# Patient Record
Sex: Female | Born: 1980
Health system: Southern US, Community
[De-identification: ages and names within clinical notes are randomized; demographics above are authoritative.]

## PROBLEM LIST (undated history)

## (undated) ENCOUNTER — Inpatient Hospital Stay (HOSPITAL_COMMUNITY): Payer: Self-pay

## (undated) DIAGNOSIS — J329 Chronic sinusitis, unspecified: Secondary | ICD-10-CM

## (undated) DIAGNOSIS — F319 Bipolar disorder, unspecified: Secondary | ICD-10-CM

## (undated) DIAGNOSIS — M1990 Unspecified osteoarthritis, unspecified site: Secondary | ICD-10-CM

## (undated) DIAGNOSIS — F419 Anxiety disorder, unspecified: Secondary | ICD-10-CM

## (undated) DIAGNOSIS — E039 Hypothyroidism, unspecified: Secondary | ICD-10-CM

## (undated) DIAGNOSIS — G5600 Carpal tunnel syndrome, unspecified upper limb: Secondary | ICD-10-CM

## (undated) DIAGNOSIS — Z3400 Encounter for supervision of normal first pregnancy, unspecified trimester: Secondary | ICD-10-CM

## (undated) DIAGNOSIS — F329 Major depressive disorder, single episode, unspecified: Secondary | ICD-10-CM

## (undated) DIAGNOSIS — K589 Irritable bowel syndrome without diarrhea: Secondary | ICD-10-CM

## (undated) DIAGNOSIS — K219 Gastro-esophageal reflux disease without esophagitis: Secondary | ICD-10-CM

## (undated) DIAGNOSIS — F32A Depression, unspecified: Secondary | ICD-10-CM

## (undated) HISTORY — PX: OTHER SURGICAL HISTORY: SHX169

---
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
## 2004-11-29 ENCOUNTER — Other Ambulatory Visit: Admission: RE | Admit: 2004-11-29 | Discharge: 2004-11-29 | Payer: Self-pay | Admitting: Internal Medicine

## 2005-11-02 ENCOUNTER — Other Ambulatory Visit: Admission: RE | Admit: 2005-11-02 | Discharge: 2005-11-02 | Payer: Self-pay | Admitting: Internal Medicine

## 2011-01-05 LAB — OB RESULTS CONSOLE ABO/RH

## 2011-01-05 LAB — OB RESULTS CONSOLE ANTIBODY SCREEN: Antibody Screen: NEGATIVE

## 2011-01-05 LAB — OB RESULTS CONSOLE HIV ANTIBODY (ROUTINE TESTING): HIV: NONREACTIVE

## 2011-01-05 LAB — OB RESULTS CONSOLE HEPATITIS B SURFACE ANTIGEN: Hepatitis B Surface Ag: NEGATIVE

## 2011-03-20 NOTE — L&D Delivery Note (Signed)
Delivery Note At 2:00 AM a viable female was delivered via Vaginal, Spontaneous Delivery (Presentation: OA;LOT ).  APGAR: 8, 9; weight 8 lb 3.6 oz (3731 g).   Placenta status: Intact, Spontaneous.  Cord: 3 vessels with the following complications: None.   Anesthesia: Local  Episiotomy: none Lacerations: B labial, 2nd degree perineal, B periurethral Suture Repair: 3.0 vicryl rapide Est. Blood Loss (mL): 500cc  Mom to postpartum.  Baby to nursery-stable.  [name],Stephanie Collazos 07/28/2011, 2:39 AM   A+ Samuella Cota

## 2011-04-06 ENCOUNTER — Ambulatory Visit (INDEPENDENT_AMBULATORY_CARE_PROVIDER_SITE_OTHER): Payer: BC Managed Care – PPO

## 2011-04-06 DIAGNOSIS — J019 Acute sinusitis, unspecified: Secondary | ICD-10-CM

## 2011-04-06 DIAGNOSIS — Z331 Pregnant state, incidental: Secondary | ICD-10-CM

## 2011-04-20 ENCOUNTER — Inpatient Hospital Stay (HOSPITAL_COMMUNITY)
Admission: AD | Admit: 2011-04-20 | Discharge: 2011-04-20 | Disposition: A | Discharge status: Home / Self Care | Payer: BC Managed Care – PPO | Source: Ambulatory Visit | Attending: Obstetrics and Gynecology | Admitting: Obstetrics and Gynecology

## 2011-04-20 ENCOUNTER — Encounter (HOSPITAL_COMMUNITY): Payer: Self-pay | Admitting: *Deleted

## 2011-04-20 DIAGNOSIS — R109 Unspecified abdominal pain: Secondary | ICD-10-CM | POA: Insufficient documentation

## 2011-04-20 DIAGNOSIS — N949 Unspecified condition associated with female genital organs and menstrual cycle: Secondary | ICD-10-CM

## 2011-04-20 DIAGNOSIS — O99891 Other specified diseases and conditions complicating pregnancy: Secondary | ICD-10-CM | POA: Insufficient documentation

## 2011-04-20 HISTORY — DX: Major depressive disorder, single episode, unspecified: F32.9

## 2011-04-20 HISTORY — DX: Gastro-esophageal reflux disease without esophagitis: K21.9

## 2011-04-20 HISTORY — DX: Anxiety disorder, unspecified: F41.9

## 2011-04-20 HISTORY — DX: Depression, unspecified: F32.A

## 2011-04-20 HISTORY — DX: Chronic sinusitis, unspecified: J32.9

## 2011-04-20 HISTORY — DX: Hypothyroidism, unspecified: E03.9

## 2011-04-20 HISTORY — DX: Bipolar disorder, unspecified: F31.9

## 2011-04-20 LAB — WET PREP, GENITAL
Trich, Wet Prep: NONE SEEN
Yeast Wet Prep HPF POC: NONE SEEN

## 2011-04-20 LAB — URINALYSIS, ROUTINE W REFLEX MICROSCOPIC
Bilirubin Urine: NEGATIVE
Glucose, UA: NEGATIVE mg/dL
Hgb urine dipstick: NEGATIVE
Protein, ur: NEGATIVE mg/dL

## 2011-04-20 NOTE — Progress Notes (Signed)
FEELS BABY MOVING 

## 2011-04-20 NOTE — Progress Notes (Signed)
25wks preg.  Past 3 days approx 1/4cup clear watery discharge.  Baby moving this morning, none since.  White mucous like d/c over weekend, clear since.

## 2011-04-20 NOTE — ED Provider Notes (Signed)
History     Chief Complaint  Patient presents with  . Vaginal Discharge   HPI Stephanie [name] is 30 y.o. G1P0 [redacted]w[redacted]d weeks presenting with clear fluid coming out for 3-4 days.  She reported this to her Dula 4 days ago and told it most likely for yeast because it had been white the day before.  Then called her doctor today when there was decreased fetal movement earlier today, sharp fleeting pains between the left mid abdomen and pubic bone. Denies vaginal bleeding.  + fetal movement now.      Past Medical History  Diagnosis Date  . Asthma   . Sinusitis   . Acid reflux   . Anxiety   . Depression   . Bipolar depression   . Hypothyroid     Past Surgical History  Procedure Date  . Extraction of wisdom teeth     Family History  Problem Relation Age of Onset  . Anesthesia problems Neg Hx     History  Substance Use Topics  . Smoking status: Former Smoker    Quit date: 04/19/1996  . Smokeless tobacco: Not on file  . Alcohol Use: No    Allergies:  Allergies  Allergen Reactions  . Codeine Other (See Comments)    Reaction: causes bipolar  . Morphine And Related     Reaction unknown    Prescriptions prior to admission  Medication Sig Dispense Refill  . albuterol (PROVENTIL HFA;VENTOLIN HFA) 108 (90 BASE) MCG/ACT inhaler Inhale 2 puffs into the lungs every 6 (six) hours as needed. Takes for shortness of breath      . cetirizine (ZYRTEC) 10 MG tablet Take 10 mg by mouth daily.      . Fluticasone-Salmeterol (ADVAIR DISKUS) 100-50 MCG/DOSE AEPB Inhale 1 puff into the lungs every 12 (twelve) hours.      Marland Kitchen levothyroxine (SYNTHROID, LEVOTHROID) 25 MCG tablet Take 25 mcg by mouth daily.      Marland Kitchen lurasidone (LATUDA) 80 MG TABS Take 80 mg by mouth at bedtime.      . Prenatal Vit-Fe Fumarate-FA (PRENATAL MULTIVITAMIN) TABS Take 1 tablet by mouth daily.      . Ranitidine HCl (ZANTAC PO) Take 1 tablet by mouth daily.        Review of Systems  Gastrointestinal: Positive for  abdominal pain.  Genitourinary:       Loss of clear fluid   Physical Exam   Blood pressure 106/46, pulse 86, temperature 98.4 F (36.9 C), temperature source Oral, resp. rate 20, height 5\' 3"  (1.6 m), weight 181 lb 3.2 oz (82.192 kg), SpO2 97.00%.  Physical Exam  Constitutional: She appears well-developed and well-nourished. No distress.  HENT:  Head: Normocephalic.  Neck: Normal range of motion.  Respiratory: Effort normal.  GI: Soft. There is no tenderness.  Genitourinary: Uterus is enlarged (gravid). Cervix exhibits no motion tenderness, no discharge and no friability. No bleeding around the vagina. Vaginal discharge (white creamy discharge without odor.  Without pooling of fluid. ) found.  Skin: Skin is warm and dry.  Psychiatric: She has a normal mood and affect. Her speech is normal and behavior is normal.   Results for orders placed during the hospital encounter of 04/20/11 (from the past 24 hour(s))  PREGNANCY, URINE     Status: Abnormal   Collection Time   04/20/11  6:50 PM      Component Value Range   Preg Test, Ur POSITIVE (*) NEGATIVE   URINALYSIS, ROUTINE W REFLEX MICROSCOPIC  Status: Abnormal   Collection Time   04/20/11  6:50 PM      Component Value Range   Color, Urine YELLOW  YELLOW    APPearance CLEAR  CLEAR    Specific Gravity, Urine <1.005 (*) 1.005 - 1.030    pH 6.5  5.0 - 8.0    Glucose, UA NEGATIVE  NEGATIVE (mg/dL)   Hgb urine dipstick NEGATIVE  NEGATIVE    Bilirubin Urine NEGATIVE  NEGATIVE    Ketones, ur 15 (*) NEGATIVE (mg/dL)   Protein, ur NEGATIVE  NEGATIVE (mg/dL)   Urobilinogen, UA 0.2  0.0 - 1.0 (mg/dL)   Nitrite NEGATIVE  NEGATIVE    Leukocytes, UA NEGATIVE  NEGATIVE   WET PREP, GENITAL     Status: Abnormal   Collection Time   04/20/11  7:34 PM      Component Value Range   Yeast Wet Prep HPF POC NONE SEEN  NONE SEEN    Trich, Wet Prep NONE SEEN  NONE SEEN    Clue Cells Wet Prep HPF POC NONE SEEN  NONE SEEN    WBC, Wet Prep HPF POC MANY  (*) NONE SEEN    FERN IS NEGATIVE  MAU Course  Procedures  MDM Consulted with Dr. Jackelyn Knife.  Reported MSE.  Order given to rule out rupture if negative reassure patient.   Assessment and Plan  A:  Round ligament pain      Negative for rupture of membranes  P:  Reassured.      Keep scheduled appointment.  Report worsening sxs to MD.  Matt Holmes 04/20/2011, 7:12 PM   Matt Holmes, NP 04/20/11 2007

## 2011-04-20 NOTE — Progress Notes (Signed)
Pain on left side, comes and goes

## 2011-07-27 ENCOUNTER — Inpatient Hospital Stay (HOSPITAL_COMMUNITY)
Admission: AD | Admit: 2011-07-27 | Discharge: 2011-07-30 | DRG: 373 | Disposition: A | Discharge status: Home / Self Care | Payer: BC Managed Care – PPO | Source: Ambulatory Visit | Attending: Obstetrics and Gynecology | Admitting: Obstetrics and Gynecology

## 2011-07-27 ENCOUNTER — Encounter (HOSPITAL_COMMUNITY): Payer: Self-pay | Admitting: *Deleted

## 2011-07-27 DIAGNOSIS — E039 Hypothyroidism, unspecified: Secondary | ICD-10-CM | POA: Diagnosis present

## 2011-07-27 DIAGNOSIS — O99284 Endocrine, nutritional and metabolic diseases complicating childbirth: Secondary | ICD-10-CM | POA: Diagnosis present

## 2011-07-27 DIAGNOSIS — Z3400 Encounter for supervision of normal first pregnancy, unspecified trimester: Secondary | ICD-10-CM

## 2011-07-27 DIAGNOSIS — E079 Disorder of thyroid, unspecified: Secondary | ICD-10-CM | POA: Diagnosis present

## 2011-07-27 HISTORY — DX: Encounter for supervision of normal first pregnancy, unspecified trimester: Z34.00

## 2011-07-27 HISTORY — DX: Unspecified osteoarthritis, unspecified site: M19.90

## 2011-07-27 HISTORY — DX: Irritable bowel syndrome, unspecified: K58.9

## 2011-07-27 HISTORY — DX: Carpal tunnel syndrome, unspecified upper limb: G56.00

## 2011-07-27 LAB — CBC
HCT: 35.9 % — ABNORMAL LOW (ref 36.0–46.0)
Hemoglobin: 11.9 g/dL — ABNORMAL LOW (ref 12.0–15.0)
MCV: 89.1 fL (ref 78.0–100.0)
Platelets: 185 10*3/uL (ref 150–400)
RBC: 4.03 MIL/uL (ref 3.87–5.11)
WBC: 10.4 10*3/uL (ref 4.0–10.5)

## 2011-07-27 MED ORDER — LACTATED RINGERS IV SOLN: 500.0000 mL | INTRAVENOUS | Status: DC | PRN

## 2011-07-27 MED ORDER — IBUPROFEN 600 MG PO TABS
600.0000 mg | ORAL_TABLET | Freq: Four times a day (QID) | ORAL | Status: DC | PRN
  Administered 2011-07-28: 600 mg via ORAL
  Filled 2011-07-27: qty 1

## 2011-07-27 MED ORDER — CITRIC ACID-SODIUM CITRATE 334-500 MG/5ML PO SOLN: 30.0000 mL | ORAL | Status: DC | PRN

## 2011-07-27 MED ORDER — LACTATED RINGERS IV SOLN
INTRAVENOUS | Status: DC
  Administered 2011-07-27: 21:00:00 via INTRAVENOUS

## 2011-07-27 MED ORDER — ACETAMINOPHEN 325 MG PO TABS: 650.0000 mg | ORAL_TABLET | ORAL | Status: DC | PRN

## 2011-07-27 MED ORDER — OXYTOCIN 20 UNITS IN LACTATED RINGERS INFUSION - SIMPLE
125.0000 mL/h | Freq: Once | INTRAVENOUS | Status: AC
  Administered 2011-07-28: 125 mL/h via INTRAVENOUS

## 2011-07-27 MED ORDER — LIDOCAINE HCL (PF) 1 % IJ SOLN
30.0000 mL | INTRAMUSCULAR | Status: DC | PRN
  Administered 2011-07-28: 30 mL via SUBCUTANEOUS
  Filled 2011-07-27: qty 30

## 2011-07-27 MED ORDER — OXYCODONE-ACETAMINOPHEN 5-325 MG PO TABS: 1.0000 | ORAL_TABLET | ORAL | Status: DC | PRN

## 2011-07-27 MED ORDER — ONDANSETRON HCL 4 MG/2ML IJ SOLN: 4.0000 mg | Freq: Four times a day (QID) | INTRAMUSCULAR | Status: DC | PRN

## 2011-07-27 MED ORDER — OXYTOCIN BOLUS FROM INFUSION
500.0000 mL | Freq: Once | INTRAVENOUS | Status: DC
  Filled 2011-07-27: qty 500
  Filled 2011-07-27: qty 1000

## 2011-07-27 MED ORDER — CITRIC ACID-SODIUM CITRATE 334-500 MG/5ML PO SOLN
ORAL | Status: AC
  Administered 2011-07-27: 30 mL
  Filled 2011-07-27: qty 15

## 2011-07-27 MED ORDER — BUTORPHANOL TARTRATE 2 MG/ML IJ SOLN: 1.0000 mg | Freq: Once | INTRAMUSCULAR | Status: DC

## 2011-07-27 MED ORDER — FLEET ENEMA 7-19 GM/118ML RE ENEM: 1.0000 | ENEMA | RECTAL | Status: DC | PRN

## 2011-07-27 NOTE — Plan of Care (Signed)
Problem: Consults Goal: Birthing Suites Patient Information Press F2 to bring up selections list Outcome: Completed/Met Date Met:  07/27/11  Pt 37-[redacted] weeks EGA  Comments:  39.0 wks

## 2011-07-27 NOTE — H&P (Signed)
Stephanie [name] is a 31 y.o. female G1P0 at 39+ in labor, +FM, no LOF, no VB, occ ctx; uncomplicated prenatal care except maternal hypothyroid and maternal bipolar.  GBBS negative.  Trying to do naturally, has doula and birth plan. Maternal Medical History:  Reason for admission: Reason for admission: contractions.  Contractions: Perceived severity is strong.    Fetal activity: Perceived fetal activity is normal.      OB History    Grav Para Term Preterm Abortions TAB SAB Ect Mult Living   1             G1 present, no abn pap, no STDs Past Medical History  Diagnosis Date  . Asthma   . Sinusitis   . Acid reflux   . Anxiety   . Depression   . Bipolar depression   . Hypothyroid   . IBS (irritable bowel syndrome)   . Arthritis   . Carpal tunnel syndrome   . Normal pregnancy, first 07/27/2011   Past Surgical History  Procedure Date  . Extraction of wisdom teeth   root canal  Family History: family history includes Alcohol abuse in her father, mother, and sister; Arthritis in her father, mother, and sister; Asthma in her father and mother; Cancer in her mother; Depression in her father, mother, and sister; Diabetes in her father and mother; Drug abuse in her father, mother, and sister; Heart disease in her father; Hypertension in her father; Kidney disease in her mother; and Mental illness in her father and mother.  There is no history of Anesthesia problems. Social History:  reports that she quit smoking about 15 years ago. She does not have any smokeless tobacco history on file. She reports that she does not drink alcohol or use illicit drugs. Meds PNV All NKDA  Review of Systems  Constitutional: Negative.   HENT: Negative.   Eyes: Negative.   Respiratory: Negative.   Cardiovascular: Negative.   Gastrointestinal: Negative.   Genitourinary: Negative.   Musculoskeletal: Negative.   Skin: Negative.   Neurological: Negative.   Psychiatric/Behavioral: Negative.      Dilation: 8 Effacement (%): 80 Station: -2 Exam by:: L.Paschal, RN Blood pressure 116/70, pulse 97, temperature 98.2 F (36.8 C), temperature source Oral, resp. rate 16, height 5' 2.75" (1.594 m), weight 93.895 kg (207 lb), SpO2 100.00%. Maternal Exam:  Abdomen: Fundal height is appropriate for gestation.   Fetal presentation: vertex     Physical Exam  Constitutional: She is oriented to person, place, and time. She appears well-developed and well-nourished.  HENT:  Head: Normocephalic and atraumatic.  Neck: Normal range of motion. Neck supple.  Cardiovascular: Normal rate and regular rhythm.   Respiratory: Effort normal and breath sounds normal. No respiratory distress.  GI: Soft. Bowel sounds are normal. There is no tenderness.  Musculoskeletal: Normal range of motion.  Neurological: She is alert and oriented to person, place, and time.  Skin: Skin is warm and dry.  Psychiatric: She has a normal mood and affect. Her behavior is normal.    Prenatal labs: ABO, Rh:  A + Antibody:  neg Rubella:  immune RPR:   nr HBsAg:   neg HIV:   neg GBS:   neg Hgb 12.2/ Pap WNL/ Plt 261K/ GC neg/ Chl neg/ First tri WNL/AFP WNL/ glucola 113/TSH WNL [place] nl anat, post plac, female, [facility] 5/17 TdAP 07/05/11, Flu  Assessment/Plan: 30yo G1P0 at 39+ in active labor, expect SVD Has doula, get IV access. T/c pitocin if no change gbbs  neg; expect SVD   BOVARD,Stephanie [name] 07/27/2011, 8:38 PM

## 2011-07-27 NOTE — MAU Note (Signed)
Pt states contractions started last night about 2000.  In the last hour they have become more frequent being every 2-6 min.  Denies vaginal bleeding or ROM.

## 2011-07-28 ENCOUNTER — Encounter (HOSPITAL_COMMUNITY): Payer: Self-pay | Admitting: Obstetrics and Gynecology

## 2011-07-28 LAB — CBC
HCT: 32.6 % — ABNORMAL LOW (ref 36.0–46.0)
MCH: 29.7 pg (ref 26.0–34.0)
MCHC: 33.1 g/dL (ref 30.0–36.0)
RDW: 14.6 % (ref 11.5–15.5)

## 2011-07-28 MED ORDER — PRENATAL MULTIVITAMIN CH
1.0000 | ORAL_TABLET | Freq: Every day | ORAL | Status: DC
  Administered 2011-07-29: 1 via ORAL
  Filled 2011-07-28: qty 1

## 2011-07-28 MED ORDER — OXYCODONE-ACETAMINOPHEN 5-325 MG PO TABS: 1.0000 | ORAL_TABLET | ORAL | Status: DC | PRN

## 2011-07-28 MED ORDER — IBUPROFEN 600 MG PO TABS
600.0000 mg | ORAL_TABLET | Freq: Four times a day (QID) | ORAL | Status: DC
  Administered 2011-07-28 – 2011-07-30 (×8): 600 mg via ORAL
  Filled 2011-07-28 (×7): qty 1

## 2011-07-28 MED ORDER — LEVOTHYROXINE SODIUM 25 MCG PO TABS
25.0000 ug | ORAL_TABLET | Freq: Every day | ORAL | Status: DC
  Administered 2011-07-28 – 2011-07-30 (×3): 25 ug via ORAL
  Filled 2011-07-28 (×3): qty 1

## 2011-07-28 MED ORDER — PRENATAL MULTIVITAMIN CH: 1.0000 | ORAL_TABLET | Freq: Every day | ORAL | Status: DC

## 2011-07-28 MED ORDER — LORATADINE 10 MG PO TABS
10.0000 mg | ORAL_TABLET | Freq: Every day | ORAL | Status: DC
  Administered 2011-07-28 – 2011-07-29 (×2): 10 mg via ORAL
  Filled 2011-07-28 (×3): qty 1

## 2011-07-28 MED ORDER — FAMOTIDINE 20 MG PO TABS
20.0000 mg | ORAL_TABLET | Freq: Every day | ORAL | Status: DC
  Filled 2011-07-28: qty 1

## 2011-07-28 MED ORDER — BENZOCAINE-MENTHOL 20-0.5 % EX AERO
1.0000 "application " | INHALATION_SPRAY | CUTANEOUS | Status: DC | PRN
  Filled 2011-07-28: qty 56

## 2011-07-28 MED ORDER — DIPHENHYDRAMINE HCL 25 MG PO CAPS: 25.0000 mg | ORAL_CAPSULE | Freq: Four times a day (QID) | ORAL | Status: DC | PRN

## 2011-07-28 MED ORDER — ONDANSETRON HCL 4 MG/2ML IJ SOLN: 4.0000 mg | INTRAMUSCULAR | Status: DC | PRN

## 2011-07-28 MED ORDER — SIMETHICONE 80 MG PO CHEW: 80.0000 mg | CHEWABLE_TABLET | ORAL | Status: DC | PRN

## 2011-07-28 MED ORDER — DIBUCAINE 1 % RE OINT: 1.0000 "application " | TOPICAL_OINTMENT | RECTAL | Status: DC | PRN

## 2011-07-28 MED ORDER — TETANUS-DIPHTH-ACELL PERTUSSIS 5-2.5-18.5 LF-MCG/0.5 IM SUSP: 0.5000 mL | Freq: Once | INTRAMUSCULAR | Status: DC

## 2011-07-28 MED ORDER — ONDANSETRON HCL 4 MG PO TABS: 4.0000 mg | ORAL_TABLET | ORAL | Status: DC | PRN

## 2011-07-28 MED ORDER — SENNOSIDES-DOCUSATE SODIUM 8.6-50 MG PO TABS
2.0000 | ORAL_TABLET | Freq: Every day | ORAL | Status: DC
  Administered 2011-07-29: 2 via ORAL

## 2011-07-28 MED ORDER — LANOLIN HYDROUS EX OINT: TOPICAL_OINTMENT | CUTANEOUS | Status: DC | PRN

## 2011-07-28 MED ORDER — ZOLPIDEM TARTRATE 5 MG PO TABS: 5.0000 mg | ORAL_TABLET | Freq: Every evening | ORAL | Status: DC | PRN

## 2011-07-28 MED ORDER — DOCUSATE SODIUM 100 MG PO CAPS
200.0000 mg | ORAL_CAPSULE | Freq: Every day | ORAL | Status: DC
  Administered 2011-07-28: 200 mg via ORAL
  Filled 2011-07-28 (×2): qty 1

## 2011-07-28 MED ORDER — ALBUTEROL SULFATE HFA 108 (90 BASE) MCG/ACT IN AERS
2.0000 | INHALATION_SPRAY | Freq: Four times a day (QID) | RESPIRATORY_TRACT | Status: DC | PRN
  Filled 2011-07-28: qty 6.7

## 2011-07-28 MED ORDER — LURASIDONE HCL 80 MG PO TABS
80.0000 mg | ORAL_TABLET | Freq: Every day | ORAL | Status: DC
  Administered 2011-07-28 – 2011-07-29 (×2): 80 mg via ORAL
  Filled 2011-07-28: qty 1

## 2011-07-28 MED ORDER — WITCH HAZEL-GLYCERIN EX PADS: 1.0000 "application " | MEDICATED_PAD | CUTANEOUS | Status: DC | PRN

## 2011-07-28 NOTE — Progress Notes (Signed)
Post Partum Day 0 Subjective: no complaints, up ad lib, tolerating PO and nl lochia, pain controlled  Objective: Blood pressure 119/80, pulse 115, temperature 98.7 F (37.1 C), temperature source Oral, resp. rate 20, height 5' 2.75" (1.594 m), weight 93.895 kg (207 lb), SpO2 96.00%, unknown if currently breastfeeding.  Physical Exam:  General: alert and no distress Lochia: appropriate Uterine Fundus: firm   Basename 07/28/11 0530 07/27/11 2035  HGB 10.8* 11.9*  HCT 32.6* 35.9*    Assessment/Plan: Plan for discharge tomorrow, Breastfeeding and Lactation consult   LOS: 1 day   BOVARD,Sherry Blackard 07/28/2011, 12:37 PM

## 2011-07-28 NOTE — Progress Notes (Signed)
Repair in progress

## 2011-07-29 ENCOUNTER — Encounter (HOSPITAL_COMMUNITY): Payer: Self-pay | Admitting: *Deleted

## 2011-07-29 NOTE — Clinical Social Work Maternal (Signed)
Clinical Social Work Department PSYCHOSOCIAL ASSESSMENT - MATERNAL/CHILD 07/29/2011  Patient:  Stephanie [name],Stephanie [name]  Account Number:  [id number]  Admit Date:  07/27/2011  Childs Name:   Stephanie [name]    Clinical Social Worker:  Mariapaula Krist, LCSW   Date/Time:  07/29/2011 03:30 PM  Date Referred:  07/29/2011   Referral source  Central Nursery     Referred reason  Depression/Anxiety   Other referral source:    I:  FAMILY / HOME ENVIRONMENT Child's legal guardian:  PARENT  Guardian - Name Guardian - Age Guardian - Address  Stephanie [name] 30 [address], [place], [place] [address]   Other household support members/support persons Name Relationship DOB  Josh Quiles FATHER    Other support:   Parents and friends.    II  PSYCHOSOCIAL DATA Information Source:  Patient Interview  Financial and Community Resources Employment:   [employer]   Financial resources:  Private Insurance If Medicaid - County:    School / Grade:   Maternity Care Coordinator / Child Services Coordination / Early Interventions:  Cultural issues impacting care:   None per pt.    III  STRENGTHS Strengths  Adequate Resources  Home prepared for Child (including basic supplies)  Supportive family/friends   Strength comment:    IV  RISK FACTORS AND CURRENT PROBLEMS Current Problem:  YES   Risk Factor & Current Problem Patient Issue Family Issue Risk Factor / Current Problem Comment  Mental Illness N N     V  SOCIAL WORK ASSESSMENT SW received referral due to pt history of anxiety, depression and bipolar.  Pt has been on medication since 1998.  She has a psychiatrist and a therapist.  She has a post-partum follow up appointment with Dr. Kaur, her psychiatrist, in two weeks.  Pt disclosed a long history of mental illness in her immediate family.  Pt is very aware of the signs and symptoms of her illness.  Her husband was present and also expressed his deep understanding of mental  illness.  The couple appeared very intuitive and were able to express their thoughts and feelings appropriately.    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  They have supplies and a good supportive network.  No d/c concerns at this time.      VI SOCIAL WORK PLAN Social Work Plan  No Further Intervention Required / No Barriers to Discharge   Type of pt/family education:   If child protective services report - county:   If child protective services report - date:   Information/referral to community resources comment:   Other social work plan:      

## 2011-07-30 MED ORDER — IBUPROFEN 800 MG PO TABS: 800.0000 mg | ORAL_TABLET | Freq: Three times a day (TID) | ORAL | Status: AC | PRN

## 2011-07-30 MED ORDER — PRENATAL MULTIVITAMIN CH: 1.0000 | ORAL_TABLET | Freq: Every day | ORAL | Status: DC

## 2011-07-30 NOTE — Discharge Summary (Signed)
Obstetric Discharge Summary Reason for Admission: onset of labor Prenatal Procedures: none Intrapartum Procedures: spontaneous vaginal delivery Postpartum Procedures: none Complications-Operative and Postpartum: 2nd degree perineal laceration and vaginal and labial laceration Hemoglobin  Date Value Range Status  07/28/2011 10.8* 12.0-15.0 (g/dL) Final     HCT  Date Value Range Status  07/28/2011 32.6* 36.0-46.0 (%) Final    Physical Exam:  General: alert and no distress Lochia: appropriate Uterine Fundus: firm   Discharge Diagnoses: Term Pregnancy-delivered  Discharge Information: Date: 07/30/2011 Activity: pelvic rest Diet: routine Medications: PNV and Ibuprofen Condition: stable Instructions: refer to practice specific booklet Discharge to: home Follow-up Information    Follow up with BOVARD,Raymir Frommelt, MD. Schedule an appointment as soon as possible for a visit in 6 weeks.   Contact information:   [address] [facility] [address] [place] [address] [phone]          Newborn Data: Live born female  Birth Weight: 8 lb 3.6 oz (3731 g) APGAR: 8, 9  Home with mother.  BOVARD,Zaryan Yakubov 07/30/2011, 8:08 AM

## 2011-07-30 NOTE — Progress Notes (Signed)
Post Partum Day 2 Subjective: no complaints, up ad lib, tolerating PO and nl lochia, pain controlled  Objective: Blood pressure 117/79, pulse 81, temperature 97.5 F (36.4 C), temperature source Oral, resp. rate 18, height 5' 2.75" (1.594 m), weight 93.895 kg (207 lb), SpO2 96.00%, unknown if currently breastfeeding.  Physical Exam:  General: alert and no distress Lochia: appropriate Uterine Fundus: firm    Basename 07/28/11 0530 07/27/11 2035  HGB 10.8* 11.9*  HCT 32.6* 35.9*    Assessment/Plan: Discharge home, Breastfeeding and Lactation consult.  D/c with motrin and pnv; f/u 6 weeks   LOS: 3 days   BOVARD,Osha Rane 07/30/2011, 8:04 AM

## 2011-07-31 ENCOUNTER — Encounter (HOSPITAL_COMMUNITY): Payer: Self-pay | Admitting: *Deleted

## 2012-11-04 ENCOUNTER — Ambulatory Visit: Payer: BC Managed Care – PPO | Attending: Family Medicine | Admitting: Physical Therapy

## 2012-11-04 DIAGNOSIS — M629 Disorder of muscle, unspecified: Secondary | ICD-10-CM | POA: Insufficient documentation

## 2012-11-04 DIAGNOSIS — IMO0001 Reserved for inherently not codable concepts without codable children: Secondary | ICD-10-CM | POA: Insufficient documentation

## 2012-11-04 DIAGNOSIS — M2420 Disorder of ligament, unspecified site: Secondary | ICD-10-CM | POA: Insufficient documentation

## 2012-11-18 ENCOUNTER — Ambulatory Visit: Payer: BC Managed Care – PPO | Attending: Family Medicine | Admitting: Physical Therapy

## 2012-11-18 DIAGNOSIS — M629 Disorder of muscle, unspecified: Secondary | ICD-10-CM | POA: Insufficient documentation

## 2012-11-18 DIAGNOSIS — M2420 Disorder of ligament, unspecified site: Secondary | ICD-10-CM | POA: Insufficient documentation

## 2012-11-18 DIAGNOSIS — IMO0001 Reserved for inherently not codable concepts without codable children: Secondary | ICD-10-CM | POA: Insufficient documentation

## 2012-12-02 ENCOUNTER — Ambulatory Visit: Payer: BC Managed Care – PPO | Admitting: Physical Therapy

## 2013-01-01 ENCOUNTER — Ambulatory Visit: Payer: BC Managed Care – PPO | Attending: Family Medicine | Admitting: Physical Therapy

## 2013-01-01 DIAGNOSIS — M2420 Disorder of ligament, unspecified site: Secondary | ICD-10-CM | POA: Insufficient documentation

## 2013-01-01 DIAGNOSIS — M629 Disorder of muscle, unspecified: Secondary | ICD-10-CM | POA: Insufficient documentation

## 2013-01-01 DIAGNOSIS — IMO0001 Reserved for inherently not codable concepts without codable children: Secondary | ICD-10-CM | POA: Insufficient documentation

## 2014-01-18 ENCOUNTER — Encounter (HOSPITAL_COMMUNITY): Payer: Self-pay | Admitting: *Deleted

## 2015-06-28 DIAGNOSIS — F3176 Bipolar disorder, in full remission, most recent episode depressed: Secondary | ICD-10-CM | POA: Diagnosis not present

## 2015-06-28 DIAGNOSIS — K5900 Constipation, unspecified: Secondary | ICD-10-CM | POA: Diagnosis not present

## 2015-06-28 DIAGNOSIS — R197 Diarrhea, unspecified: Secondary | ICD-10-CM | POA: Diagnosis not present

## 2015-06-28 DIAGNOSIS — K589 Irritable bowel syndrome without diarrhea: Secondary | ICD-10-CM | POA: Diagnosis not present

## 2015-07-02 DIAGNOSIS — F3181 Bipolar II disorder: Secondary | ICD-10-CM | POA: Diagnosis not present

## 2015-07-05 DIAGNOSIS — F3174 Bipolar disorder, in full remission, most recent episode manic: Secondary | ICD-10-CM | POA: Diagnosis not present

## 2015-07-05 DIAGNOSIS — F3176 Bipolar disorder, in full remission, most recent episode depressed: Secondary | ICD-10-CM | POA: Diagnosis not present

## 2015-07-19 DIAGNOSIS — H35052 Retinal neovascularization, unspecified, left eye: Secondary | ICD-10-CM | POA: Diagnosis not present

## 2015-07-19 DIAGNOSIS — H32 Chorioretinal disorders in diseases classified elsewhere: Secondary | ICD-10-CM | POA: Diagnosis not present

## 2015-07-19 DIAGNOSIS — H3562 Retinal hemorrhage, left eye: Secondary | ICD-10-CM | POA: Diagnosis not present

## 2015-07-23 DIAGNOSIS — F3181 Bipolar II disorder: Secondary | ICD-10-CM | POA: Diagnosis not present

## 2015-08-01 DIAGNOSIS — H35052 Retinal neovascularization, unspecified, left eye: Secondary | ICD-10-CM | POA: Diagnosis not present

## 2015-08-01 DIAGNOSIS — B399 Histoplasmosis, unspecified: Secondary | ICD-10-CM | POA: Diagnosis not present

## 2015-08-01 DIAGNOSIS — H3562 Retinal hemorrhage, left eye: Secondary | ICD-10-CM | POA: Diagnosis not present

## 2015-08-02 DIAGNOSIS — K219 Gastro-esophageal reflux disease without esophagitis: Secondary | ICD-10-CM | POA: Diagnosis not present

## 2015-08-02 DIAGNOSIS — R194 Change in bowel habit: Secondary | ICD-10-CM | POA: Diagnosis not present

## 2015-08-02 DIAGNOSIS — L409 Psoriasis, unspecified: Secondary | ICD-10-CM | POA: Diagnosis not present

## 2015-08-02 DIAGNOSIS — Z79899 Other long term (current) drug therapy: Secondary | ICD-10-CM | POA: Diagnosis not present

## 2015-08-02 DIAGNOSIS — K580 Irritable bowel syndrome with diarrhea: Secondary | ICD-10-CM | POA: Diagnosis not present

## 2015-08-02 DIAGNOSIS — R152 Fecal urgency: Secondary | ICD-10-CM | POA: Diagnosis not present

## 2015-08-20 DIAGNOSIS — F3181 Bipolar II disorder: Secondary | ICD-10-CM | POA: Diagnosis not present

## 2015-08-29 DIAGNOSIS — H3562 Retinal hemorrhage, left eye: Secondary | ICD-10-CM | POA: Diagnosis not present

## 2015-08-29 DIAGNOSIS — H35052 Retinal neovascularization, unspecified, left eye: Secondary | ICD-10-CM | POA: Diagnosis not present

## 2015-08-29 DIAGNOSIS — B399 Histoplasmosis, unspecified: Secondary | ICD-10-CM | POA: Diagnosis not present

## 2015-08-29 DIAGNOSIS — H32 Chorioretinal disorders in diseases classified elsewhere: Secondary | ICD-10-CM | POA: Diagnosis not present

## 2015-08-30 DIAGNOSIS — E669 Obesity, unspecified: Secondary | ICD-10-CM | POA: Diagnosis not present

## 2015-08-30 DIAGNOSIS — K580 Irritable bowel syndrome with diarrhea: Secondary | ICD-10-CM | POA: Diagnosis not present

## 2015-08-30 DIAGNOSIS — R152 Fecal urgency: Secondary | ICD-10-CM | POA: Diagnosis not present

## 2015-09-16 DIAGNOSIS — Z79899 Other long term (current) drug therapy: Secondary | ICD-10-CM | POA: Diagnosis not present

## 2015-09-16 DIAGNOSIS — F3176 Bipolar disorder, in full remission, most recent episode depressed: Secondary | ICD-10-CM | POA: Diagnosis not present

## 2015-09-16 DIAGNOSIS — L409 Psoriasis, unspecified: Secondary | ICD-10-CM | POA: Diagnosis not present

## 2015-09-24 DIAGNOSIS — F3181 Bipolar II disorder: Secondary | ICD-10-CM | POA: Diagnosis not present

## 2015-10-03 DIAGNOSIS — H35052 Retinal neovascularization, unspecified, left eye: Secondary | ICD-10-CM | POA: Diagnosis not present

## 2015-10-03 DIAGNOSIS — H3562 Retinal hemorrhage, left eye: Secondary | ICD-10-CM | POA: Diagnosis not present

## 2015-10-03 DIAGNOSIS — H32 Chorioretinal disorders in diseases classified elsewhere: Secondary | ICD-10-CM | POA: Diagnosis not present

## 2015-10-29 DIAGNOSIS — F3181 Bipolar II disorder: Secondary | ICD-10-CM | POA: Diagnosis not present

## 2015-10-31 DIAGNOSIS — B359 Dermatophytosis, unspecified: Secondary | ICD-10-CM | POA: Diagnosis not present

## 2015-10-31 DIAGNOSIS — H35052 Retinal neovascularization, unspecified, left eye: Secondary | ICD-10-CM | POA: Diagnosis not present

## 2015-10-31 DIAGNOSIS — H3562 Retinal hemorrhage, left eye: Secondary | ICD-10-CM | POA: Diagnosis not present

## 2015-10-31 DIAGNOSIS — H32 Chorioretinal disorders in diseases classified elsewhere: Secondary | ICD-10-CM | POA: Diagnosis not present

## 2015-11-12 DIAGNOSIS — F3181 Bipolar II disorder: Secondary | ICD-10-CM | POA: Diagnosis not present

## 2015-11-23 DIAGNOSIS — E039 Hypothyroidism, unspecified: Secondary | ICD-10-CM | POA: Diagnosis not present

## 2015-11-23 DIAGNOSIS — L739 Follicular disorder, unspecified: Secondary | ICD-10-CM | POA: Diagnosis not present

## 2015-11-23 DIAGNOSIS — M5442 Lumbago with sciatica, left side: Secondary | ICD-10-CM | POA: Diagnosis not present

## 2015-11-23 DIAGNOSIS — F319 Bipolar disorder, unspecified: Secondary | ICD-10-CM | POA: Diagnosis not present

## 2015-11-23 DIAGNOSIS — Z23 Encounter for immunization: Secondary | ICD-10-CM | POA: Diagnosis not present

## 2015-11-28 DIAGNOSIS — F3176 Bipolar disorder, in full remission, most recent episode depressed: Secondary | ICD-10-CM | POA: Diagnosis not present

## 2015-11-28 DIAGNOSIS — F3174 Bipolar disorder, in full remission, most recent episode manic: Secondary | ICD-10-CM | POA: Diagnosis not present

## 2015-12-12 DIAGNOSIS — B359 Dermatophytosis, unspecified: Secondary | ICD-10-CM | POA: Diagnosis not present

## 2015-12-12 DIAGNOSIS — H3562 Retinal hemorrhage, left eye: Secondary | ICD-10-CM | POA: Diagnosis not present

## 2015-12-12 DIAGNOSIS — H35052 Retinal neovascularization, unspecified, left eye: Secondary | ICD-10-CM | POA: Diagnosis not present

## 2015-12-12 DIAGNOSIS — H32 Chorioretinal disorders in diseases classified elsewhere: Secondary | ICD-10-CM | POA: Diagnosis not present

## 2015-12-24 DIAGNOSIS — F3181 Bipolar II disorder: Secondary | ICD-10-CM | POA: Diagnosis not present

## 2016-01-02 DIAGNOSIS — Z79899 Other long term (current) drug therapy: Secondary | ICD-10-CM | POA: Diagnosis not present

## 2016-01-02 DIAGNOSIS — L409 Psoriasis, unspecified: Secondary | ICD-10-CM | POA: Diagnosis not present

## 2016-01-02 DIAGNOSIS — R634 Abnormal weight loss: Secondary | ICD-10-CM | POA: Diagnosis not present

## 2016-01-20 DIAGNOSIS — Z136 Encounter for screening for cardiovascular disorders: Secondary | ICD-10-CM | POA: Diagnosis not present

## 2016-01-20 DIAGNOSIS — Z0000 Encounter for general adult medical examination without abnormal findings: Secondary | ICD-10-CM | POA: Diagnosis not present

## 2016-01-21 DIAGNOSIS — F3181 Bipolar II disorder: Secondary | ICD-10-CM | POA: Diagnosis not present

## 2016-01-24 DIAGNOSIS — R0981 Nasal congestion: Secondary | ICD-10-CM | POA: Diagnosis not present

## 2016-01-24 DIAGNOSIS — Z0000 Encounter for general adult medical examination without abnormal findings: Secondary | ICD-10-CM | POA: Diagnosis not present

## 2016-01-24 DIAGNOSIS — R6889 Other general symptoms and signs: Secondary | ICD-10-CM | POA: Diagnosis not present

## 2016-01-24 DIAGNOSIS — Z23 Encounter for immunization: Secondary | ICD-10-CM | POA: Diagnosis not present

## 2016-02-04 DIAGNOSIS — F3181 Bipolar II disorder: Secondary | ICD-10-CM | POA: Diagnosis not present

## 2016-02-17 DIAGNOSIS — F3174 Bipolar disorder, in full remission, most recent episode manic: Secondary | ICD-10-CM | POA: Diagnosis not present

## 2016-02-17 DIAGNOSIS — F3176 Bipolar disorder, in full remission, most recent episode depressed: Secondary | ICD-10-CM | POA: Diagnosis not present

## 2016-03-05 DIAGNOSIS — H3562 Retinal hemorrhage, left eye: Secondary | ICD-10-CM | POA: Diagnosis not present

## 2016-03-05 DIAGNOSIS — H31002 Unspecified chorioretinal scars, left eye: Secondary | ICD-10-CM | POA: Diagnosis not present

## 2016-03-05 DIAGNOSIS — H35022 Exudative retinopathy, left eye: Secondary | ICD-10-CM | POA: Diagnosis not present

## 2016-03-24 DIAGNOSIS — F3181 Bipolar II disorder: Secondary | ICD-10-CM | POA: Diagnosis not present

## 2016-03-26 DIAGNOSIS — R252 Cramp and spasm: Secondary | ICD-10-CM | POA: Diagnosis not present

## 2016-03-26 DIAGNOSIS — Z23 Encounter for immunization: Secondary | ICD-10-CM | POA: Diagnosis not present

## 2016-03-26 DIAGNOSIS — F319 Bipolar disorder, unspecified: Secondary | ICD-10-CM | POA: Diagnosis not present

## 2016-03-26 DIAGNOSIS — L739 Follicular disorder, unspecified: Secondary | ICD-10-CM | POA: Diagnosis not present

## 2016-03-26 DIAGNOSIS — J302 Other seasonal allergic rhinitis: Secondary | ICD-10-CM | POA: Diagnosis not present

## 2016-03-27 DIAGNOSIS — F3174 Bipolar disorder, in full remission, most recent episode manic: Secondary | ICD-10-CM | POA: Diagnosis not present

## 2016-03-27 DIAGNOSIS — F3176 Bipolar disorder, in full remission, most recent episode depressed: Secondary | ICD-10-CM | POA: Diagnosis not present

## 2016-03-28 DIAGNOSIS — D225 Melanocytic nevi of trunk: Secondary | ICD-10-CM | POA: Diagnosis not present

## 2016-03-28 DIAGNOSIS — Z86018 Personal history of other benign neoplasm: Secondary | ICD-10-CM | POA: Diagnosis not present

## 2016-03-28 DIAGNOSIS — D1800 Hemangioma unspecified site: Secondary | ICD-10-CM | POA: Diagnosis not present

## 2016-03-28 DIAGNOSIS — L821 Other seborrheic keratosis: Secondary | ICD-10-CM | POA: Diagnosis not present

## 2016-04-11 DIAGNOSIS — H35022 Exudative retinopathy, left eye: Secondary | ICD-10-CM | POA: Diagnosis not present

## 2016-04-11 DIAGNOSIS — H3562 Retinal hemorrhage, left eye: Secondary | ICD-10-CM | POA: Diagnosis not present

## 2016-04-11 DIAGNOSIS — B399 Histoplasmosis, unspecified: Secondary | ICD-10-CM | POA: Diagnosis not present

## 2016-04-11 DIAGNOSIS — H43393 Other vitreous opacities, bilateral: Secondary | ICD-10-CM | POA: Diagnosis not present

## 2016-04-11 DIAGNOSIS — H31002 Unspecified chorioretinal scars, left eye: Secondary | ICD-10-CM | POA: Diagnosis not present

## 2016-04-14 DIAGNOSIS — F3181 Bipolar II disorder: Secondary | ICD-10-CM | POA: Diagnosis not present

## 2016-04-23 DIAGNOSIS — H3562 Retinal hemorrhage, left eye: Secondary | ICD-10-CM | POA: Diagnosis not present

## 2016-04-23 DIAGNOSIS — H35022 Exudative retinopathy, left eye: Secondary | ICD-10-CM | POA: Diagnosis not present

## 2016-04-23 DIAGNOSIS — B399 Histoplasmosis, unspecified: Secondary | ICD-10-CM | POA: Diagnosis not present

## 2016-04-25 DIAGNOSIS — S91001D Unspecified open wound, right ankle, subsequent encounter: Secondary | ICD-10-CM | POA: Diagnosis not present

## 2016-05-04 DIAGNOSIS — F3176 Bipolar disorder, in full remission, most recent episode depressed: Secondary | ICD-10-CM | POA: Diagnosis not present

## 2016-05-04 DIAGNOSIS — F3174 Bipolar disorder, in full remission, most recent episode manic: Secondary | ICD-10-CM | POA: Diagnosis not present

## 2016-05-05 DIAGNOSIS — F3181 Bipolar II disorder: Secondary | ICD-10-CM | POA: Diagnosis not present

## 2016-05-21 DIAGNOSIS — Z79899 Other long term (current) drug therapy: Secondary | ICD-10-CM | POA: Diagnosis not present

## 2016-05-21 DIAGNOSIS — B399 Histoplasmosis, unspecified: Secondary | ICD-10-CM | POA: Diagnosis not present

## 2016-05-21 DIAGNOSIS — L4050 Arthropathic psoriasis, unspecified: Secondary | ICD-10-CM | POA: Diagnosis not present

## 2016-05-21 DIAGNOSIS — R7989 Other specified abnormal findings of blood chemistry: Secondary | ICD-10-CM | POA: Diagnosis not present

## 2016-05-21 DIAGNOSIS — H35022 Exudative retinopathy, left eye: Secondary | ICD-10-CM | POA: Diagnosis not present

## 2016-05-26 DIAGNOSIS — F3181 Bipolar II disorder: Secondary | ICD-10-CM | POA: Diagnosis not present

## 2016-05-31 DIAGNOSIS — R7989 Other specified abnormal findings of blood chemistry: Secondary | ICD-10-CM | POA: Diagnosis not present

## 2016-05-31 DIAGNOSIS — M2550 Pain in unspecified joint: Secondary | ICD-10-CM | POA: Diagnosis not present

## 2016-05-31 DIAGNOSIS — L409 Psoriasis, unspecified: Secondary | ICD-10-CM | POA: Diagnosis not present

## 2016-05-31 DIAGNOSIS — Z79899 Other long term (current) drug therapy: Secondary | ICD-10-CM | POA: Diagnosis not present

## 2016-06-01 DIAGNOSIS — Z6830 Body mass index (BMI) 30.0-30.9, adult: Secondary | ICD-10-CM | POA: Diagnosis not present

## 2016-06-01 DIAGNOSIS — L4050 Arthropathic psoriasis, unspecified: Secondary | ICD-10-CM | POA: Diagnosis not present

## 2016-06-01 DIAGNOSIS — E039 Hypothyroidism, unspecified: Secondary | ICD-10-CM | POA: Diagnosis not present

## 2016-06-01 DIAGNOSIS — J302 Other seasonal allergic rhinitis: Secondary | ICD-10-CM | POA: Diagnosis not present

## 2016-06-14 DIAGNOSIS — F3176 Bipolar disorder, in full remission, most recent episode depressed: Secondary | ICD-10-CM | POA: Diagnosis not present

## 2016-06-14 DIAGNOSIS — F3111 Bipolar disorder, current episode manic without psychotic features, mild: Secondary | ICD-10-CM | POA: Diagnosis not present

## 2016-06-16 DIAGNOSIS — F3181 Bipolar II disorder: Secondary | ICD-10-CM | POA: Diagnosis not present

## 2016-06-19 DIAGNOSIS — F3111 Bipolar disorder, current episode manic without psychotic features, mild: Secondary | ICD-10-CM | POA: Diagnosis not present

## 2016-06-25 DIAGNOSIS — B399 Histoplasmosis, unspecified: Secondary | ICD-10-CM | POA: Diagnosis not present

## 2016-06-25 DIAGNOSIS — H35022 Exudative retinopathy, left eye: Secondary | ICD-10-CM | POA: Diagnosis not present

## 2016-06-28 ENCOUNTER — Other Ambulatory Visit: Payer: Self-pay | Admitting: Family Medicine

## 2016-06-28 DIAGNOSIS — M5416 Radiculopathy, lumbar region: Secondary | ICD-10-CM

## 2016-06-28 DIAGNOSIS — M5442 Lumbago with sciatica, left side: Secondary | ICD-10-CM | POA: Diagnosis not present

## 2016-06-28 DIAGNOSIS — M21372 Foot drop, left foot: Secondary | ICD-10-CM

## 2016-06-28 DIAGNOSIS — L4050 Arthropathic psoriasis, unspecified: Secondary | ICD-10-CM | POA: Diagnosis not present

## 2016-07-03 ENCOUNTER — Other Ambulatory Visit: Payer: Self-pay | Admitting: Family Medicine

## 2016-07-03 ENCOUNTER — Ambulatory Visit
Admission: RE | Admit: 2016-07-03 | Discharge: 2016-07-03 | Disposition: A | Discharge status: Home / Self Care | Payer: BLUE CROSS/BLUE SHIELD | Source: Ambulatory Visit | Attending: Family Medicine | Admitting: Family Medicine

## 2016-07-03 DIAGNOSIS — M5416 Radiculopathy, lumbar region: Secondary | ICD-10-CM

## 2016-07-03 DIAGNOSIS — M21372 Foot drop, left foot: Secondary | ICD-10-CM | POA: Diagnosis not present

## 2016-07-03 DIAGNOSIS — M21379 Foot drop, unspecified foot: Secondary | ICD-10-CM

## 2016-07-07 DIAGNOSIS — F3181 Bipolar II disorder: Secondary | ICD-10-CM | POA: Diagnosis not present

## 2016-07-08 ENCOUNTER — Ambulatory Visit
Admission: RE | Admit: 2016-07-08 | Discharge: 2016-07-08 | Disposition: A | Discharge status: Home / Self Care | Payer: BLUE CROSS/BLUE SHIELD | Source: Ambulatory Visit | Attending: Family Medicine | Admitting: Family Medicine

## 2016-07-08 DIAGNOSIS — M5126 Other intervertebral disc displacement, lumbar region: Secondary | ICD-10-CM | POA: Diagnosis not present

## 2016-07-08 DIAGNOSIS — M21372 Foot drop, left foot: Secondary | ICD-10-CM

## 2016-07-08 DIAGNOSIS — M5416 Radiculopathy, lumbar region: Secondary | ICD-10-CM

## 2016-07-08 MED ORDER — GADOBENATE DIMEGLUMINE 529 MG/ML IV SOLN
15.0000 mL | Freq: Once | INTRAVENOUS | Status: AC | PRN
  Administered 2016-07-08: 15 mL via INTRAVENOUS

## 2016-07-12 DIAGNOSIS — M5442 Lumbago with sciatica, left side: Secondary | ICD-10-CM | POA: Diagnosis not present

## 2016-07-12 DIAGNOSIS — L409 Psoriasis, unspecified: Secondary | ICD-10-CM | POA: Diagnosis not present

## 2016-07-12 DIAGNOSIS — M7989 Other specified soft tissue disorders: Secondary | ICD-10-CM | POA: Diagnosis not present

## 2016-07-12 DIAGNOSIS — M2550 Pain in unspecified joint: Secondary | ICD-10-CM | POA: Diagnosis not present

## 2016-07-17 DIAGNOSIS — L4050 Arthropathic psoriasis, unspecified: Secondary | ICD-10-CM | POA: Diagnosis not present

## 2016-07-17 DIAGNOSIS — Z23 Encounter for immunization: Secondary | ICD-10-CM | POA: Diagnosis not present

## 2016-07-17 DIAGNOSIS — F319 Bipolar disorder, unspecified: Secondary | ICD-10-CM | POA: Diagnosis not present

## 2016-07-17 DIAGNOSIS — M5416 Radiculopathy, lumbar region: Secondary | ICD-10-CM | POA: Diagnosis not present

## 2016-07-24 DIAGNOSIS — H35022 Exudative retinopathy, left eye: Secondary | ICD-10-CM | POA: Diagnosis not present

## 2016-07-24 DIAGNOSIS — B399 Histoplasmosis, unspecified: Secondary | ICD-10-CM | POA: Diagnosis not present

## 2016-07-31 DIAGNOSIS — F3176 Bipolar disorder, in full remission, most recent episode depressed: Secondary | ICD-10-CM | POA: Diagnosis not present

## 2016-07-31 DIAGNOSIS — F3174 Bipolar disorder, in full remission, most recent episode manic: Secondary | ICD-10-CM | POA: Diagnosis not present

## 2016-08-02 DIAGNOSIS — L409 Psoriasis, unspecified: Secondary | ICD-10-CM | POA: Diagnosis not present

## 2016-08-02 DIAGNOSIS — L4050 Arthropathic psoriasis, unspecified: Secondary | ICD-10-CM | POA: Diagnosis not present

## 2016-08-02 DIAGNOSIS — M2550 Pain in unspecified joint: Secondary | ICD-10-CM | POA: Diagnosis not present

## 2016-08-04 DIAGNOSIS — F3181 Bipolar II disorder: Secondary | ICD-10-CM | POA: Diagnosis not present

## 2016-08-23 DIAGNOSIS — B399 Histoplasmosis, unspecified: Secondary | ICD-10-CM | POA: Diagnosis not present

## 2016-08-23 DIAGNOSIS — H35022 Exudative retinopathy, left eye: Secondary | ICD-10-CM | POA: Diagnosis not present

## 2016-09-08 DIAGNOSIS — F3181 Bipolar II disorder: Secondary | ICD-10-CM | POA: Diagnosis not present

## 2016-09-27 DIAGNOSIS — B399 Histoplasmosis, unspecified: Secondary | ICD-10-CM | POA: Diagnosis not present

## 2016-09-27 DIAGNOSIS — H35022 Exudative retinopathy, left eye: Secondary | ICD-10-CM | POA: Diagnosis not present

## 2016-10-01 DIAGNOSIS — E039 Hypothyroidism, unspecified: Secondary | ICD-10-CM | POA: Diagnosis not present

## 2016-10-01 DIAGNOSIS — R945 Abnormal results of liver function studies: Secondary | ICD-10-CM | POA: Diagnosis not present

## 2016-10-03 DIAGNOSIS — M5442 Lumbago with sciatica, left side: Secondary | ICD-10-CM | POA: Diagnosis not present

## 2016-10-03 DIAGNOSIS — M21372 Foot drop, left foot: Secondary | ICD-10-CM | POA: Diagnosis not present

## 2016-10-03 DIAGNOSIS — Z6830 Body mass index (BMI) 30.0-30.9, adult: Secondary | ICD-10-CM | POA: Diagnosis not present

## 2016-10-03 DIAGNOSIS — E039 Hypothyroidism, unspecified: Secondary | ICD-10-CM | POA: Diagnosis not present

## 2016-10-06 DIAGNOSIS — F3181 Bipolar II disorder: Secondary | ICD-10-CM | POA: Diagnosis not present

## 2016-10-23 DIAGNOSIS — Z1211 Encounter for screening for malignant neoplasm of colon: Secondary | ICD-10-CM | POA: Diagnosis not present

## 2016-10-23 DIAGNOSIS — R140 Abdominal distension (gaseous): Secondary | ICD-10-CM | POA: Diagnosis not present

## 2016-10-23 DIAGNOSIS — R194 Change in bowel habit: Secondary | ICD-10-CM | POA: Diagnosis not present

## 2016-10-23 DIAGNOSIS — K625 Hemorrhage of anus and rectum: Secondary | ICD-10-CM | POA: Diagnosis not present

## 2016-10-29 DIAGNOSIS — K529 Noninfective gastroenteritis and colitis, unspecified: Secondary | ICD-10-CM | POA: Diagnosis not present

## 2016-10-29 DIAGNOSIS — R194 Change in bowel habit: Secondary | ICD-10-CM | POA: Diagnosis not present

## 2016-10-29 DIAGNOSIS — K625 Hemorrhage of anus and rectum: Secondary | ICD-10-CM | POA: Diagnosis not present

## 2016-10-29 DIAGNOSIS — Z1211 Encounter for screening for malignant neoplasm of colon: Secondary | ICD-10-CM | POA: Diagnosis not present

## 2016-11-01 DIAGNOSIS — L4050 Arthropathic psoriasis, unspecified: Secondary | ICD-10-CM | POA: Diagnosis not present

## 2016-11-01 DIAGNOSIS — L409 Psoriasis, unspecified: Secondary | ICD-10-CM | POA: Diagnosis not present

## 2016-11-01 DIAGNOSIS — M2550 Pain in unspecified joint: Secondary | ICD-10-CM | POA: Diagnosis not present

## 2016-11-06 ENCOUNTER — Other Ambulatory Visit: Payer: Self-pay | Admitting: Gastroenterology

## 2016-11-06 DIAGNOSIS — R109 Unspecified abdominal pain: Secondary | ICD-10-CM

## 2016-11-06 NOTE — Progress Notes (Signed)
Kevin Space MD 

## 2016-11-10 DIAGNOSIS — F3181 Bipolar II disorder: Secondary | ICD-10-CM | POA: Diagnosis not present

## 2016-11-12 ENCOUNTER — Ambulatory Visit
Admission: RE | Admit: 2016-11-12 | Discharge: 2016-11-12 | Disposition: A | Discharge status: Home / Self Care | Payer: BLUE CROSS/BLUE SHIELD | Source: Ambulatory Visit | Attending: Gastroenterology | Admitting: Gastroenterology

## 2016-11-12 DIAGNOSIS — K6289 Other specified diseases of anus and rectum: Secondary | ICD-10-CM | POA: Diagnosis not present

## 2016-11-12 DIAGNOSIS — R109 Unspecified abdominal pain: Secondary | ICD-10-CM

## 2016-11-12 MED ORDER — IOPAMIDOL (ISOVUE-300) INJECTION 61%
100.0000 mL | Freq: Once | INTRAVENOUS | Status: AC | PRN
  Administered 2016-11-12: 100 mL via INTRAVENOUS

## 2016-11-21 DIAGNOSIS — B399 Histoplasmosis, unspecified: Secondary | ICD-10-CM | POA: Diagnosis not present

## 2016-11-21 DIAGNOSIS — H35022 Exudative retinopathy, left eye: Secondary | ICD-10-CM | POA: Diagnosis not present

## 2016-12-04 DIAGNOSIS — Z23 Encounter for immunization: Secondary | ICD-10-CM | POA: Diagnosis not present

## 2016-12-08 DIAGNOSIS — F603 Borderline personality disorder: Secondary | ICD-10-CM | POA: Diagnosis not present

## 2016-12-17 DIAGNOSIS — J069 Acute upper respiratory infection, unspecified: Secondary | ICD-10-CM | POA: Diagnosis not present

## 2016-12-17 DIAGNOSIS — Z6831 Body mass index (BMI) 31.0-31.9, adult: Secondary | ICD-10-CM | POA: Diagnosis not present

## 2016-12-24 DIAGNOSIS — H35022 Exudative retinopathy, left eye: Secondary | ICD-10-CM | POA: Diagnosis not present

## 2016-12-25 DIAGNOSIS — R05 Cough: Secondary | ICD-10-CM | POA: Diagnosis not present

## 2016-12-25 DIAGNOSIS — K219 Gastro-esophageal reflux disease without esophagitis: Secondary | ICD-10-CM | POA: Diagnosis not present

## 2016-12-25 DIAGNOSIS — J302 Other seasonal allergic rhinitis: Secondary | ICD-10-CM | POA: Diagnosis not present

## 2016-12-25 DIAGNOSIS — L4050 Arthropathic psoriasis, unspecified: Secondary | ICD-10-CM | POA: Diagnosis not present

## 2016-12-31 DIAGNOSIS — F3176 Bipolar disorder, in full remission, most recent episode depressed: Secondary | ICD-10-CM | POA: Diagnosis not present

## 2016-12-31 DIAGNOSIS — F3174 Bipolar disorder, in full remission, most recent episode manic: Secondary | ICD-10-CM | POA: Diagnosis not present

## 2017-01-04 ENCOUNTER — Ambulatory Visit
Admission: RE | Admit: 2017-01-04 | Discharge: 2017-01-04 | Disposition: A | Discharge status: Home / Self Care | Payer: BLUE CROSS/BLUE SHIELD | Source: Ambulatory Visit | Attending: Family Medicine | Admitting: Family Medicine

## 2017-01-04 ENCOUNTER — Other Ambulatory Visit: Payer: Self-pay | Admitting: Family Medicine

## 2017-01-04 DIAGNOSIS — R059 Cough, unspecified: Secondary | ICD-10-CM

## 2017-01-04 DIAGNOSIS — J302 Other seasonal allergic rhinitis: Secondary | ICD-10-CM | POA: Diagnosis not present

## 2017-01-04 DIAGNOSIS — Z6831 Body mass index (BMI) 31.0-31.9, adult: Secondary | ICD-10-CM | POA: Diagnosis not present

## 2017-01-04 DIAGNOSIS — F319 Bipolar disorder, unspecified: Secondary | ICD-10-CM | POA: Diagnosis not present

## 2017-01-04 DIAGNOSIS — R05 Cough: Secondary | ICD-10-CM

## 2017-01-14 DIAGNOSIS — Z6831 Body mass index (BMI) 31.0-31.9, adult: Secondary | ICD-10-CM | POA: Diagnosis not present

## 2017-01-14 DIAGNOSIS — K219 Gastro-esophageal reflux disease without esophagitis: Secondary | ICD-10-CM | POA: Diagnosis not present

## 2017-01-14 DIAGNOSIS — R05 Cough: Secondary | ICD-10-CM | POA: Diagnosis not present

## 2017-01-21 DIAGNOSIS — Z0000 Encounter for general adult medical examination without abnormal findings: Secondary | ICD-10-CM | POA: Diagnosis not present

## 2017-01-21 DIAGNOSIS — Z1329 Encounter for screening for other suspected endocrine disorder: Secondary | ICD-10-CM | POA: Diagnosis not present

## 2017-01-21 DIAGNOSIS — Z13220 Encounter for screening for lipoid disorders: Secondary | ICD-10-CM | POA: Diagnosis not present

## 2017-01-21 DIAGNOSIS — Z114 Encounter for screening for human immunodeficiency virus [HIV]: Secondary | ICD-10-CM | POA: Diagnosis not present

## 2017-01-25 DIAGNOSIS — Z6831 Body mass index (BMI) 31.0-31.9, adult: Secondary | ICD-10-CM | POA: Diagnosis not present

## 2017-01-25 DIAGNOSIS — Z23 Encounter for immunization: Secondary | ICD-10-CM | POA: Diagnosis not present

## 2017-01-25 DIAGNOSIS — Z0000 Encounter for general adult medical examination without abnormal findings: Secondary | ICD-10-CM | POA: Diagnosis not present

## 2017-01-28 DIAGNOSIS — H35022 Exudative retinopathy, left eye: Secondary | ICD-10-CM | POA: Diagnosis not present

## 2017-02-01 DIAGNOSIS — M2550 Pain in unspecified joint: Secondary | ICD-10-CM | POA: Diagnosis not present

## 2017-02-01 DIAGNOSIS — L409 Psoriasis, unspecified: Secondary | ICD-10-CM | POA: Diagnosis not present

## 2017-02-01 DIAGNOSIS — L4050 Arthropathic psoriasis, unspecified: Secondary | ICD-10-CM | POA: Diagnosis not present

## 2017-02-02 DIAGNOSIS — F3181 Bipolar II disorder: Secondary | ICD-10-CM | POA: Diagnosis not present

## 2017-02-28 DIAGNOSIS — H35022 Exudative retinopathy, left eye: Secondary | ICD-10-CM | POA: Diagnosis not present

## 2017-03-16 DIAGNOSIS — F3181 Bipolar II disorder: Secondary | ICD-10-CM | POA: Diagnosis not present

## 2017-03-21 DIAGNOSIS — H35022 Exudative retinopathy, left eye: Secondary | ICD-10-CM | POA: Diagnosis not present

## 2017-04-01 DIAGNOSIS — D225 Melanocytic nevi of trunk: Secondary | ICD-10-CM | POA: Diagnosis not present

## 2017-04-01 DIAGNOSIS — L821 Other seborrheic keratosis: Secondary | ICD-10-CM | POA: Diagnosis not present

## 2017-04-01 DIAGNOSIS — Z86018 Personal history of other benign neoplasm: Secondary | ICD-10-CM | POA: Diagnosis not present

## 2017-04-01 DIAGNOSIS — D1800 Hemangioma unspecified site: Secondary | ICD-10-CM | POA: Diagnosis not present

## 2017-04-08 DIAGNOSIS — H35022 Exudative retinopathy, left eye: Secondary | ICD-10-CM | POA: Diagnosis not present

## 2017-04-10 DIAGNOSIS — Z6831 Body mass index (BMI) 31.0-31.9, adult: Secondary | ICD-10-CM | POA: Diagnosis not present

## 2017-04-10 DIAGNOSIS — J069 Acute upper respiratory infection, unspecified: Secondary | ICD-10-CM | POA: Diagnosis not present

## 2017-04-12 DIAGNOSIS — Z6831 Body mass index (BMI) 31.0-31.9, adult: Secondary | ICD-10-CM | POA: Diagnosis not present

## 2017-04-12 DIAGNOSIS — J018 Other acute sinusitis: Secondary | ICD-10-CM | POA: Diagnosis not present

## 2017-04-12 DIAGNOSIS — H6591 Unspecified nonsuppurative otitis media, right ear: Secondary | ICD-10-CM | POA: Diagnosis not present

## 2017-04-12 DIAGNOSIS — J069 Acute upper respiratory infection, unspecified: Secondary | ICD-10-CM | POA: Diagnosis not present

## 2017-04-12 DIAGNOSIS — H6690 Otitis media, unspecified, unspecified ear: Secondary | ICD-10-CM | POA: Diagnosis not present

## 2017-04-18 DIAGNOSIS — J069 Acute upper respiratory infection, unspecified: Secondary | ICD-10-CM | POA: Diagnosis not present

## 2017-04-18 DIAGNOSIS — J019 Acute sinusitis, unspecified: Secondary | ICD-10-CM | POA: Diagnosis not present

## 2017-04-18 DIAGNOSIS — Z6831 Body mass index (BMI) 31.0-31.9, adult: Secondary | ICD-10-CM | POA: Diagnosis not present

## 2017-04-26 DIAGNOSIS — Z13220 Encounter for screening for lipoid disorders: Secondary | ICD-10-CM | POA: Diagnosis not present

## 2017-04-27 DIAGNOSIS — F3181 Bipolar II disorder: Secondary | ICD-10-CM | POA: Diagnosis not present

## 2017-04-29 DIAGNOSIS — R899 Unspecified abnormal finding in specimens from other organs, systems and tissues: Secondary | ICD-10-CM | POA: Diagnosis not present

## 2017-04-29 DIAGNOSIS — Z13220 Encounter for screening for lipoid disorders: Secondary | ICD-10-CM | POA: Diagnosis not present

## 2017-04-29 DIAGNOSIS — R05 Cough: Secondary | ICD-10-CM | POA: Diagnosis not present

## 2017-04-29 DIAGNOSIS — K219 Gastro-esophageal reflux disease without esophagitis: Secondary | ICD-10-CM | POA: Diagnosis not present

## 2017-04-29 DIAGNOSIS — Z6831 Body mass index (BMI) 31.0-31.9, adult: Secondary | ICD-10-CM | POA: Diagnosis not present

## 2017-05-07 DIAGNOSIS — F3176 Bipolar disorder, in full remission, most recent episode depressed: Secondary | ICD-10-CM | POA: Diagnosis not present

## 2017-05-07 DIAGNOSIS — L409 Psoriasis, unspecified: Secondary | ICD-10-CM | POA: Diagnosis not present

## 2017-05-07 DIAGNOSIS — L4050 Arthropathic psoriasis, unspecified: Secondary | ICD-10-CM | POA: Diagnosis not present

## 2017-05-07 DIAGNOSIS — J069 Acute upper respiratory infection, unspecified: Secondary | ICD-10-CM | POA: Diagnosis not present

## 2017-05-15 DIAGNOSIS — H35022 Exudative retinopathy, left eye: Secondary | ICD-10-CM | POA: Diagnosis not present

## 2017-05-18 DIAGNOSIS — F3181 Bipolar II disorder: Secondary | ICD-10-CM | POA: Diagnosis not present

## 2017-05-27 DIAGNOSIS — Z30432 Encounter for removal of intrauterine contraceptive device: Secondary | ICD-10-CM | POA: Diagnosis not present

## 2017-05-28 ENCOUNTER — Other Ambulatory Visit: Payer: Self-pay | Admitting: Family Medicine

## 2017-05-28 DIAGNOSIS — Z975 Presence of (intrauterine) contraceptive device: Secondary | ICD-10-CM

## 2017-06-05 ENCOUNTER — Ambulatory Visit
Admission: RE | Admit: 2017-06-05 | Discharge: 2017-06-05 | Disposition: A | Discharge status: Home / Self Care | Payer: BLUE CROSS/BLUE SHIELD | Source: Ambulatory Visit | Attending: Family Medicine | Admitting: Family Medicine

## 2017-06-05 DIAGNOSIS — Z975 Presence of (intrauterine) contraceptive device: Secondary | ICD-10-CM

## 2017-06-05 DIAGNOSIS — Z30430 Encounter for insertion of intrauterine contraceptive device: Secondary | ICD-10-CM | POA: Diagnosis not present

## 2017-06-17 DIAGNOSIS — H35022 Exudative retinopathy, left eye: Secondary | ICD-10-CM | POA: Diagnosis not present

## 2017-06-24 DIAGNOSIS — F3174 Bipolar disorder, in full remission, most recent episode manic: Secondary | ICD-10-CM | POA: Diagnosis not present

## 2017-06-24 DIAGNOSIS — F3176 Bipolar disorder, in full remission, most recent episode depressed: Secondary | ICD-10-CM | POA: Diagnosis not present

## 2017-07-06 DIAGNOSIS — F3181 Bipolar II disorder: Secondary | ICD-10-CM | POA: Diagnosis not present

## 2017-07-15 DIAGNOSIS — H35022 Exudative retinopathy, left eye: Secondary | ICD-10-CM | POA: Diagnosis not present

## 2017-07-22 DIAGNOSIS — E039 Hypothyroidism, unspecified: Secondary | ICD-10-CM | POA: Diagnosis not present

## 2017-07-23 DIAGNOSIS — Z30433 Encounter for removal and reinsertion of intrauterine contraceptive device: Secondary | ICD-10-CM | POA: Diagnosis not present

## 2017-07-24 DIAGNOSIS — E039 Hypothyroidism, unspecified: Secondary | ICD-10-CM | POA: Diagnosis not present

## 2017-07-24 DIAGNOSIS — L4050 Arthropathic psoriasis, unspecified: Secondary | ICD-10-CM | POA: Diagnosis not present

## 2017-07-24 DIAGNOSIS — F319 Bipolar disorder, unspecified: Secondary | ICD-10-CM | POA: Diagnosis not present

## 2017-07-24 DIAGNOSIS — M722 Plantar fascial fibromatosis: Secondary | ICD-10-CM | POA: Diagnosis not present

## 2017-08-06 DIAGNOSIS — L4050 Arthropathic psoriasis, unspecified: Secondary | ICD-10-CM | POA: Diagnosis not present

## 2017-08-06 DIAGNOSIS — L409 Psoriasis, unspecified: Secondary | ICD-10-CM | POA: Diagnosis not present

## 2017-08-08 DIAGNOSIS — H35022 Exudative retinopathy, left eye: Secondary | ICD-10-CM | POA: Diagnosis not present

## 2017-08-17 DIAGNOSIS — F3181 Bipolar II disorder: Secondary | ICD-10-CM | POA: Diagnosis not present

## 2017-08-19 DIAGNOSIS — Z30433 Encounter for removal and reinsertion of intrauterine contraceptive device: Secondary | ICD-10-CM | POA: Diagnosis not present

## 2017-08-19 DIAGNOSIS — T8331XD Breakdown (mechanical) of intrauterine contraceptive device, subsequent encounter: Secondary | ICD-10-CM | POA: Diagnosis not present

## 2017-08-30 DIAGNOSIS — F3181 Bipolar II disorder: Secondary | ICD-10-CM | POA: Diagnosis not present

## 2017-09-11 DIAGNOSIS — H35022 Exudative retinopathy, left eye: Secondary | ICD-10-CM | POA: Diagnosis not present

## 2017-09-14 DIAGNOSIS — F3181 Bipolar II disorder: Secondary | ICD-10-CM | POA: Diagnosis not present

## 2017-10-12 DIAGNOSIS — F3181 Bipolar II disorder: Secondary | ICD-10-CM | POA: Diagnosis not present

## 2017-10-14 DIAGNOSIS — H35022 Exudative retinopathy, left eye: Secondary | ICD-10-CM | POA: Diagnosis not present

## 2017-10-21 DIAGNOSIS — Z30431 Encounter for routine checking of intrauterine contraceptive device: Secondary | ICD-10-CM | POA: Diagnosis not present

## 2017-11-06 DIAGNOSIS — L4050 Arthropathic psoriasis, unspecified: Secondary | ICD-10-CM | POA: Diagnosis not present

## 2017-11-06 DIAGNOSIS — L409 Psoriasis, unspecified: Secondary | ICD-10-CM | POA: Diagnosis not present

## 2017-11-06 DIAGNOSIS — R232 Flushing: Secondary | ICD-10-CM | POA: Diagnosis not present

## 2017-11-09 DIAGNOSIS — F3181 Bipolar II disorder: Secondary | ICD-10-CM | POA: Diagnosis not present

## 2017-11-20 DIAGNOSIS — H35022 Exudative retinopathy, left eye: Secondary | ICD-10-CM | POA: Diagnosis not present

## 2017-11-27 DIAGNOSIS — Z23 Encounter for immunization: Secondary | ICD-10-CM | POA: Diagnosis not present

## 2017-12-07 DIAGNOSIS — F3181 Bipolar II disorder: Secondary | ICD-10-CM | POA: Diagnosis not present

## 2017-12-18 DIAGNOSIS — F3176 Bipolar disorder, in full remission, most recent episode depressed: Secondary | ICD-10-CM | POA: Diagnosis not present

## 2017-12-18 DIAGNOSIS — F410 Panic disorder [episodic paroxysmal anxiety] without agoraphobia: Secondary | ICD-10-CM | POA: Diagnosis not present

## 2017-12-18 DIAGNOSIS — F3174 Bipolar disorder, in full remission, most recent episode manic: Secondary | ICD-10-CM | POA: Diagnosis not present

## 2017-12-25 DIAGNOSIS — H35022 Exudative retinopathy, left eye: Secondary | ICD-10-CM | POA: Diagnosis not present

## 2017-12-28 DIAGNOSIS — F3181 Bipolar II disorder: Secondary | ICD-10-CM | POA: Diagnosis not present

## 2018-01-27 DIAGNOSIS — H35022 Exudative retinopathy, left eye: Secondary | ICD-10-CM | POA: Diagnosis not present

## 2018-01-28 DIAGNOSIS — Z1329 Encounter for screening for other suspected endocrine disorder: Secondary | ICD-10-CM | POA: Diagnosis not present

## 2018-01-28 DIAGNOSIS — Z13220 Encounter for screening for lipoid disorders: Secondary | ICD-10-CM | POA: Diagnosis not present

## 2018-01-28 DIAGNOSIS — Z114 Encounter for screening for human immunodeficiency virus [HIV]: Secondary | ICD-10-CM | POA: Diagnosis not present

## 2018-01-28 DIAGNOSIS — Z0000 Encounter for general adult medical examination without abnormal findings: Secondary | ICD-10-CM | POA: Diagnosis not present

## 2018-02-01 DIAGNOSIS — F3181 Bipolar II disorder: Secondary | ICD-10-CM | POA: Diagnosis not present

## 2018-02-03 DIAGNOSIS — Z0000 Encounter for general adult medical examination without abnormal findings: Secondary | ICD-10-CM | POA: Diagnosis not present

## 2018-02-03 DIAGNOSIS — Z6829 Body mass index (BMI) 29.0-29.9, adult: Secondary | ICD-10-CM | POA: Diagnosis not present

## 2018-02-19 DIAGNOSIS — F3176 Bipolar disorder, in full remission, most recent episode depressed: Secondary | ICD-10-CM | POA: Diagnosis not present

## 2018-02-19 DIAGNOSIS — F3174 Bipolar disorder, in full remission, most recent episode manic: Secondary | ICD-10-CM | POA: Diagnosis not present

## 2018-02-19 DIAGNOSIS — F411 Generalized anxiety disorder: Secondary | ICD-10-CM | POA: Diagnosis not present

## 2018-03-01 DIAGNOSIS — F3181 Bipolar II disorder: Secondary | ICD-10-CM | POA: Diagnosis not present

## 2018-03-06 DIAGNOSIS — R300 Dysuria: Secondary | ICD-10-CM | POA: Diagnosis not present

## 2018-03-06 DIAGNOSIS — Z6828 Body mass index (BMI) 28.0-28.9, adult: Secondary | ICD-10-CM | POA: Diagnosis not present

## 2018-03-06 DIAGNOSIS — R350 Frequency of micturition: Secondary | ICD-10-CM | POA: Diagnosis not present

## 2018-03-10 DIAGNOSIS — H35022 Exudative retinopathy, left eye: Secondary | ICD-10-CM | POA: Diagnosis not present

## 2018-03-29 DIAGNOSIS — F411 Generalized anxiety disorder: Secondary | ICD-10-CM | POA: Diagnosis not present

## 2018-04-09 DIAGNOSIS — F3176 Bipolar disorder, in full remission, most recent episode depressed: Secondary | ICD-10-CM | POA: Diagnosis not present

## 2018-04-09 DIAGNOSIS — F3174 Bipolar disorder, in full remission, most recent episode manic: Secondary | ICD-10-CM | POA: Diagnosis not present

## 2018-04-23 DIAGNOSIS — H35022 Exudative retinopathy, left eye: Secondary | ICD-10-CM | POA: Diagnosis not present

## 2018-05-26 DIAGNOSIS — H109 Unspecified conjunctivitis: Secondary | ICD-10-CM | POA: Diagnosis not present

## 2018-05-26 DIAGNOSIS — J069 Acute upper respiratory infection, unspecified: Secondary | ICD-10-CM | POA: Diagnosis not present

## 2018-05-26 DIAGNOSIS — Z6826 Body mass index (BMI) 26.0-26.9, adult: Secondary | ICD-10-CM | POA: Diagnosis not present

## 2018-05-30 DIAGNOSIS — R197 Diarrhea, unspecified: Secondary | ICD-10-CM | POA: Diagnosis not present

## 2018-05-30 DIAGNOSIS — J069 Acute upper respiratory infection, unspecified: Secondary | ICD-10-CM | POA: Diagnosis not present

## 2018-05-30 DIAGNOSIS — L4050 Arthropathic psoriasis, unspecified: Secondary | ICD-10-CM | POA: Diagnosis not present

## 2018-05-30 DIAGNOSIS — L409 Psoriasis, unspecified: Secondary | ICD-10-CM | POA: Diagnosis not present

## 2018-06-02 DIAGNOSIS — H35022 Exudative retinopathy, left eye: Secondary | ICD-10-CM | POA: Diagnosis not present

## 2018-06-09 DIAGNOSIS — J309 Allergic rhinitis, unspecified: Secondary | ICD-10-CM | POA: Diagnosis not present

## 2018-06-09 DIAGNOSIS — Z719 Counseling, unspecified: Secondary | ICD-10-CM | POA: Diagnosis not present

## 2018-06-09 DIAGNOSIS — R05 Cough: Secondary | ICD-10-CM | POA: Diagnosis not present

## 2018-06-21 DIAGNOSIS — F3181 Bipolar II disorder: Secondary | ICD-10-CM | POA: Diagnosis not present

## 2018-07-10 DIAGNOSIS — H35022 Exudative retinopathy, left eye: Secondary | ICD-10-CM | POA: Diagnosis not present

## 2018-07-23 IMAGING — MR MR LUMBAR SPINE WO/W CM
7 series · 47 of 48 positions shown · IV contrast (multihance)
Comparison: Plain films lumbar spine 07/03/2016.

CLINICAL DATA: Left foot drop and inability to move the toes of the
left foot since March 2016.

EXAM:
MRI LUMBAR SPINE WITHOUT AND WITH CONTRAST
TECHNIQUE: Multiplanar and multiecho pulse sequences of the lumbar spine were
obtained without and with intravenous contrast.
CONTRAST:  15 ml MULTIHANCE GADOBENATE DIMEGLUMINE 529 MG/ML IV SOLN

[Series 3: T1 · sagittal · 4.0mm · 0.94mm/px · 5 of 13 slices shown (1 of 2)]
[im 1/13]
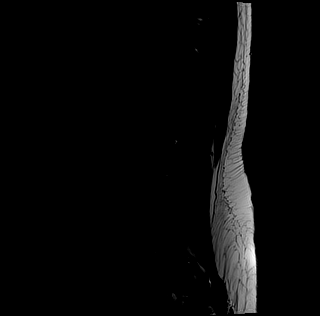
[im 4/13]
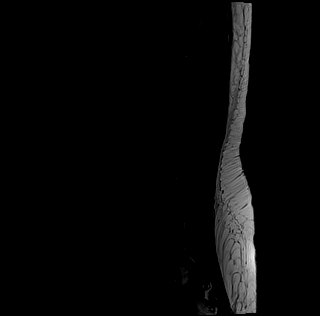
[im 7/13]
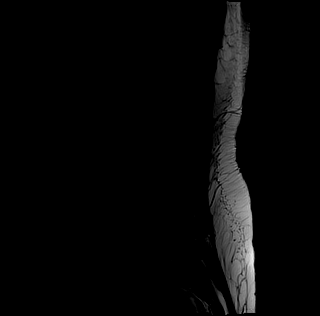
[im 10/13]
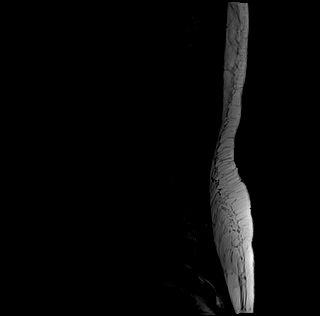
[im 13/13]
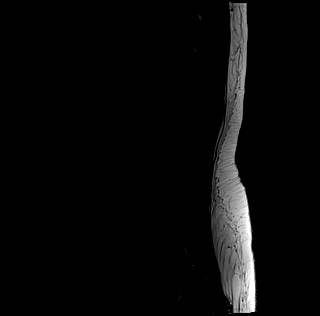

[Series 4: tirm sag · sagittal · 4.0mm · 0.59mm/px · 5 of 13 slices shown]
[im 1/13]
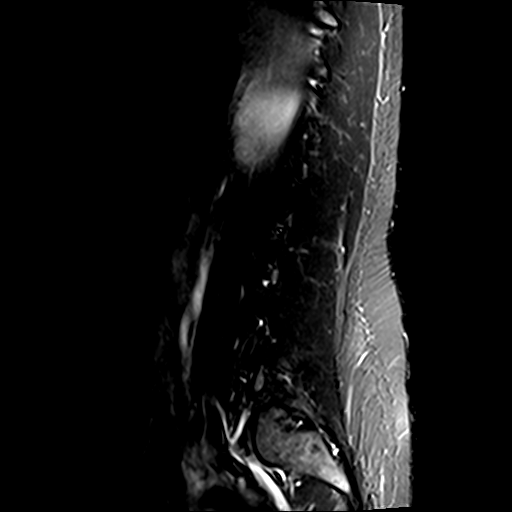
[im 4/13]
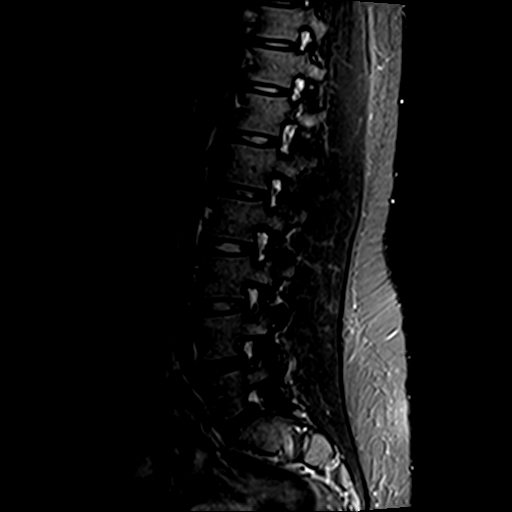
[im 7/13]
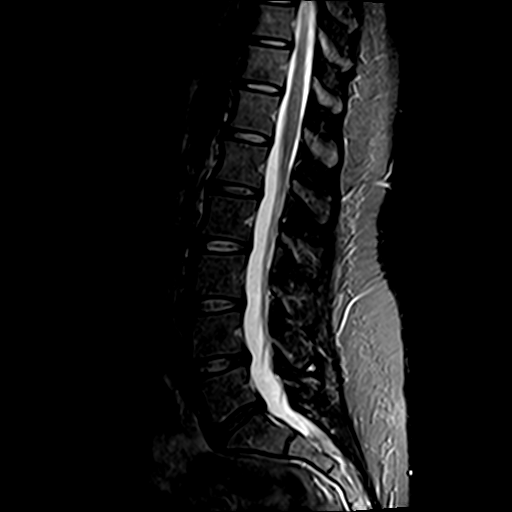
[im 10/13]
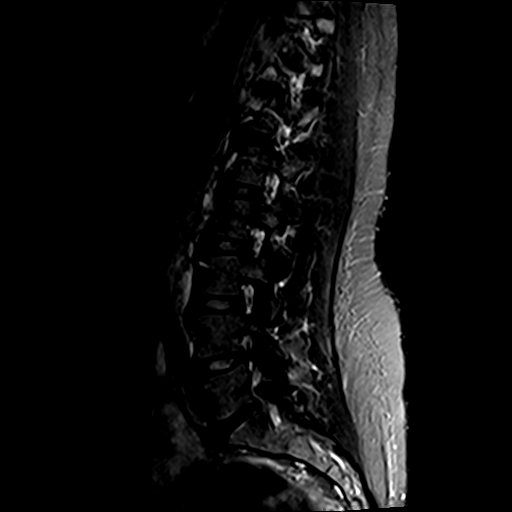
[im 13/13]
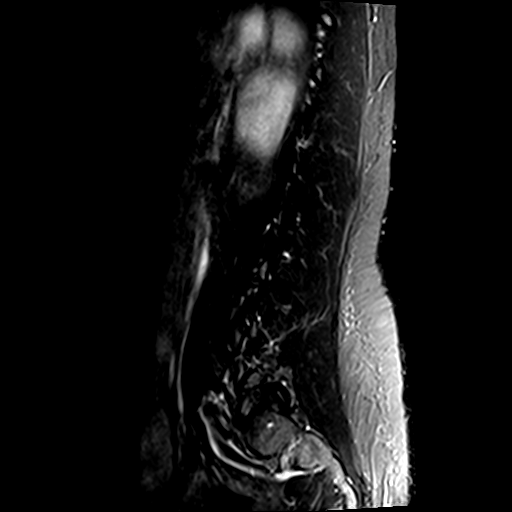

[Series 5: T1 · axial · 4.0mm · 0.78mm/px · z∈[-104,+73]mm · 10 of 33 slices shown (2 of 2)]
[im 1/33]
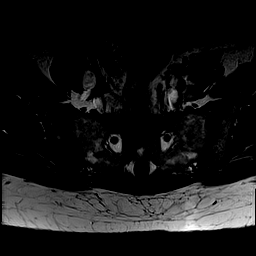
[im 4/33]
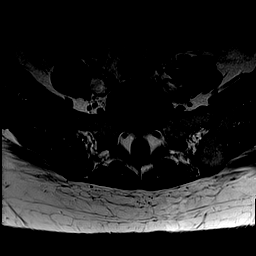
[im 8/33]
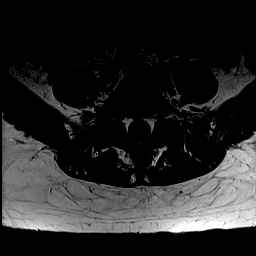
[im 11/33]
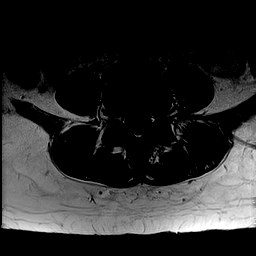
[im 15/33]
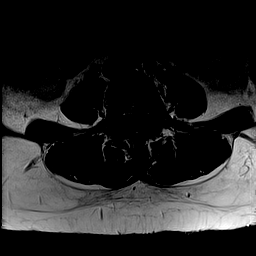
[im 18/33]
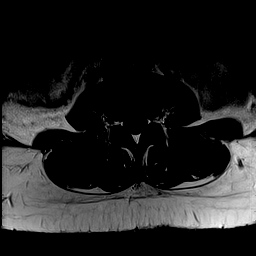
[im 22/33]
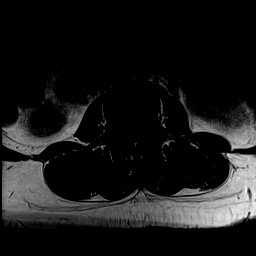
[im 25/33]
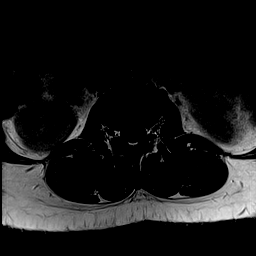
[im 29/33]
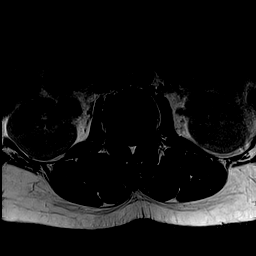
[im 33/33]
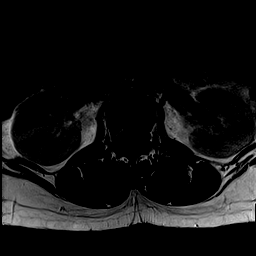

[Series 6: T2 · axial · 4.0mm · 0.78mm/px · z∈[-104,+73]mm · 10 of 33 slices shown (1 of 2)]
[im 1/33]
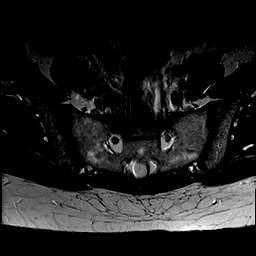
[im 4/33]
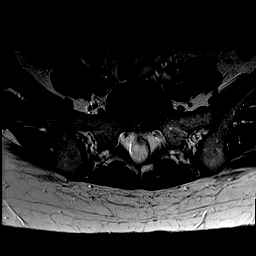
[im 8/33]
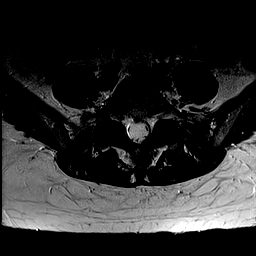
[im 11/33]
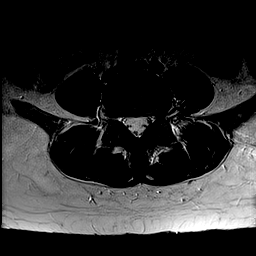
[im 15/33]
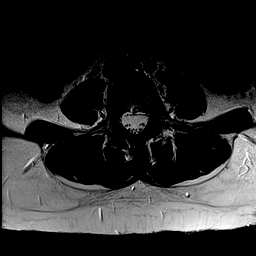
[im 18/33]
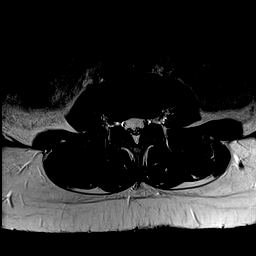
[im 22/33]
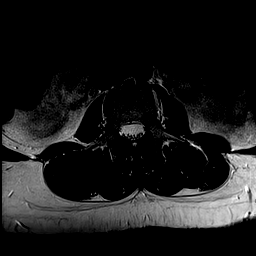
[im 25/33]
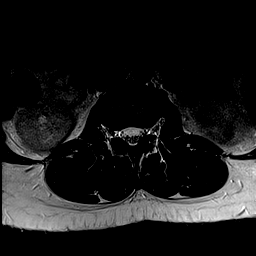
[im 29/33]
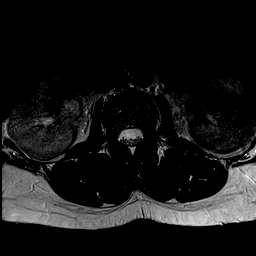
[im 33/33]
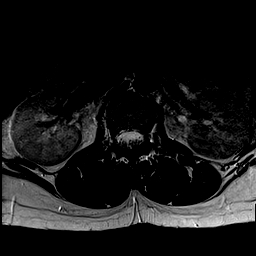

[Series 7: T2 · sagittal · 4.0mm · 0.94mm/px · 4 of 13 slices shown (2 of 2)]
[im 1/13]
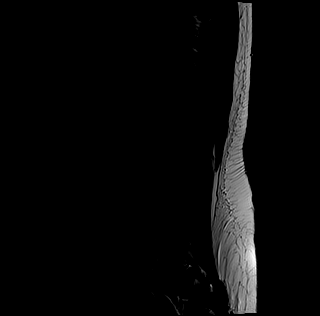
[im 5/13]
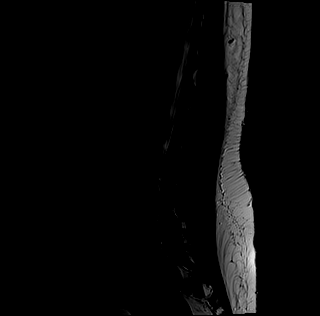
[im 9/13]
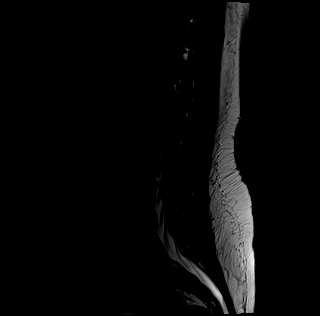
[im 13/13]
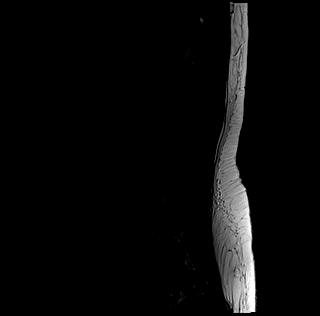

[Series 8: T1 fat-sat post-contrast · sagittal · 4.0mm · 0.88mm/px · 4 of 13 slices shown]
[im 1/13]
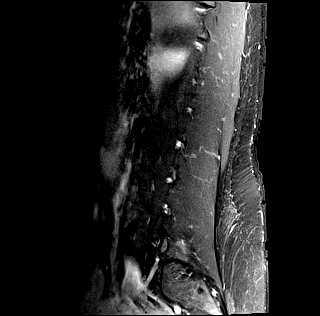
[im 5/13]
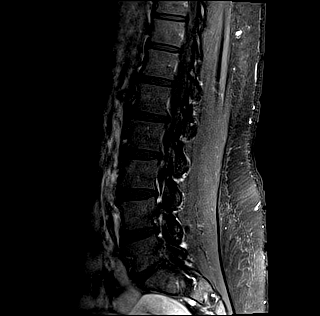
[im 9/13]
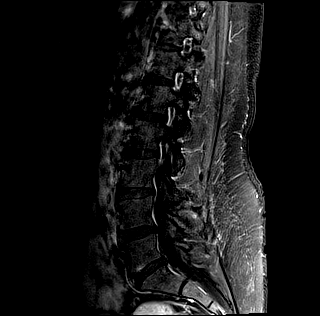
[im 13/13]
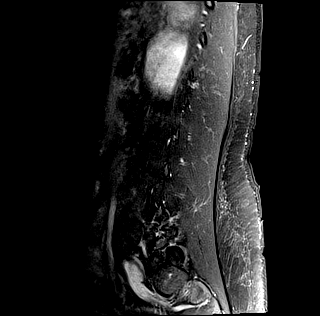

[Series 9: T1 post-contrast · axial · 4.0mm · 0.78mm/px · z∈[-104,+73]mm · 9 of 33 slices shown]
[im 1/33]
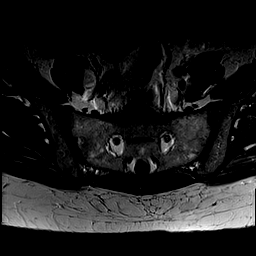
[im 4/33]
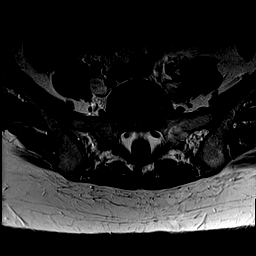
[im 8/33]
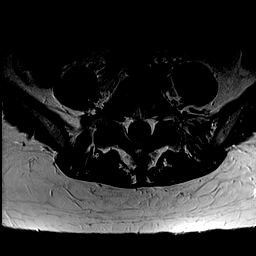
[im 11/33]
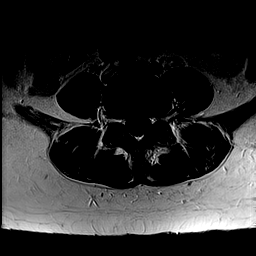
[im 15/33]
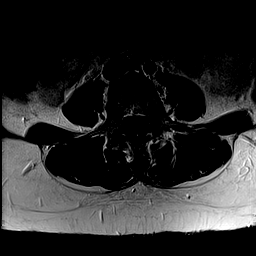
[im 18/33]
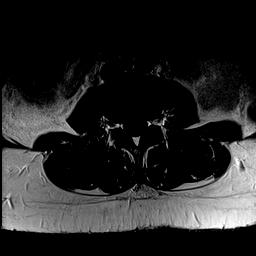
[im 22/33]
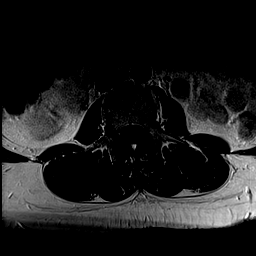
[im 29/33]
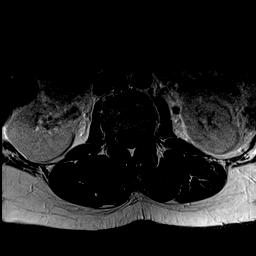
[im 33/33]
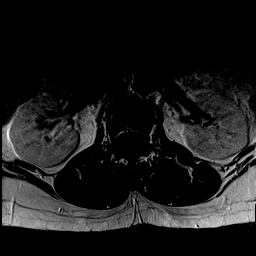

[47 of 48 positions shown; findings below may reference images not displayed]

FINDINGS: Segmentation:  Standard.

Alignment:  Normal.

Vertebrae:  No fracture or worrisome lesion.

Conus medullaris: Extends to the L1 level and appears normal.

Paraspinal and other soft tissues: Negative.

Disc levels:

T10-11, T11-12 and T12-L1 are imaged in the sagittal plane only and
negative.

L1-2:  Negative.

L2-3:  Mild facet degenerative change.  Otherwise negative.

L3-4: Mild facet degenerative change.  Otherwise negative.

L4-5: Very shallow disc bulge and mild facet degenerative disease.
The central spinal canal and neural foramina remain open.

L5-S1: Mild-to-moderate facet degenerative disease and a shallow
disc bulge. The central canal and foramina are open.
IMPRESSION: Mild degenerative disease L4-5 and L5-S1 without central canal or
foraminal narrowing.

## 2018-08-01 DIAGNOSIS — E039 Hypothyroidism, unspecified: Secondary | ICD-10-CM | POA: Diagnosis not present

## 2018-08-04 DIAGNOSIS — M7542 Impingement syndrome of left shoulder: Secondary | ICD-10-CM | POA: Diagnosis not present

## 2018-08-04 DIAGNOSIS — E663 Overweight: Secondary | ICD-10-CM | POA: Diagnosis not present

## 2018-08-04 DIAGNOSIS — E039 Hypothyroidism, unspecified: Secondary | ICD-10-CM | POA: Diagnosis not present

## 2018-08-13 DIAGNOSIS — H35022 Exudative retinopathy, left eye: Secondary | ICD-10-CM | POA: Diagnosis not present

## 2018-08-27 DIAGNOSIS — F3181 Bipolar II disorder: Secondary | ICD-10-CM | POA: Diagnosis not present

## 2018-08-29 DIAGNOSIS — H35022 Exudative retinopathy, left eye: Secondary | ICD-10-CM | POA: Diagnosis not present

## 2018-09-12 DIAGNOSIS — L409 Psoriasis, unspecified: Secondary | ICD-10-CM | POA: Diagnosis not present

## 2018-09-12 DIAGNOSIS — L4050 Arthropathic psoriasis, unspecified: Secondary | ICD-10-CM | POA: Diagnosis not present

## 2018-09-12 DIAGNOSIS — M25562 Pain in left knee: Secondary | ICD-10-CM | POA: Diagnosis not present

## 2018-09-12 DIAGNOSIS — F3176 Bipolar disorder, in full remission, most recent episode depressed: Secondary | ICD-10-CM | POA: Diagnosis not present

## 2018-09-15 DIAGNOSIS — L4050 Arthropathic psoriasis, unspecified: Secondary | ICD-10-CM | POA: Diagnosis not present

## 2018-09-15 DIAGNOSIS — J302 Other seasonal allergic rhinitis: Secondary | ICD-10-CM | POA: Diagnosis not present

## 2018-09-15 DIAGNOSIS — M7542 Impingement syndrome of left shoulder: Secondary | ICD-10-CM | POA: Diagnosis not present

## 2018-09-15 DIAGNOSIS — K219 Gastro-esophageal reflux disease without esophagitis: Secondary | ICD-10-CM | POA: Diagnosis not present

## 2018-09-26 DIAGNOSIS — H35022 Exudative retinopathy, left eye: Secondary | ICD-10-CM | POA: Diagnosis not present

## 2018-10-01 DIAGNOSIS — F3176 Bipolar disorder, in full remission, most recent episode depressed: Secondary | ICD-10-CM | POA: Diagnosis not present

## 2018-10-08 DIAGNOSIS — F3181 Bipolar II disorder: Secondary | ICD-10-CM | POA: Diagnosis not present

## 2018-10-13 DIAGNOSIS — H35022 Exudative retinopathy, left eye: Secondary | ICD-10-CM | POA: Diagnosis not present

## 2018-11-01 DIAGNOSIS — F3181 Bipolar II disorder: Secondary | ICD-10-CM | POA: Diagnosis not present

## 2018-11-11 DIAGNOSIS — H35022 Exudative retinopathy, left eye: Secondary | ICD-10-CM | POA: Diagnosis not present

## 2018-11-25 DIAGNOSIS — Z23 Encounter for immunization: Secondary | ICD-10-CM | POA: Diagnosis not present

## 2018-12-01 DIAGNOSIS — H35022 Exudative retinopathy, left eye: Secondary | ICD-10-CM | POA: Diagnosis not present

## 2018-12-06 DIAGNOSIS — F3181 Bipolar II disorder: Secondary | ICD-10-CM | POA: Diagnosis not present

## 2018-12-17 DIAGNOSIS — M25552 Pain in left hip: Secondary | ICD-10-CM | POA: Diagnosis not present

## 2018-12-17 DIAGNOSIS — K5900 Constipation, unspecified: Secondary | ICD-10-CM | POA: Diagnosis not present

## 2018-12-17 DIAGNOSIS — M549 Dorsalgia, unspecified: Secondary | ICD-10-CM | POA: Diagnosis not present

## 2018-12-22 DIAGNOSIS — H35022 Exudative retinopathy, left eye: Secondary | ICD-10-CM | POA: Diagnosis not present

## 2018-12-29 DIAGNOSIS — L821 Other seborrheic keratosis: Secondary | ICD-10-CM | POA: Diagnosis not present

## 2018-12-29 DIAGNOSIS — L814 Other melanin hyperpigmentation: Secondary | ICD-10-CM | POA: Diagnosis not present

## 2018-12-29 DIAGNOSIS — D225 Melanocytic nevi of trunk: Secondary | ICD-10-CM | POA: Diagnosis not present

## 2018-12-29 DIAGNOSIS — Z86018 Personal history of other benign neoplasm: Secondary | ICD-10-CM | POA: Diagnosis not present

## 2019-01-13 DIAGNOSIS — M5442 Lumbago with sciatica, left side: Secondary | ICD-10-CM | POA: Diagnosis not present

## 2019-01-13 DIAGNOSIS — M25552 Pain in left hip: Secondary | ICD-10-CM | POA: Diagnosis not present

## 2019-01-13 DIAGNOSIS — R32 Unspecified urinary incontinence: Secondary | ICD-10-CM | POA: Diagnosis not present

## 2019-01-19 DIAGNOSIS — H35022 Exudative retinopathy, left eye: Secondary | ICD-10-CM | POA: Diagnosis not present

## 2019-01-26 DIAGNOSIS — H35022 Exudative retinopathy, left eye: Secondary | ICD-10-CM | POA: Diagnosis not present

## 2019-01-26 DIAGNOSIS — H31002 Unspecified chorioretinal scars, left eye: Secondary | ICD-10-CM | POA: Diagnosis not present

## 2019-01-26 DIAGNOSIS — H35052 Retinal neovascularization, unspecified, left eye: Secondary | ICD-10-CM | POA: Diagnosis not present

## 2019-01-26 DIAGNOSIS — H3562 Retinal hemorrhage, left eye: Secondary | ICD-10-CM | POA: Diagnosis not present

## 2019-02-03 DIAGNOSIS — F411 Generalized anxiety disorder: Secondary | ICD-10-CM | POA: Diagnosis not present

## 2019-02-18 DIAGNOSIS — H35052 Retinal neovascularization, unspecified, left eye: Secondary | ICD-10-CM | POA: Diagnosis not present

## 2019-03-02 DIAGNOSIS — H35052 Retinal neovascularization, unspecified, left eye: Secondary | ICD-10-CM | POA: Diagnosis not present

## 2019-03-10 DIAGNOSIS — Z0000 Encounter for general adult medical examination without abnormal findings: Secondary | ICD-10-CM | POA: Diagnosis not present

## 2019-03-16 DIAGNOSIS — Z1159 Encounter for screening for other viral diseases: Secondary | ICD-10-CM | POA: Diagnosis not present

## 2019-03-16 DIAGNOSIS — K120 Recurrent oral aphthae: Secondary | ICD-10-CM | POA: Diagnosis not present

## 2019-03-16 DIAGNOSIS — M25562 Pain in left knee: Secondary | ICD-10-CM | POA: Diagnosis not present

## 2019-03-16 DIAGNOSIS — L4050 Arthropathic psoriasis, unspecified: Secondary | ICD-10-CM | POA: Diagnosis not present

## 2019-03-16 DIAGNOSIS — F3176 Bipolar disorder, in full remission, most recent episode depressed: Secondary | ICD-10-CM | POA: Diagnosis not present

## 2019-03-16 DIAGNOSIS — L409 Psoriasis, unspecified: Secondary | ICD-10-CM | POA: Diagnosis not present

## 2019-03-17 DIAGNOSIS — Z6830 Body mass index (BMI) 30.0-30.9, adult: Secondary | ICD-10-CM | POA: Diagnosis not present

## 2019-03-17 DIAGNOSIS — Z0000 Encounter for general adult medical examination without abnormal findings: Secondary | ICD-10-CM | POA: Diagnosis not present

## 2019-03-23 ENCOUNTER — Other Ambulatory Visit: Payer: Self-pay | Admitting: Family Medicine

## 2019-03-23 DIAGNOSIS — Z1231 Encounter for screening mammogram for malignant neoplasm of breast: Secondary | ICD-10-CM

## 2019-03-25 DIAGNOSIS — H35052 Retinal neovascularization, unspecified, left eye: Secondary | ICD-10-CM | POA: Diagnosis not present

## 2019-03-26 DIAGNOSIS — F411 Generalized anxiety disorder: Secondary | ICD-10-CM | POA: Diagnosis not present

## 2019-03-27 DIAGNOSIS — F3131 Bipolar disorder, current episode depressed, mild: Secondary | ICD-10-CM | POA: Diagnosis not present

## 2019-04-21 DIAGNOSIS — F3181 Bipolar II disorder: Secondary | ICD-10-CM | POA: Diagnosis not present

## 2019-04-29 DIAGNOSIS — H35052 Retinal neovascularization, unspecified, left eye: Secondary | ICD-10-CM | POA: Diagnosis not present

## 2019-05-04 DIAGNOSIS — F3176 Bipolar disorder, in full remission, most recent episode depressed: Secondary | ICD-10-CM | POA: Diagnosis not present

## 2019-05-19 DIAGNOSIS — F3181 Bipolar II disorder: Secondary | ICD-10-CM | POA: Diagnosis not present

## 2019-05-20 DIAGNOSIS — H35052 Retinal neovascularization, unspecified, left eye: Secondary | ICD-10-CM | POA: Diagnosis not present

## 2019-06-08 ENCOUNTER — Ambulatory Visit
Admission: RE | Admit: 2019-06-08 | Discharge: 2019-06-08 | Disposition: A | Discharge status: Home / Self Care | Payer: BLUE CROSS/BLUE SHIELD | Source: Ambulatory Visit | Attending: Family Medicine | Admitting: Family Medicine

## 2019-06-08 ENCOUNTER — Other Ambulatory Visit: Payer: Self-pay

## 2019-06-08 DIAGNOSIS — Z1231 Encounter for screening mammogram for malignant neoplasm of breast: Secondary | ICD-10-CM

## 2019-06-09 DIAGNOSIS — E039 Hypothyroidism, unspecified: Secondary | ICD-10-CM | POA: Diagnosis not present

## 2019-06-12 DIAGNOSIS — H35052 Retinal neovascularization, unspecified, left eye: Secondary | ICD-10-CM | POA: Diagnosis not present

## 2019-06-16 DIAGNOSIS — R102 Pelvic and perineal pain: Secondary | ICD-10-CM | POA: Diagnosis not present

## 2019-06-23 DIAGNOSIS — E039 Hypothyroidism, unspecified: Secondary | ICD-10-CM | POA: Diagnosis not present

## 2019-06-23 DIAGNOSIS — R5383 Other fatigue: Secondary | ICD-10-CM | POA: Diagnosis not present

## 2019-06-23 DIAGNOSIS — F3181 Bipolar II disorder: Secondary | ICD-10-CM | POA: Diagnosis not present

## 2019-06-23 DIAGNOSIS — K219 Gastro-esophageal reflux disease without esophagitis: Secondary | ICD-10-CM | POA: Diagnosis not present

## 2019-06-23 DIAGNOSIS — J302 Other seasonal allergic rhinitis: Secondary | ICD-10-CM | POA: Diagnosis not present

## 2019-06-30 DIAGNOSIS — H35052 Retinal neovascularization, unspecified, left eye: Secondary | ICD-10-CM | POA: Diagnosis not present

## 2019-07-09 DIAGNOSIS — S99921A Unspecified injury of right foot, initial encounter: Secondary | ICD-10-CM | POA: Diagnosis not present

## 2019-07-09 DIAGNOSIS — J309 Allergic rhinitis, unspecified: Secondary | ICD-10-CM | POA: Diagnosis not present

## 2019-07-09 DIAGNOSIS — R5383 Other fatigue: Secondary | ICD-10-CM | POA: Diagnosis not present

## 2019-07-09 DIAGNOSIS — F319 Bipolar disorder, unspecified: Secondary | ICD-10-CM | POA: Diagnosis not present

## 2019-07-11 DIAGNOSIS — F3111 Bipolar disorder, current episode manic without psychotic features, mild: Secondary | ICD-10-CM | POA: Diagnosis not present

## 2019-07-11 DIAGNOSIS — F3131 Bipolar disorder, current episode depressed, mild: Secondary | ICD-10-CM | POA: Diagnosis not present

## 2019-07-14 DIAGNOSIS — F3181 Bipolar II disorder: Secondary | ICD-10-CM | POA: Diagnosis not present

## 2019-07-28 DIAGNOSIS — H35052 Retinal neovascularization, unspecified, left eye: Secondary | ICD-10-CM | POA: Diagnosis not present

## 2019-07-31 ENCOUNTER — Other Ambulatory Visit: Payer: Self-pay | Admitting: Chiropractic Medicine

## 2019-07-31 ENCOUNTER — Other Ambulatory Visit: Payer: Self-pay

## 2019-07-31 ENCOUNTER — Ambulatory Visit
Admission: RE | Admit: 2019-07-31 | Discharge: 2019-07-31 | Disposition: A | Discharge status: Home / Self Care | Payer: BC Managed Care – PPO | Source: Ambulatory Visit | Attending: Chiropractic Medicine | Admitting: Chiropractic Medicine

## 2019-07-31 DIAGNOSIS — L4050 Arthropathic psoriasis, unspecified: Secondary | ICD-10-CM | POA: Diagnosis not present

## 2019-07-31 DIAGNOSIS — R52 Pain, unspecified: Secondary | ICD-10-CM

## 2019-07-31 DIAGNOSIS — M9903 Segmental and somatic dysfunction of lumbar region: Secondary | ICD-10-CM | POA: Diagnosis not present

## 2019-07-31 DIAGNOSIS — R201 Hypoesthesia of skin: Secondary | ICD-10-CM | POA: Diagnosis not present

## 2019-07-31 DIAGNOSIS — M25552 Pain in left hip: Secondary | ICD-10-CM | POA: Diagnosis not present

## 2019-07-31 DIAGNOSIS — M7612 Psoas tendinitis, left hip: Secondary | ICD-10-CM | POA: Diagnosis not present

## 2019-08-10 DIAGNOSIS — E559 Vitamin D deficiency, unspecified: Secondary | ICD-10-CM | POA: Diagnosis not present

## 2019-08-10 DIAGNOSIS — R5383 Other fatigue: Secondary | ICD-10-CM | POA: Diagnosis not present

## 2019-08-11 DIAGNOSIS — M9903 Segmental and somatic dysfunction of lumbar region: Secondary | ICD-10-CM | POA: Diagnosis not present

## 2019-08-11 DIAGNOSIS — M7612 Psoas tendinitis, left hip: Secondary | ICD-10-CM | POA: Diagnosis not present

## 2019-08-11 DIAGNOSIS — R201 Hypoesthesia of skin: Secondary | ICD-10-CM | POA: Diagnosis not present

## 2019-08-11 DIAGNOSIS — L4050 Arthropathic psoriasis, unspecified: Secondary | ICD-10-CM | POA: Diagnosis not present

## 2019-08-12 DIAGNOSIS — M25552 Pain in left hip: Secondary | ICD-10-CM | POA: Diagnosis not present

## 2019-08-12 DIAGNOSIS — R5383 Other fatigue: Secondary | ICD-10-CM | POA: Diagnosis not present

## 2019-08-12 DIAGNOSIS — K219 Gastro-esophageal reflux disease without esophagitis: Secondary | ICD-10-CM | POA: Diagnosis not present

## 2019-08-12 DIAGNOSIS — K581 Irritable bowel syndrome with constipation: Secondary | ICD-10-CM | POA: Diagnosis not present

## 2019-08-18 DIAGNOSIS — F3181 Bipolar II disorder: Secondary | ICD-10-CM | POA: Diagnosis not present

## 2019-08-19 ENCOUNTER — Ambulatory Visit: Payer: BC Managed Care – PPO | Attending: Family Medicine | Admitting: Physical Therapy

## 2019-08-19 ENCOUNTER — Other Ambulatory Visit: Payer: Self-pay

## 2019-08-19 ENCOUNTER — Encounter: Payer: Self-pay | Admitting: Physical Therapy

## 2019-08-19 DIAGNOSIS — M545 Low back pain, unspecified: Secondary | ICD-10-CM

## 2019-08-19 DIAGNOSIS — M6281 Muscle weakness (generalized): Secondary | ICD-10-CM

## 2019-08-19 DIAGNOSIS — M25552 Pain in left hip: Secondary | ICD-10-CM

## 2019-08-19 NOTE — Therapy (Signed)
[facility] [address], [address] [place], [place], [address] Phone: [phone]   Fax:  [phone]  Physical Therapy Evaluation  Patient Details  Name: Stephanie [name] MRN: [id number] Date of Birth: [birth year] Referring Provider (PT): Lewis Moccasin, MD   Encounter Date: 08/19/2019  PT End of Session - 08/19/19 1533    Visit Number  1    Date for PT Re-Evaluation  10/14/19    Authorization Type  BCBS    Authorization - Visit Number  1    Authorization - Number of Visits  30    PT Start Time  0935    PT Stop Time  1017    PT Time Calculation (min)  42 min    Activity Tolerance  Patient tolerated treatment well    Behavior During Therapy  [facility] for tasks assessed/performed       Past Medical History:  Diagnosis Date  . Acid reflux   . Anxiety   . Arthritis   . Asthma   . Bipolar depression (HCC)   . Carpal tunnel syndrome   . Depression   . Hypothyroid   . IBS (irritable bowel syndrome)   . Normal pregnancy, first 07/27/2011  . Sinusitis   . SVD (spontaneous vaginal delivery) 07/28/2011  . SVD (spontaneous vaginal delivery) 07/28/2011    Past Surgical History:  Procedure Laterality Date  . extraction of wisdom teeth      There were no vitals filed for this visit.   Subjective Assessment - 08/19/19 0933    Subjective  Pt with onset of Lt hip "popping out of place" after having daughter in 2013.  Pain was accompanied by incontinence of both urine and feces.  Improved with PT in past.  Same symptoms have returned when she slipped in a creek in Sept 2020.  Pain is in Lt hip circumferentially and in low back.  Hurts to stand, squat, bend. Better with walking.  Urine leakage occurs in moderate amounts with cough/sneeze and fecal incontinence occurs with sensation of need to pass gas.    Pertinent History  PT in past has helped same symptoms with pelvic alignment and exercise, history of constipation (meds have  given her loose stool), IBS, psoriatic arthritis    Limitations  Standing    How long can you stand comfortably?  5 min    How long can you walk comfortably?  symptoms improve with walking    Diagnostic tests  xrays at chiro 2021 - negative, pelvic US 2019 negative, lumbar MRI 2018 Mild degenerative disease L4-5 and L5-S1 without central canal foraminal narrowing    Currently in Pain?  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, getting up/down from floor Dispensing optician)         [facility] PT Assessment - 08/19/19 0001      Assessment   Medical Diagnosis  M25.552 (ICD-10-CM) - Pain in  left hip    Referring Provider (PT)  Lewis Moccasinewey, Elizabeth R, MD    Next MD Visit  --   6-8 weeks   Prior Therapy  yes, with Eulis Fosterheryl Gray years ago      Precautions   Precautions  None      Restrictions   Weight Bearing Restrictions  No      Balance Screen   Has the patient fallen in the past 6 months  No      Home Environment   Living Environment  Private residence    Home Access  Stairs to enter    Entrance Stairs-Number of Steps  1    Home Layout  One level      Prior Function   Level of Independence  Independent    Vocation  Full time employment    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, get up/down from floor    Leisure  walking, playing with daughter, Zumba/Just Dance      Cognition   Overall Cognitive Status  Within Functional Limits for tasks assessed      Functional Tests   Functional tests  Squat;Single leg stance      Squat   Comments  Lt  pelvic rotation with deep squat      Single Leg Stance   Comments  Poor Lt balance, fair on Rt, pain in Lt SLS      ROM / Strength   AROM / PROM / Strength  AROM;Strength      AROM   AROM Assessment Site  Lumbar;Hip    Right/Left Hip  Left    Left Hip Flexion  90   pain   Left Hip External Rotation   50   pain   Left Hip Internal Rotation   15   pain   Lumbar Flexion  55 degrees, pain    Lumbar Extension  15 degrees, relief    Lumbar - Right Side Bend  15 degrees    Lumbar - Left Side Bend  10 degrees, Rt LBP      Strength   Strength Assessment Site  Hip    Right/Left Hip  Left    Left Hip Flexion  4-/5   pain   Left Hip Extension  4/5    Left Hip External Rotation  4-/5    Left Hip Internal Rotation  4/5    Left Hip ABduction  4-/5    Left Hip ADduction  4-/5      Flexibility   Soft Tissue Assessment /Muscle Length  yes   acute Lt hip pain limited full flexibility assessment     Palpation   SI assessment   Lt SI joint upslip with anterior rotation, compressed and lack of glide, painful on palpation and passive glide assessment    Palpation comment  Lt proximal adductors (signif tone and tenderness), iliacus (Lt), bil psoas, Lt glut med/min/piriformis, bil thoracic paraspinals and lumbar mulitifdi      Special Tests    Special Tests  Lumbar;Hip Special Tests    Lumbar Tests  other    Hip Special Tests   Luisa HartPatrick (FABER) Test;Hip Scouring;Anterior Hip Impingement Test      other   Findings  Positive    Side   Left    Comments  Lt LE distraction, for relief, in supine      Luisa HartPatrick (FABER) Test   Findings  Positive    Side  Left    Comments  pain limited end range of  test      Hip Scouring   Findings  Positive    Side  Left      Anterior Hip Impingement Test    Findings  Positive    Side   Left    Comments  FADIR, pain limited end range of test                  Objective measurements completed on examination: See above findings.      [place]  Adult PT Treatment/Exercise - 08/19/19 0001      Manual Therapy   Manual Therapy  Joint mobilization    Joint Mobilization  Lt long axis traction, supine, Gr V             PT Education - 08/19/19 1533    Education Details  Access Code: [email or web address]    Person(s) Educated  Patient    Methods  Explanation;Handout;Verbal cues;Demonstration    Comprehension  Verbalized understanding;Returned demonstration       PT Short Term Goals - 08/19/19 1548      PT SHORT TERM GOAL #1   Title  Pt will be ind with initial HEP for gentle hip and trunk mobility, stretching, and lumbar/pelvic/hip stability.    Time  4    Period  Weeks    Status  New    Target Date  09/16/19      PT SHORT TERM GOAL #2   Title  Pt will have internal pelvic floor assessment by pelvic PT to determine strength and function of pelvic floor muscles and incorporate appropriate exercise into HEP.    Time  4    Period  Weeks    Status  New    Target Date  09/16/19      PT SHORT TERM GOAL #3   Title  Pt will achieve P/ROM of Lt hip to at least 110 flexion, 55 ER, 20 IR without pain.    Time  4    Period  Weeks    Status  New    Target Date  09/16/19      PT SHORT TERM GOAL #4   Title  Pt will demo improved closed chain strength of Lt hip with gait and stairs by demo'ing control over + Trendelenburg.    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    Time  8    Period  Weeks    Status  New    Target Date  10/14/19      PT LONG TERM GOAL #2   Title  Pt will demo full trunk and Lt hip mobility for functional tasks such as squatting, bending and rising from floor with pain not to exceed 2/10.    Time  8    Period  Weeks    Status  New    Target Date  10/14/19      PT LONG TERM  GOAL #3   Title  Pt will achieve Lt hip and core strength of at least 4+/5 for improved work task performance.    Time  8    Period  Weeks  Status  New    Target Date  10/14/19      PT LONG TERM GOAL #4   Title  Pt will be able to tolerate static standing tasks for at least 30 min with pain not to exceed 2/10.      PT LONG TERM GOAL #5   Title  Pt will report resolution of urine and fecal leakage and understand how to recruit and train strength of her PF as part of her HEP.    Time  8    Period  Weeks    Status  New    Target Date  10/14/19             Plan - 08/19/19 1559    Clinical Impression Statement  Pt is a 39yo female with exacerbation of the same symptoms from years ago after having a vaginal delivery of her daughter in 2013.  She slipped while walking in a creek and jarred her Lt hip "which goes out of place."  Pain is circumferential around Lt hip and is acute.  Pain is worse with bending, squatting, stairs, and static standing and better with walking.  Pain now spreads into bil low and mid back.  Pain has been accompanied by urine leakage with cough/sneeze which is moderate in amount (soaks clothing).  Pt reports 2 episodes of fecal leakage when she thought she needed to pass gas (this may be due to medication change to address constipation and Pt states she may have taken too much causing loose stool.)  PT in past has helped which included pelvic alignment exercises, ROM and strength.  Pt has seen chiropractor since onset of current pain.  Hip x-ray was negative.  MRI from 2018 revealed mild degen disease at L4-S1 without stenosis.  Pt presents with limited ROM of trunk and Lt hip (acute pain with P/ROM), Lt SI joint upslip, limited and painful weakness of Lt hip, weak core, and signif muscle guarding of Lt proximal adductors, gluteals, lumbar and thoracic paraspinals.  She has + Trendelenburg bil Lt>Rt with SLS (poor balance on Lt), step ups and gait.  PT performed manual  technique for Lt SI joint alignment today with improved joint glide and position, reduced tenderness surrounding hip, and improved tolerance of gentle P/ROM of Lt hip end of session.  Started Pt on initial HEP for core contractions and hip stability in closed chain.  Pt will benefit from additional assessment of pelvic floor at subsequent visit due to lack of time on initial evaluation today.    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    Examination-Activity Limitations  Continence;Stand;Stairs;Lift;Bend    Examination-Participation Restrictions  Community Activity;Yard Work;Meal Prep;Cleaning    Stability/Clinical Decision Making  Evolving/Moderate complexity    Clinical Decision Making  Moderate    Rehab Potential  Good    PT Frequency  2x / week    PT Duration  8 weeks    PT Treatment/Interventions  ADLs/Self Care Home Management;Aquatic Therapy;Cryotherapy;Electrical Stimulation;Moist Heat;Iontophoresis 4mg /ml Dexamethasone;Traction;Gait training;Stair training;Functional mobility training;Therapeutic exercise;Therapeutic activities;Balance training;Neuromuscular re-education;Patient/family education;Manual techniques;Joint Manipulations;Spinal Manipulations;Taping;Dry needling;Passive range of motion    PT Next Visit Plan  intro DN (if consents, Lt proximal adductors, lumbar multifidi bil), check pelvic alignment, STM lumbar and thoracic bil, gentle P/ROM Lt hip, hip  and core strength as tol    PT Home Exercise Plan  Access Code: [email or web address]    Recommended Other Services  pelvic PT to assess pelvic floor at a follow up visit    Consulted and Agree with Plan of Care  Patient       Patient will  benefit from skilled therapeutic intervention in order to improve the following deficits and impairments:  Decreased range of motion, Increased muscle spasms, Pain, Decreased activity tolerance, Impaired flexibility, Decreased balance, Decreased strength, Hypomobility, Decreased mobility, Improper body mechanics  Visit Diagnosis: Pain in left hip - Plan: PT plan of care cert/re-cert  Acute bilateral low back pain without sciatica - Plan: PT plan of care cert/re-cert  Muscle weakness (generalized) - Plan: PT plan of care cert/re-cert     Problem List Patient Active Problem List   Diagnosis Date Noted  . SVD (spontaneous vaginal delivery) 07/28/2011   Morton Peters, PT 08/19/19 4:16 PM   [facility] [address], [address] [place], [place], [address] Phone: [phone]   Fax:  [phone]  Name: DESHUNDA THACKSTON MRN: [id number] Date of Birth: 1980-05-24

## 2019-08-19 NOTE — Patient Instructions (Signed)
Access Code: [email or web address] URL: [email or web address]: 06/02/2021Prepared by: Loistine Simas BeuhringExercises  Sidelying Transversus Abdominis Bracing - 1 x daily - 7 x weekly - 1 sets - 10 reps - 10 hold  Single Leg Stance with Support - 1 x daily - 7 x weekly - 1 sets - 3 reps - 10 hold  Stride Stance Weight Shift - 1 x daily - 7 x weekly - 1 sets - 15 reps - 3 hold  Forward Step Up - 1 x daily - 7 x weekly - 10 reps - 3 sets  Side Lunge Adductor Stretch - 1 x daily - 7 x weekly - 1 sets - 3 reps - 30 hold

## 2019-08-25 DIAGNOSIS — M9903 Segmental and somatic dysfunction of lumbar region: Secondary | ICD-10-CM | POA: Diagnosis not present

## 2019-08-25 DIAGNOSIS — H35052 Retinal neovascularization, unspecified, left eye: Secondary | ICD-10-CM | POA: Diagnosis not present

## 2019-08-25 DIAGNOSIS — M7612 Psoas tendinitis, left hip: Secondary | ICD-10-CM | POA: Diagnosis not present

## 2019-08-25 DIAGNOSIS — R201 Hypoesthesia of skin: Secondary | ICD-10-CM | POA: Diagnosis not present

## 2019-08-25 DIAGNOSIS — L4050 Arthropathic psoriasis, unspecified: Secondary | ICD-10-CM | POA: Diagnosis not present

## 2019-08-27 ENCOUNTER — Other Ambulatory Visit: Payer: Self-pay

## 2019-08-27 ENCOUNTER — Ambulatory Visit: Payer: BC Managed Care – PPO | Admitting: Physical Therapy

## 2019-08-27 ENCOUNTER — Encounter: Payer: Self-pay | Admitting: Physical Therapy

## 2019-08-27 DIAGNOSIS — M545 Low back pain, unspecified: Secondary | ICD-10-CM

## 2019-08-27 DIAGNOSIS — M6281 Muscle weakness (generalized): Secondary | ICD-10-CM

## 2019-08-27 DIAGNOSIS — M25552 Pain in left hip: Secondary | ICD-10-CM | POA: Diagnosis not present

## 2019-08-27 NOTE — Patient Instructions (Signed)
Access Code: [email or web address] URL: [email or web address] Date: 08/27/2019 Prepared by: Lavinia Sharps  Exercises Sidelying Transversus Abdominis Bracing - 1 x daily - 7 x weekly - 1 sets - 10 reps - 10 hold Single Leg Stance with Support - 1 x daily - 7 x weekly - 1 sets - 3 reps - 10 hold Stride Stance Weight Shift - 1 x daily - 7 x weekly - 1 sets - 15 reps - 3 hold Forward Step Up - 1 x daily - 7 x weekly - 10 reps - 3 sets Side Lunge Adductor Stretch - 1 x daily - 7 x weekly - 1 sets - 3 reps - 30 hold Supine Figure 4 Piriformis Stretch - 1 x daily - 7 x weekly - 1 sets - 3 reps - 20 hold Seated Piriformis Stretch - 1 x daily - 7 x weekly - 1 sets - 3 reps - 20 hold Supine Hip Adductor Stretch - 1 x daily - 7 x weekly - 1 sets - 3 reps - 20 hold   Trigger Point Dry Needling   What is Trigger Point Dry Needling (DN)? o DN is a physical therapy technique used to treat muscle pain and dysfunction. Specifically, DN helps deactivate muscle trigger points (muscle knots).  o A thin filiform needle is used to penetrate the skin and stimulate the underlying trigger point. The goal is for a local twitch response (LTR) to occur and for the trigger point to relax. No medication of any kind is injected during the procedure.    What Does Trigger Point Dry Needling Feel Like?  o The procedure feels different for each individual patient. Some patients report that they do not actually feel the needle enter the skin and overall the process is not painful. Very mild bleeding may occur. However, many patients feel a deep cramping in the muscle in which the needle was inserted. This is the local twitch response.    How Will I feel after the treatment? o Soreness is normal, and the onset of soreness may not occur for a few hours. Typically this soreness does not last longer than two days.  o Bruising is uncommon, however; ice can be used to decrease any possible bruising.  o In rare cases feeling  tired or nauseous after the treatment is normal. In addition, your symptoms may get worse before they get better, this period will typically not last longer than 24 hours.    What Can I do After My Treatment? o Increase your hydration by drinking more water for the next 24 hours. o You may place ice or heat on the areas treated that have become sore, however, do not use heat on inflamed or bruised areas. Heat often brings more relief post needling. o You can continue your regular activities, but vigorous activity is not recommended initially after the treatment for 24 hours. o DN is best combined with other physical therapy such as strengthening, stretching, and other therapies.

## 2019-08-27 NOTE — Therapy (Signed)
[facility] [address] [place], [place], [address] Phone: [phone]   Fax:  [phone]  Physical Therapy Treatment  Patient Details  Name: Stephanie [name] MRN: [id number] Date of Birth: 1980-10-26 Referring Provider (PT): Lewis Moccasin, MD   Encounter Date: 08/27/2019   PT End of Session - 08/27/19 1900    Visit Number 2    Date for PT Re-Evaluation 10/14/19    Authorization Type BCBS    Authorization - Visit Number 2    Authorization - Number of Visits 30    PT Start Time 0800    PT Stop Time 0844    PT Time Calculation (min) 44 min    Activity Tolerance Patient tolerated treatment well           Past Medical History:  Diagnosis Date  . Acid reflux   . Anxiety   . Arthritis   . Asthma   . Bipolar depression (HCC)   . Carpal tunnel syndrome   . Depression   . Hypothyroid   . IBS (irritable bowel syndrome)   . Normal pregnancy, first 07/27/2011  . Sinusitis   . SVD (spontaneous vaginal delivery) 07/28/2011  . SVD (spontaneous vaginal delivery) 07/28/2011    Past Surgical History:  Procedure Laterality Date  . extraction of wisdom teeth      There were no vitals filed for this visit.   Subjective Assessment - 08/27/19 0806    Subjective I felt better after last time but just like the chiro it was short lived.  The rain makes my arthritis hurt.    Pertinent History PT in past has helped same symptoms with pelvic alignment and exercise, history of constipation (meds have given her loose stool), IBS, psoriatic arthritis    Diagnostic tests xrays at chiro 2021 - negative, pelvic US 2019 negative, lumbar MRI 2018 Mild degenerative disease L4-5 and L5-S1 without central canal foraminal narrowing    Pain Score 7     Pain Location Hip    Pain Orientation Left                             [facility] Adult PT Treatment/Exercise - 08/27/19 0001      Self-Care   Self-Care Other  Self-Care Comments    Other Self-Care Comments  DN after care      Knee/Hip Exercises: Stretches   Piriformis Stretch Limitations supine and seated     Other Knee/Hip Stretches review of standing hip adductor stretch from last visit     Other Knee/Hip Stretches supine bent knee fall out to stretch adductors       Moist Heat Therapy   Number Minutes Moist Heat 3 Minutes    Moist Heat Location Hip      Manual Therapy   Soft tissue mobilization left hip adductors, gluteals, piriformis            Trigger Point Dry Needling - 08/27/19 0001    Consent Given? Yes    Education Handout Provided Yes    Muscles Treated Lower Quadrant Adductor longus/brevis/magnus    Muscles Treated Back/Hip Gluteus minimus;Gluteus medius;Gluteus maximus;Piriformis    Other Dry Needling left only    Adductor Response Twitch response elicited;Palpable increased muscle length    Gluteus Minimus Response Twitch response elicited;Palpable increased muscle length    Gluteus Medius Response Twitch response elicited;Palpable increased muscle length    Gluteus Maximus Response Twitch response  elicited;Palpable increased muscle length    Piriformis Response Twitch response elicited;Palpable increased muscle length                PT Education - 08/27/19 1859    Education Details supine and seated piriformis stretch;  hip adductor supine stretch;  DN after care    Person(s) Educated Patient    Methods Explanation;Demonstration;Handout    Comprehension Returned demonstration;Verbalized understanding            PT Short Term Goals - 08/19/19 1548      PT SHORT TERM GOAL #1   Title Pt will be ind with initial HEP for gentle hip and trunk mobility, stretching, and lumbar/pelvic/hip stability.    Time 4    Period Weeks    Status New    Target Date 09/16/19      PT SHORT TERM GOAL #2   Title Pt will have internal pelvic floor assessment by pelvic PT to determine strength and function of pelvic floor  muscles and incorporate appropriate exercise into HEP.    Time 4    Period Weeks    Status New    Target Date 09/16/19      PT SHORT TERM GOAL #3   Title Pt will achieve P/ROM of Lt hip to at least 110 flexion, 55 ER, 20 IR without pain.    Time 4    Period Weeks    Status New    Target Date 09/16/19      PT SHORT TERM GOAL #4   Title Pt will demo improved closed chain strength of Lt hip with gait and stairs by demo'ing control over + Trendelenburg.    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    Time 8    Period Weeks    Status New    Target Date 10/14/19      PT LONG TERM GOAL #2   Title Pt will demo full trunk and Lt hip mobility for functional tasks such as squatting, bending and rising from floor with pain not to exceed 2/10.    Time 8    Period Weeks    Status New    Target Date 10/14/19      PT LONG TERM GOAL #3   Title Pt will achieve Lt hip and core strength of at least 4+/5 for improved work task performance.    Time 8    Period Weeks    Status New    Target Date 10/14/19      PT LONG TERM GOAL #4   Title Pt will be able to tolerate static standing tasks for at least 30 min with pain not to exceed 2/10.      PT LONG TERM GOAL #5   Title Pt will report resolution of urine and fecal leakage and understand how to recruit and train strength of her PF as part of her HEP.    Time 8    Period Weeks    Status New    Target Date 10/14/19  Plan - 08/27/19 0830    Clinical Impression Statement The patient has multiple tender points in gluteals, piriformis and hip adductors.  The patient is receptive to trying DN and manual therapy with decreased tender point size and number following and improved soft tissue  length.  Encouraged frequent stretching of shortened tissues for best long term outcomes.  Therapist closely monitoring response with all interventions.    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    Rehab Potential Good    PT Frequency 2x / week    PT Duration 8 weeks    PT Treatment/Interventions ADLs/Self Care Home Management;Aquatic Therapy;Cryotherapy;Electrical Stimulation;Moist Heat;Iontophoresis 4mg /ml Dexamethasone;Traction;Gait training;Stair training;Functional mobility training;Therapeutic exercise;Therapeutic activities;Balance training;Neuromuscular re-education;Patient/family education;Manual techniques;Joint Manipulations;Spinal Manipulations;Taping;Dry needling;Passive range of motion    PT Next Visit Plan assess response to DN #1 of adductors, gluteals and piriformis, check pelvic alignment, STM lumbar and thoracic bil, gentle P/ROM Lt hip, hip and core strength as tol ;  Going to the beach next week    PT Home Exercise Plan Access Code: [email or web address]           Patient will benefit from skilled therapeutic intervention in order to improve the following deficits and impairments:  Decreased range of motion, Increased muscle spasms, Pain, Decreased activity tolerance, Impaired flexibility, Decreased balance, Decreased strength, Hypomobility, Decreased mobility, Improper body mechanics  Visit Diagnosis: Pain in left hip  Acute bilateral low back pain without sciatica  Muscle weakness (generalized)     Problem List Patient Active Problem List   Diagnosis Date Noted  . SVD (spontaneous vaginal delivery) 07/28/2011   09/27/2011, PT 08/27/19 7:07 PM Phone: [phone] Fax: [phone] 08/27/2019, 7:06 PM  [facility] [address], [address] [place], [place], [place] Phone: [phone]   Fax:  [phone]  Name: DHANI IMEL MRN: Cherlyn Roberts Date of Birth: 1981-02-13

## 2019-09-07 DIAGNOSIS — R201 Hypoesthesia of skin: Secondary | ICD-10-CM | POA: Diagnosis not present

## 2019-09-07 DIAGNOSIS — M7612 Psoas tendinitis, left hip: Secondary | ICD-10-CM | POA: Diagnosis not present

## 2019-09-07 DIAGNOSIS — L4050 Arthropathic psoriasis, unspecified: Secondary | ICD-10-CM | POA: Diagnosis not present

## 2019-09-07 DIAGNOSIS — M9903 Segmental and somatic dysfunction of lumbar region: Secondary | ICD-10-CM | POA: Diagnosis not present

## 2019-09-09 DIAGNOSIS — H35052 Retinal neovascularization, unspecified, left eye: Secondary | ICD-10-CM | POA: Diagnosis not present

## 2019-09-16 DIAGNOSIS — M25562 Pain in left knee: Secondary | ICD-10-CM | POA: Diagnosis not present

## 2019-09-16 DIAGNOSIS — L409 Psoriasis, unspecified: Secondary | ICD-10-CM | POA: Diagnosis not present

## 2019-09-16 DIAGNOSIS — F3176 Bipolar disorder, in full remission, most recent episode depressed: Secondary | ICD-10-CM | POA: Diagnosis not present

## 2019-09-16 DIAGNOSIS — L4050 Arthropathic psoriasis, unspecified: Secondary | ICD-10-CM | POA: Diagnosis not present

## 2019-09-22 DIAGNOSIS — H35052 Retinal neovascularization, unspecified, left eye: Secondary | ICD-10-CM | POA: Diagnosis not present

## 2019-09-24 ENCOUNTER — Other Ambulatory Visit: Payer: Self-pay

## 2019-09-24 ENCOUNTER — Ambulatory Visit: Payer: BC Managed Care – PPO | Attending: Family Medicine | Admitting: Physical Therapy

## 2019-09-24 ENCOUNTER — Encounter: Payer: Self-pay | Admitting: Physical Therapy

## 2019-09-24 DIAGNOSIS — M545 Low back pain, unspecified: Secondary | ICD-10-CM

## 2019-09-24 DIAGNOSIS — M6281 Muscle weakness (generalized): Secondary | ICD-10-CM

## 2019-09-24 DIAGNOSIS — M25552 Pain in left hip: Secondary | ICD-10-CM | POA: Insufficient documentation

## 2019-09-24 NOTE — Therapy (Signed)
[facility] [address], [address] [place], [place], [address] Phone: [phone]   Fax:  [phone]  Physical Therapy Treatment  Patient Details  Name: Stephanie [name] MRN: [id number] Date of Birth: 09-04-1980 Referring Provider (PT): Lewis Moccasin, MD   Encounter Date: 09/24/2019   PT End of Session - 09/24/19 0837    Visit Number 3    Date for PT Re-Evaluation 10/14/19    Authorization Type BCBS    Authorization - Visit Number 3    Authorization - Number of Visits 30    PT Start Time 0801    PT Stop Time 0843    PT Time Calculation (min) 42 min    Activity Tolerance Patient tolerated treatment well           Past Medical History:  Diagnosis Date  . Acid reflux   . Anxiety   . Arthritis   . Asthma   . Bipolar depression (HCC)   . Carpal tunnel syndrome   . Depression   . Hypothyroid   . IBS (irritable bowel syndrome)   . Normal pregnancy, first 07/27/2011  . Sinusitis   . SVD (spontaneous vaginal delivery) 07/28/2011  . SVD (spontaneous vaginal delivery) 07/28/2011    Past Surgical History:  Procedure Laterality Date  . extraction of wisdom teeth      There were no vitals filed for this visit.   Subjective Assessment - 09/24/19 0804    Subjective The needling helped a lot.  I could tell the next day.  I used the pool a lot at [employer].  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  Getting up off the floor is still difficult.  Today it's the anterior hip/a little lateral and less groin region.    Pertinent History PT in past has helped same symptoms with pelvic alignment and exercise, history of constipation (meds have given her loose stool), IBS, psoriatic arthritis    Currently in Pain? Yes    Pain Score 5     Pain Location Hip    Pain Orientation Left    Aggravating Factors  rain, squatting, rising                             [facility] Adult PT Treatment/Exercise - 09/24/19 0001       Therapeutic Activites    Therapeutic Activities Other Therapeutic Activities    Other Therapeutic Activities how to get on/off the floor      Lumbar Exercises: Aerobic   Nustep L1 4 min while discussing status       Lumbar Exercises: Quadruped   Single Arm Raise Right;Left;5 reps      Knee/Hip Exercises: Stretches   Other Knee/Hip Stretches 2nd step hip flexor stretch 11x       Knee/Hip Exercises: Standing   Other Standing Knee Exercises Captain Morgans: hip abduction ball on wall isometric 5 sec hold 7x right/left at a "7/20" effort level     Other Standing Knee Exercises golf club hip hinge and partial squat 15x      Knee/Hip Exercises: Seated   Sit to Sand 2 sets;5 reps;without UE support   2nd set holding 5# weight      Moist Heat Therapy   Number Minutes Moist Heat 3 Minutes    Moist Heat Location Hip      Manual Therapy   Soft tissue mobilization left anterior and lateral  Trigger Point Dry Needling - 09/24/19 0001    Consent Given? Yes    Muscles Treated Lower Quadrant Quadriceps;Rectus femoris;Vastus lateralis    Other Dry Needling left only    Quadriceps Response Palpable increased muscle length    Rectus femoris Response Palpable increased muscle length                PT Education - 09/24/19 0837    Education Details Darrall Dears, bird dogs; hip hinge with dowel    Person(s) Educated Patient    Methods Explanation;Demonstration;Handout    Comprehension Returned demonstration;Verbalized understanding            PT Short Term Goals - 09/24/19 1200      PT SHORT TERM GOAL #1   Title Pt will be ind with initial HEP for gentle hip and trunk mobility, stretching, and lumbar/pelvic/hip stability.    Status Achieved      PT SHORT TERM GOAL #2   Title Pt will have internal pelvic floor assessment by pelvic PT to determine strength and function of pelvic floor muscles and incorporate appropriate exercise into HEP.    Time 4    Period  Weeks    Status On-going      PT SHORT TERM GOAL #3   Title Pt will achieve P/ROM of Lt hip to at least 110 flexion, 55 ER, 20 IR without pain.    Time 4    Period Weeks    Status On-going      PT SHORT TERM GOAL #4   Title Pt will demo improved closed chain strength of Lt hip with gait and stairs by demo'ing control over + Trendelenburg.    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    Time 8    Period Weeks    Status New    Target Date 10/14/19      PT LONG TERM GOAL #2   Title Pt will demo full trunk and Lt hip mobility for functional tasks such as squatting, bending and rising from floor with pain not to exceed 2/10.    Time 8    Period Weeks    Status New    Target Date 10/14/19      PT LONG TERM GOAL #3   Title Pt will achieve Lt hip and core strength of at least 4+/5 for improved work task performance.    Time 8    Period Weeks    Status New    Target Date 10/14/19      PT LONG TERM GOAL #4   Title Pt will be able to tolerate static standing tasks for at least 30 min with pain not to exceed 2/10.      PT LONG TERM GOAL #5   Title Pt will report resolution of urine and fecal leakage and understand how to recruit and train strength of her PF as part of her HEP.    Time 8    Period Weeks    Status New    Target Date 10/14/19                 Plan - 09/24/19 [phone]  Clinical Impression Statement The patient had excellent relief from DN with considerably fewer tender points.  She is no longer having symptoms in medial thigh/groin or buttock region.  She does have tender points in rectus femoris and vastus lateralis muscles which respond well to manual therapy and DN.  Significant weakness in left gluteals compared to right with pelvic drop and  earlier muscle fatigue noted with weight bearing ex's.  Instructed in hip hinge method for improved function.  Only partial progress with STGs secondary to limited visits so far.    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    Rehab Potential Good    PT Frequency 2x / week    PT Duration 8 weeks    PT Treatment/Interventions ADLs/Self Care Home Management;Aquatic Therapy;Cryotherapy;Electrical Stimulation;Moist Heat;Iontophoresis 4mg /ml Dexamethasone;Traction;Gait training;Stair training;Functional mobility training;Therapeutic exercise;Therapeutic activities;Balance training;Neuromuscular re-education;Patient/family education;Manual techniques;Joint Manipulations;Spinal Manipulations;Taping;Dry needling;Passive range of motion    PT Next Visit Plan further pelvic floor assessment PRN;  recheck hip passive ROM for STGs;  Progress lumbo/pelvic/hip strengthening ex's; patient especially interested in abdominal strengthening;  review hip hinge for bending over and squatting; DN as needed; manual therapy as needed;  add sit to stand with weight to HEP    PT Home Exercise Plan Access Code: [email or web address]           Patient will benefit from skilled therapeutic intervention in order to improve the following deficits and impairments:  Decreased range of motion, Increased muscle spasms, Pain, Decreased activity tolerance, Impaired flexibility, Decreased balance, Decreased strength, Hypomobility, Decreased mobility, Improper body mechanics  Visit Diagnosis: Pain in left hip  Acute bilateral low back pain without sciatica  Muscle weakness (generalized)     Problem List Patient Active Problem List   Diagnosis Date Noted  . SVD (spontaneous vaginal delivery) 07/28/2011   09/27/2011, PT 09/24/19 12:04 PM Phone: [phone] Fax:  [phone] 09/24/2019, 12:03 PM  [facility] [address] [place], [place], [place] Phone: [phone]   Fax:  [phone]  Name: MEKISHA BITTEL MRN: Cherlyn Roberts Date of Birth: 1980/10/05

## 2019-09-24 NOTE — Patient Instructions (Signed)
Access Code: [email or web address] URL: [email or web address] Date: 09/24/2019 Prepared by: Lavinia Sharps  Exercises Sidelying Transversus Abdominis Bracing - 1 x daily - 7 x weekly - 1 sets - 10 reps - 10 hold Single Leg Stance with Support - 1 x daily - 7 x weekly - 1 sets - 3 reps - 10 hold Stride Stance Weight Shift - 1 x daily - 7 x weekly - 1 sets - 15 reps - 3 hold Forward Step Up - 1 x daily - 7 x weekly - 10 reps - 3 sets Side Lunge Adductor Stretch - 1 x daily - 7 x weekly - 1 sets - 3 reps - 30 hold Supine Figure 4 Piriformis Stretch - 1 x daily - 7 x weekly - 1 sets - 3 reps - 20 hold Seated Piriformis Stretch - 1 x daily - 7 x weekly - 1 sets - 3 reps - 20 hold Supine Hip Adductor Stretch - 1 x daily - 7 x weekly - 1 sets - 3 reps - 20 hold Standing Isometric Hip Abduction with Ball on Wall - 1 x daily - 7 x weekly - 1 sets - 5 reps - 5 hold Bird Dog - 1 x daily - 7 x weekly - 1 sets - 10 reps Standing Hip Hinge with Dowel - 1 x daily - 7 x weekly - 1 sets - 10 reps

## 2019-09-29 ENCOUNTER — Encounter: Payer: Self-pay | Admitting: Physical Therapy

## 2019-09-29 ENCOUNTER — Other Ambulatory Visit: Payer: Self-pay

## 2019-09-29 ENCOUNTER — Ambulatory Visit: Payer: BC Managed Care – PPO | Admitting: Physical Therapy

## 2019-09-29 DIAGNOSIS — M545 Low back pain, unspecified: Secondary | ICD-10-CM

## 2019-09-29 DIAGNOSIS — M25552 Pain in left hip: Secondary | ICD-10-CM | POA: Diagnosis not present

## 2019-09-29 DIAGNOSIS — M6281 Muscle weakness (generalized): Secondary | ICD-10-CM

## 2019-09-29 NOTE — Therapy (Signed)
[facility] [address] [place], [place], [address] Phone: [phone]   Fax:  [phone]  Physical Therapy Treatment  Patient Details  Name: Stephanie [name] MRN: [id number] Date of Birth: 11/30/1980 Referring Provider (PT): Lewis Moccasin, MD   Encounter Date: 09/29/2019   PT End of Session - 09/29/19 1526    Visit Number 4    Date for PT Re-Evaluation 10/14/19    Authorization Type BCBS    Authorization - Visit Number 4    Authorization - Number of Visits 30    PT Start Time 1527    PT Stop Time 1613    PT Time Calculation (min) 46 min    Activity Tolerance Patient tolerated treatment well    Behavior During Therapy [facility] for tasks assessed/performed           Past Medical History:  Diagnosis Date  . Acid reflux   . Anxiety   . Arthritis   . Asthma   . Bipolar depression (HCC)   . Carpal tunnel syndrome   . Depression   . Hypothyroid   . IBS (irritable bowel syndrome)   . Normal pregnancy, first 07/27/2011  . Sinusitis   . SVD (spontaneous vaginal delivery) 07/28/2011  . SVD (spontaneous vaginal delivery) 07/28/2011    Past Surgical History:  Procedure Laterality Date  . extraction of wisdom teeth      There were no vitals filed for this visit.   Subjective Assessment - 09/29/19 1527    Subjective DN has helped the hip pain.  It is still there but less intense.  I still have ongoing incontinence with sneezing since onset of this episode of pain.    Pertinent History PT in past has helped same symptoms with pelvic alignment and exercise, history of constipation (meds have given her loose stool), IBS, psoriatic arthritis    Limitations Standing    How long can you stand comfortably? 5 min    How long can you walk comfortably? symptoms improve with walking    Diagnostic tests xrays at chiro 2021 - negative, pelvic US 2019 negative, lumbar MRI 2018 Mild degenerative disease L4-5 and L5-S1  without central canal foraminal narrowing    Currently in Pain? Yes    Pain Score 4    ranges from 4-7/10, no pelvic pain   Pain Location Hip    Pain Orientation Left    Pain Descriptors / Indicators Sharp    Pain Type Acute pain;Chronic pain    Pain Onset More than a month ago    Pain Frequency Constant                          Pelvic Floor Special Questions - 09/29/19 0001    Prior Pelvic/Prostate Exam Yes    Date of Last Pelvic/Prostate Exam --   last year   Result Pelvic/Prostate Exam  negative, has IUD    Prior Pregnancies Yes    Number of Pregnancies 1    Number of Vaginal Deliveries 1    Any difficulty with labor and deliveries Yes   30 hours of labor, tear and stitches   Currently Sexually Active Yes    Is this Painful No    History of sexually transmitted disease No    Marinoff Scale no problems    Urinary Leakage Yes    How often rare, 5 episodes since Jan    Pad use rarely  Activities that cause leaking Sneezing;Coughing;Laughing    Urinary urgency No    Urinary frequency 5-7x/day    Fecal incontinence No    Fluid intake water    Caffeine beverages no    Falling out feeling (prolapse) Yes    Activities that cause feeling of prolapse on feet for a long time    External Perineal Exam PT explained exam procedure and obtained verbal conset from Pt    Scar Well healed;Restricted    Prolapse Anterior Wall    Pelvic Floor Internal Exam PT explained exam procedure and obtained verbal conset from Pt    Exam Type Vaginal    Sensation intact    Palpation non-tender    Strength fair squeeze, definite lift    Strength # of reps 3   10 quick flicks in 10 sec   Strength # of seconds 2             [facility] Adult PT Treatment/Exercise - 09/29/19 0001      Self-Care   Self-Care Other Self-Care Comments    Other Self-Care Comments  The Knack, Double Voiding, Moisturizer and Lubricants      Neuro Re-ed    Neuro Re-ed Details  PF contractions with cue to  close elevator and slowly travel to 2nd floor of 10 floor building for endurance holds, quick flicks x 10, puffer training intro into closed fist (Pt descends/bulges vs lifts)                    PT Short Term Goals - 09/24/19 1200      PT SHORT TERM GOAL #1   Title Pt will be ind with initial HEP for gentle hip and trunk mobility, stretching, and lumbar/pelvic/hip stability.    Status Achieved      PT SHORT TERM GOAL #2   Title Pt will have internal pelvic floor assessment by pelvic PT to determine strength and function of pelvic floor muscles and incorporate appropriate exercise into HEP.    Time 4    Period Weeks    Status On-going      PT SHORT TERM GOAL #3   Title Pt will achieve P/ROM of Lt hip to at least 110 flexion, 55 ER, 20 IR without pain.    Time 4    Period Weeks    Status On-going      PT SHORT TERM GOAL #4   Title Pt will demo improved closed chain strength of Lt hip with gait and stairs by demo'ing control over + Trendelenburg.    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    Status On-going      PT LONG TERM GOAL #2   Title Pt will demo full trunk and Lt hip mobility for functional tasks such as squatting, bending and rising from floor with pain not to exceed 2/10.    Status On-going      PT LONG TERM GOAL #3   Title Pt will achieve Lt hip and core strength of at least 4+/5 for improved work task performance.    Status On-going      PT LONG TERM GOAL #4  Title Pt will be able to tolerate static standing tasks for at least 30 min with pain not to exceed 2/10.    Status On-going      PT LONG TERM GOAL #5   Title Pt will report resolution of urine and fecal leakage and understand how to recruit and train strength of her PF as part  of her HEP.    Status On-going      Additional Long Term Goals   Additional Long Term Goals Yes      PT LONG TERM GOAL #6   Title Pt will report confidence in use of The Knack with pelvic floor lift vs bulge in anticipation of cough, laugh, sneeze.    Time 4    Period Weeks    Status New    Target Date 10/27/19      PT LONG TERM GOAL #7   Title Pt will report reduced incidence in leakage with aggravating factors by at least 80%    Time 6    Period Weeks    Status New    Target Date 11/10/19                 Plan - 09/29/19 1719    Clinical Impression Statement Pt reports she is having ongoing incontinence episodes since onset of pain.  Leakage occurs with cough, sneeze, laugh although it is rare.  She has used the double void technique discussed since her eval which has helped eliminate final urine and avoid post-void leakage.  PT obtained consent and performed pelvic assessment.  Pt has 3/5 pelvic strength with poor endurance.  She is able to perform 10 quick flicks in 10 sec.  She descends/bulges vs lifts with puffer training which will need to be trained to improve episodes of SUI.  PT educated her on The Knack, PF HEP for endurance and strength, and discussed signs/symptoms/behaviors of prolapse.  Evidence of anterior wall prolapse is present but very mild.  PT will cover constipation and toileting techniques next visit to reduce any straining or undue pressure on pelvic organs/muscles.  Pt will continue to benefit from skilled PT for lumbopelvic strength and stability and manual techniques for ideal alignment and soft tissue tone.    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    PT Frequency 2x / week    PT Duration 8 weeks    PT Treatment/Interventions ADLs/Self Care Home Management;Aquatic  Therapy;Cryotherapy;Electrical Stimulation;Moist Heat;Iontophoresis 4mg /ml Dexamethasone;Traction;Gait training;Stair training;Functional mobility training;Therapeutic exercise;Therapeutic activities;Balance training;Neuromuscular re-education;Patient/family education;Manual techniques;Joint Manipulations;Spinal Manipulations;Taping;Dry needling;Passive range of motion    PT Next Visit Plan core strength, ok to cue PF as Pt has good recruitment and appears to co-contract TrA/PF well, add sit to stand with weight to HEP and review hip hinge, recheck passive ROM of hip for STGs,    PT Home Exercise Plan Access Code: [email or web address]    Consulted and Agree with Plan of Care Patient           Patient will benefit from skilled therapeutic intervention in order to improve the following deficits and impairments:     Visit Diagnosis: Pain in left hip  Acute bilateral low back pain without sciatica  Muscle weakness (generalized)     Problem List Patient Active Problem List   Diagnosis Date Noted  . SVD (spontaneous vaginal delivery) 07/28/2011    09/27/2011, PT 09/29/19 5:29 PM  [facility] [address], [address] [place], [place], [address] Phone: [phone]   Fax:  [phone]  Name: Stephanie [name] MRN: [id number] Date of Birth: [birth year]

## 2019-09-29 NOTE — Patient Instructions (Signed)
°  THE KNACK  The Knack is a strategy you may use to help to reduce or prevent leakage or passing of urine, gas or feces during an activity that causes downward force on the pelvic floor muscles.    Activities that can cause downward pressure on the pelvic floor muscles include coughing, sneezing, laughing, bending, lifting, and transitioning from different body positions such as from laying down to sitting up and sitting to standing.  To perform The Knack, consciously squeeze and lift your pelvic floor muscles to perform a strong, well-timed pelvic muscle contraction BEFORE AND DURING these activities above.  As your contraction gets more coordinated and your muscles get stronger, you will become more effective in controlling your experience of incontinence or gas passing during these activities.     Double Voiding can be a very useful technique to help overcome incomplete emptying of your bladder.  Incomplete emptying of urine can result in leakage after using the bathroom and increase the risk of urinary tract infection.   Initial Void: When you first sit down to urinate, ensure optimal positioning for bladder emptying by following these guidelines for toileting posture: Sit on the toilet seat - dont hover over the seat Support your trunk by placing your hands on your knees or thighs Spread your knees and hips wide Position your feet flat on the floor or elevate feet on phone books, foot stool (Squatty Potty), or wrapped toilet paper rolls (if having knees above hips helps you empty) Lean forward from your hips Maintain the normal inward curve in your lower back   Repeated Void: After your initial void is complete, follow these movement patterns and attempt going to the bathroom again. Stand up Rotate your hips as if doing hula hoop in one direction Rotate using the same action in the other direction Rock your hips and pelvis back and forwards ("pelvic tilts") Rock your hips and pelvis  side to side ("tail wag") Sit back down and repeat your voiding technique This technique can be repeated as many times as you choose to help you empty your bladder more effectively.  PELVIC CONTRACTIONS: "CLOSE ELEVATOR DOOR, GO SLOWLY TO 2ND OR 3RD FLOOR OF 10 STORY BUILDING GOAL: 10X 10 SEC HOLDS WITHOUT SUBSTITUTION, RESPECT FATIGUE (ENDURANCE) QUICK FLICKS 10X EVERY WAKING HOUR TO GET 80-100 REPS IN (STRENGTH/POWER) YOU CAN PERFORM THESE IN ANY POSITION, AGAINST GRAVITY WILL BE MORE DIFFICULT  PUFFER TRAINING: BLOW HARD/FAST INTO CLOSED FIST OVER MOUTH, LIFTING FROM PELVIC FLOOR TO FORCE AIR OUT.  (MAY NOT BE READY FOR THIS IF YOU FEEL BULGING VS LIFTING OF PELVIC FLOOR)

## 2019-10-06 ENCOUNTER — Other Ambulatory Visit: Payer: Self-pay

## 2019-10-06 ENCOUNTER — Ambulatory Visit: Payer: BC Managed Care – PPO | Admitting: Physical Therapy

## 2019-10-06 DIAGNOSIS — M545 Low back pain, unspecified: Secondary | ICD-10-CM

## 2019-10-06 DIAGNOSIS — M25552 Pain in left hip: Secondary | ICD-10-CM

## 2019-10-06 DIAGNOSIS — M6281 Muscle weakness (generalized): Secondary | ICD-10-CM

## 2019-10-06 NOTE — Patient Instructions (Signed)
Access Code: [email or web address] URL: [email or web address] Date: 10/06/2019 Prepared by: Lavinia Sharps  Exercises Sidelying Transversus Abdominis Bracing - 1 x daily - 7 x weekly - 1 sets - 10 reps - 10 hold Single Leg Stance with Support - 1 x daily - 7 x weekly - 1 sets - 3 reps - 10 hold Stride Stance Weight Shift - 1 x daily - 7 x weekly - 1 sets - 15 reps - 3 hold Forward Step Up - 1 x daily - 7 x weekly - 10 reps - 3 sets Side Lunge Adductor Stretch - 1 x daily - 7 x weekly - 1 sets - 3 reps - 30 hold Supine Figure 4 Piriformis Stretch - 1 x daily - 7 x weekly - 1 sets - 3 reps - 20 hold Seated Piriformis Stretch - 1 x daily - 7 x weekly - 1 sets - 3 reps - 20 hold Supine Hip Adductor Stretch - 1 x daily - 7 x weekly - 1 sets - 3 reps - 20 hold Standing Isometric Hip Abduction with Ball on Wall - 1 x daily - 7 x weekly - 1 sets - 5 reps - 5 hold Bird Dog - 1 x daily - 7 x weekly - 1 sets - 10 reps Standing Hip Hinge with Dowel - 1 x daily - 7 x weekly - 1 sets - 10 reps Hip Flexor Stretch at Edge of Bed - 1 x daily - 7 x weekly - 1 sets - 3 reps - 20 hold Supine March with Elevation - 1 x daily - 7 x weekly - 1 sets - 10 reps Seated Transversus Abdominis Bracing - 1 x daily - 7 x weekly - 1 sets - 10 reps Standing Low Shoulder Row with Anchored Resistance - 1 x daily - 7 x weekly - 1 sets - 10 reps Standing Shoulder Extension with Resistance - 1 x daily - 7 x weekly - 1 sets - 10 reps Standing Diagonal Shoulder Extension with Anchored Resistance - 1 x daily - 7 x weekly - 1 sets - 10 reps Access Code: [email or web address] URL: [email or web address] Date: 10/06/2019 Prepared by: Lavinia Sharps  Exercises Sidelying Transversus Abdominis Bracing - 1 x daily - 7 x weekly - 1 sets - 10 reps - 10 hold Single Leg Stance with Support - 1 x daily - 7 x weekly - 1 sets - 3 reps - 10 hold Stride Stance Weight Shift - 1 x daily - 7 x weekly - 1 sets - 15 reps - 3 hold Forward Step Up -  1 x daily - 7 x weekly - 10 reps - 3 sets Side Lunge Adductor Stretch - 1 x daily - 7 x weekly - 1 sets - 3 reps - 30 hold Supine Figure 4 Piriformis Stretch - 1 x daily - 7 x weekly - 1 sets - 3 reps - 20 hold Seated Piriformis Stretch - 1 x daily - 7 x weekly - 1 sets - 3 reps - 20 hold Supine Hip Adductor Stretch - 1 x daily - 7 x weekly - 1 sets - 3 reps - 20 hold Standing Isometric Hip Abduction with Ball on Wall - 1 x daily - 7 x weekly - 1 sets - 5 reps - 5 hold Bird Dog - 1 x daily - 7 x weekly - 1 sets - 10 reps Standing Hip Hinge with Dowel - 1 x daily - 7 x weekly - 1 sets - 10 reps Hip Flexor  Stretch at [facility] of Bed - 1 x daily - 7 x weekly - 1 sets - 3 reps - 20 hold Supine March with Elevation - 1 x daily - 7 x weekly - 1 sets - 10 reps Seated Transversus Abdominis Bracing - 1 x daily - 7 x weekly - 1 sets - 10 reps Standing Low Shoulder Row with Anchored Resistance - 1 x daily - 7 x weekly - 1 sets - 10 reps Standing Shoulder Extension with Resistance - 1 x daily - 7 x weekly - 1 sets - 10 reps Standing Diagonal Shoulder Extension with Anchored Resistance - 1 x daily - 7 x weekly - 1 sets - 10 reps Standing Diagonal Shoulder Extension with Anchored Resistance - 1 x daily - 7 x weekly - 1 sets - 10 reps

## 2019-10-06 NOTE — Therapy (Signed)
[facility] [address], [address] [place], [place], [address] Phone: [phone]   Fax:  [phone]  Physical Therapy Treatment  Patient Details  Name: Stephanie [name] MRN: [id number] Date of Birth: 1980/07/08 Referring Provider (PT): Lewis Moccasin, MD   Encounter Date: 10/06/2019   PT End of Session - 10/06/19 1921    Visit Number 5    Date for PT Re-Evaluation 10/14/19    Authorization Type BCBS    Authorization - Visit Number 5    Authorization - Number of Visits 30    PT Start Time 1530    PT Stop Time 1615    PT Time Calculation (min) 45 min    Activity Tolerance Patient tolerated treatment well           Past Medical History:  Diagnosis Date  . Acid reflux   . Anxiety   . Arthritis   . Asthma   . Bipolar depression (HCC)   . Carpal tunnel syndrome   . Depression   . Hypothyroid   . IBS (irritable bowel syndrome)   . Normal pregnancy, first 07/27/2011  . Sinusitis   . SVD (spontaneous vaginal delivery) 07/28/2011  . SVD (spontaneous vaginal delivery) 07/28/2011    Past Surgical History:  Procedure Laterality Date  . extraction of wisdom teeth      There were no vitals filed for this visit.   Subjective Assessment - 10/06/19 1532    Subjective I could feel it starting in hip while sitting and I did my exercises which helped.    I can unload the dishwasher now which I couldn't do at first.     Pertinent History PT in past has helped same symptoms with pelvic alignment and exercise, history of constipation (meds have given her loose stool), IBS, psoriatic arthritis    Currently in Pain? Yes    Pain Score 4     Pain Location Hip    Pain Orientation Left                             [facility] Adult PT Treatment/Exercise - 10/06/19 0001      Lumbar Exercises: Standing   Row Strengthening;Both;15 reps;Theraband    Theraband Level (Row) Level 3 (Green)    Shoulder Extension  Strengthening;Both;15 reps;Theraband    Theraband Level (Shoulder Extension) Level 3 (Green)    Other Standing Lumbar Exercises kickstand position with green band UE diagonal pull downs 15x right/left       Lumbar Exercises: Seated   Other Seated Lumbar Exercises foam roll push down 20x       Lumbar Exercises: Supine   Ab Set 5 reps    AB Set Limitations green ball isometric     Isometric Hip Flexion 5 reps    Isometric Hip Flexion Limitations green ball discontinued secondary to inc pain    Other Supine Lumbar Exercises march from foot stool 2 minutes       Knee/Hip Exercises: Stretches   Hip Flexor Stretch Left;2 reps;20 seconds    Hip Flexor Stretch Limitations supine over side of the bed       Moist Heat Therapy   Number Minutes Moist Heat 3 Minutes    Moist Heat Location Hip      Manual Therapy   Soft tissue mobilization left medial and anterior quads             Trigger Point Dry  Needling - 10/06/19 0001    Consent Given? Yes    Other Dry Needling left only    Quadriceps Response Palpable increased muscle length    Rectus femoris Response Palpable increased muscle length                PT Education - 10/06/19 1920    Education Details supine left hip flexor stretch over the side of the bed; green band rows, band shoulder extensions; UE diagonals with kickstand position; supine march from step stool;  seated transverse abdominus activation    Person(s) Educated Patient    Methods Explanation;Demonstration;Handout    Comprehension Returned demonstration;Verbalized understanding            PT Short Term Goals - 09/24/19 1200      PT SHORT TERM GOAL #1   Title Pt will be ind with initial HEP for gentle hip and trunk mobility, stretching, and lumbar/pelvic/hip stability.    Status Achieved      PT SHORT TERM GOAL #2   Title Pt will have internal pelvic floor assessment by pelvic PT to determine strength and function of pelvic floor muscles and incorporate  appropriate exercise into HEP.    Time 4    Period Weeks    Status On-going      PT SHORT TERM GOAL #3   Title Pt will achieve P/ROM of Lt hip to at least 110 flexion, 55 ER, 20 IR without pain.    Time 4    Period Weeks    Status On-going      PT SHORT TERM GOAL #4   Title Pt will demo improved closed chain strength of Lt hip with gait and stairs by demo'ing control over + Trendelenburg.    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    Status On-going      PT LONG TERM GOAL #2   Title Pt will demo full trunk and Lt hip mobility for functional tasks such as squatting, bending and rising from floor with pain not to exceed 2/10.    Status On-going      PT LONG TERM GOAL #3   Title Pt will achieve Lt hip and core strength of at least 4+/5 for improved work task performance.    Status On-going      PT LONG TERM GOAL #4   Title Pt will be able to tolerate static standing tasks for at least 30 min with pain not to exceed 2/10.    Status On-going      PT LONG TERM GOAL #5   Title Pt will report resolution of urine and fecal leakage and understand how to recruit and train strength of her PF as part of her HEP.    Status On-going      Additional Long Term Goals   Additional Long Term Goals Yes      PT LONG TERM GOAL #6   Title Pt will report confidence in use of The Knack with pelvic floor lift vs bulge in anticipation of cough, laugh, sneeze.    Time 4    Period Weeks    Status New    Target  Date 10/27/19      PT LONG TERM GOAL #7   Title Pt will report reduced incidence in leakage with aggravating factors by at least 80%    Time 6    Period Weeks    Status New    Target Date 11/10/19                 Plan - 10/06/19 1553    Clinical Impression  Statement The patient has a positive response to supine hip flexor stretching.  She has an exacerbation of pain with hand to knee isometric pushes to activate transverse abdominus but no worse with elevated LEs on stool marching or with seated or standing ex's.  Medial quad tender points improved immediately following DN and soft tissue mob.  Therapist monitoring response with all and modifying accordingly.    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    Rehab Potential Good    PT Frequency 2x / week    PT Duration 8 weeks    PT Treatment/Interventions ADLs/Self Care Home Management;Aquatic Therapy;Cryotherapy;Electrical Stimulation;Moist Heat;Iontophoresis 4mg /ml Dexamethasone;Traction;Gait training;Stair training;Functional mobility training;Therapeutic exercise;Therapeutic activities;Balance training;Neuromuscular re-education;Patient/family education;Manual techniques;Joint Manipulations;Spinal Manipulations;Taping;Dry needling;Passive range of motion    PT Next Visit Plan core strength, ok to cue PF as Pt has good recruitment and appears to co-contract TrA/PF well,  recheck passive ROM of hip for STGs,    PT Home Exercise Plan Access Code: [email or web address]           Patient will benefit from skilled therapeutic intervention in order to improve the following deficits and impairments:  Decreased range of motion, Increased muscle spasms, Pain, Decreased activity tolerance, Impaired flexibility, Decreased balance, Decreased strength, Hypomobility, Decreased mobility, Improper body mechanics  Visit Diagnosis: Pain in left hip  Acute bilateral low back pain without sciatica  Muscle weakness (generalized)     Problem List Patient Active Problem List   Diagnosis Date Noted  . SVD (spontaneous vaginal delivery) 07/28/2011   09/27/2011,  PT 10/06/19 7:50 PM Phone: [phone] Fax: [phone] 10/06/2019, 7:49 PM  [facility] [address] [place], [place], [place] Phone: [phone]   Fax:  [phone]  Name: Stephanie [name] MRN: Cherlyn Roberts Date of Birth: [birth year]

## 2019-10-08 ENCOUNTER — Encounter: Payer: Self-pay | Admitting: Physical Therapy

## 2019-10-08 ENCOUNTER — Other Ambulatory Visit: Payer: Self-pay

## 2019-10-08 ENCOUNTER — Ambulatory Visit: Payer: BC Managed Care – PPO | Admitting: Physical Therapy

## 2019-10-08 DIAGNOSIS — M6281 Muscle weakness (generalized): Secondary | ICD-10-CM

## 2019-10-08 DIAGNOSIS — M545 Low back pain, unspecified: Secondary | ICD-10-CM

## 2019-10-08 DIAGNOSIS — M25552 Pain in left hip: Secondary | ICD-10-CM | POA: Diagnosis not present

## 2019-10-08 NOTE — Therapy (Signed)
[facility] [address], [address] [place], [place], [address] Phone: [phone]   Fax:  [phone]  Physical Therapy Treatment  Patient Details  Name: Stephanie [name] MRN: [id number] Date of Birth: 12/12/1980 Referring Provider (PT): Lewis Moccasin, MD   Encounter Date: 10/08/2019   PT End of Session - 10/08/19 1702    Visit Number 6    Date for PT Re-Evaluation 10/14/19    Authorization Type BCBS    Authorization - Visit Number 6    Authorization - Number of Visits 30    PT Start Time 1614    PT Stop Time 1656    PT Time Calculation (min) 42 min    Activity Tolerance Patient tolerated treatment well           Past Medical History:  Diagnosis Date  . Acid reflux   . Anxiety   . Arthritis   . Asthma   . Bipolar depression (HCC)   . Carpal tunnel syndrome   . Depression   . Hypothyroid   . IBS (irritable bowel syndrome)   . Normal pregnancy, first 07/27/2011  . Sinusitis   . SVD (spontaneous vaginal delivery) 07/28/2011  . SVD (spontaneous vaginal delivery) 07/28/2011    Past Surgical History:  Procedure Laterality Date  . extraction of wisdom teeth      There were no vitals filed for this visit.   Subjective Assessment - 10/08/19 1617    Subjective That spot is a lot better.  I want to try that ball isometric again.    Pertinent History PT in past has helped same symptoms with pelvic alignment and exercise, history of constipation (meds have given her loose stool), IBS, psoriatic arthritis    Currently in Pain? Yes    Pain Score 2               [facility] PT Assessment - 10/08/19 0001      AROM   Left Hip Flexion 136    Left Hip External Rotation  55    Left Hip Internal Rotation  20    Lumbar Flexion 68    Lumbar Extension 20    Lumbar - Right Side Bend 30    Lumbar - Left Side Bend 38                         [facility] Adult PT Treatment/Exercise - 10/08/19 0001      Lumbar  Exercises: Supine   Other Supine Lumbar Exercises march from foot stool 2 minutes     Other Supine Lumbar Exercises LEs on stool with holding beach ball isometrics and overhead       Lumbar Exercises: Quadruped   Other Quadruped Lumbar Exercises 1/2 kneel red band UE horizontal abduction and diagonals 5x each right/left       Knee/Hip Exercises: Stretches   Hip Flexor Stretch Left;2 reps;20 seconds    Hip Flexor Stretch Limitations supine over side of the bed     Other Knee/Hip Stretches 1/2 kneel overhead UE and sidebend for hip flexor stretch 5x each right/left       Knee/Hip Exercises: Sidelying   Clams 15x with ball between feet     Other Sidelying Knee/Hip Exercises reverse clams with ball between knees 15x       Manual Therapy   Soft tissue mobilization Addaday yellow atttach instrument assist to left quads 4 min  PT Education - 10/08/19 1657    Education Details supine beach ball core exs with feet on elevated surface/stool;  sidelying clams with ball between feet and reverse clams with ball between knees, 1/2 kneel hip flexor stretch and core ex    Person(s) Educated Patient    Methods Explanation;Demonstration;Handout    Comprehension Returned demonstration;Verbalized understanding            PT Short Term Goals - 09/24/19 1200      PT SHORT TERM GOAL #1   Title Pt will be ind with initial HEP for gentle hip and trunk mobility, stretching, and lumbar/pelvic/hip stability.    Status Achieved      PT SHORT TERM GOAL #2   Title Pt will have internal pelvic floor assessment by pelvic PT to determine strength and function of pelvic floor muscles and incorporate appropriate exercise into HEP.    Time 4    Period Weeks    Status On-going      PT SHORT TERM GOAL #3   Title Pt will achieve P/ROM of Lt hip to at least 110 flexion, 55 ER, 20 IR without pain.    Time 4    Period Weeks    Status On-going      PT SHORT TERM GOAL #4   Title Pt will  demo improved closed chain strength of Lt hip with gait and stairs by demo'ing control over + Trendelenburg.    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    Status On-going      PT LONG TERM GOAL #2   Title Pt will demo full trunk and Lt hip mobility for functional tasks such as squatting, bending and rising from floor with pain not to exceed 2/10.    Status On-going      PT LONG TERM GOAL #3   Title Pt will achieve Lt hip and core strength of at least 4+/5 for improved work task performance.    Status On-going      PT LONG TERM GOAL #4   Title Pt will be able to tolerate static standing tasks for at least 30 min with pain not to exceed 2/10.    Status On-going      PT LONG TERM GOAL #5   Title Pt will report resolution of urine and fecal leakage and understand how to recruit and train strength of her PF as part of her HEP.    Status On-going      Additional Long Term Goals   Additional Long Term Goals Yes      PT LONG TERM GOAL #6   Title Pt will report confidence in use of The Knack with pelvic floor lift vs bulge in anticipation of cough, laugh, sneeze.    Time 4    Period Weeks    Status New    Target Date 10/27/19      PT LONG TERM GOAL #7   Title Pt will report reduced incidence in leakage with aggravating factors by at least 80%    Time 6    Period Weeks    Status New    Target Date 11/10/19  Plan - 10/08/19 1639    Clinical Impression Statement The patient has much improved lumbar ROM and left hip ROM.  Improving left hip flexor length although with endrange stretching in the half kneeling position.  Decreased pain noted although full supine "dead bug" leg lifts will produce pain.  She is able to do the exercise painfree with  LEs elevated on a stool.  Therapist monitoring response and modifying as needed.    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    Rehab Potential Good    PT Frequency 2x / week    PT Duration 8 weeks    PT Treatment/Interventions ADLs/Self Care Home Management;Aquatic Therapy;Cryotherapy;Electrical Stimulation;Moist Heat;Iontophoresis 4mg /ml Dexamethasone;Traction;Gait training;Stair training;Functional mobility training;Therapeutic exercise;Therapeutic activities;Balance training;Neuromuscular re-education;Patient/family education;Manual techniques;Joint Manipulations;Spinal Manipulations;Taping;Dry needling;Passive range of motion    PT Next Visit Plan core strength, ok to cue PF as Pt has good recruitment and appears to co-contract TrA/PF well,  ERO next visit.  She may be ready to decrease treatment frequency but requests to finish before September when her insurance changes    PT Home Exercise Plan Access Code: [email or web address]           Patient will benefit from skilled therapeutic intervention in order to improve the following deficits and impairments:  Decreased range of motion, Increased muscle spasms, Pain, Decreased activity tolerance, Impaired flexibility, Decreased balance, Decreased strength, Hypomobility, Decreased mobility, Improper body mechanics  Visit Diagnosis: Pain in left hip  Acute bilateral low back pain without sciatica  Muscle weakness (generalized)     Problem List Patient Active Problem List   Diagnosis Date Noted  . SVD (spontaneous vaginal delivery) 07/28/2011   09/27/2011, PT 10/08/19 5:10 PM Phone: [phone] Fax: [phone] 10/08/2019, 5:09 PM  [facility] [address] [place], [place], [place] Phone:  [phone]   Fax:  [phone]  Name: Stephanie [name] MRN: Cherlyn Roberts Date of Birth: 11-Oct-1980

## 2019-10-08 NOTE — Patient Instructions (Signed)
Access Code: [email or web address] URL: [email or web address] Date: 10/08/2019 Prepared by: Lavinia Sharps  Exercises Sidelying Transversus Abdominis Bracing - 1 x daily - 7 x weekly - 1 sets - 10 reps - 10 hold Single Leg Stance with Support - 1 x daily - 7 x weekly - 1 sets - 3 reps - 10 hold Stride Stance Weight Shift - 1 x daily - 7 x weekly - 1 sets - 15 reps - 3 hold Forward Step Up - 1 x daily - 7 x weekly - 10 reps - 3 sets Side Lunge Adductor Stretch - 1 x daily - 7 x weekly - 1 sets - 3 reps - 30 hold Supine Figure 4 Piriformis Stretch - 1 x daily - 7 x weekly - 1 sets - 3 reps - 20 hold Seated Piriformis Stretch - 1 x daily - 7 x weekly - 1 sets - 3 reps - 20 hold Supine Hip Adductor Stretch - 1 x daily - 7 x weekly - 1 sets - 3 reps - 20 hold Standing Isometric Hip Abduction with Ball on Wall - 1 x daily - 7 x weekly - 1 sets - 5 reps - 5 hold Bird Dog - 1 x daily - 7 x weekly - 1 sets - 10 reps Standing Hip Hinge with Dowel - 1 x daily - 7 x weekly - 1 sets - 10 reps Hip Flexor Stretch at Edge of Bed - 1 x daily - 7 x weekly - 1 sets - 3 reps - 20 hold Supine March with Elevation - 1 x daily - 7 x weekly - 1 sets - 10 reps Seated Transversus Abdominis Bracing - 1 x daily - 7 x weekly - 1 sets - 10 reps Standing Low Shoulder Row with Anchored Resistance - 1 x daily - 7 x weekly - 1 sets - 10 reps Standing Shoulder Extension with Resistance - 1 x daily - 7 x weekly - 1 sets - 10 reps Standing Diagonal Shoulder Extension with Anchored Resistance - 1 x daily - 7 x weekly - 1 sets - 10 reps Standing Diagonal Shoulder Extension with Anchored Resistance - 1 x daily - 7 x weekly - 1 sets - 10 reps Clamshell - 1 x daily - 7 x weekly - 1 sets - 10 reps Sidelying Reverse Clamshell - 1 x daily - 7 x weekly - 1 sets - 10 reps Half Kneeling Hip Flexor Stretch with 3-Way Reach - 1 x daily - 7 x weekly - 1 sets - 10 reps Half Kneeling Hip Flexor Stretch with Sidebend - 1 x daily - 7 x weekly  - 1 sets - 10 reps Half-Kneeling Shoulder Horizontal Abduction with Resistance - 1 x daily - 7 x weekly - 3 sets - 10 reps Kneeling Diagonal Chop with Anchored Resistance - 1 x daily - 7 x weekly - 3 sets - 10 reps Supine Alternating Knee Taps with Hands - 1 x daily - 7 x weekly - 1 sets - 10 reps

## 2019-10-09 DIAGNOSIS — K5900 Constipation, unspecified: Secondary | ICD-10-CM | POA: Diagnosis not present

## 2019-10-09 DIAGNOSIS — K581 Irritable bowel syndrome with constipation: Secondary | ICD-10-CM | POA: Diagnosis not present

## 2019-10-09 DIAGNOSIS — M25559 Pain in unspecified hip: Secondary | ICD-10-CM | POA: Diagnosis not present

## 2019-10-09 DIAGNOSIS — K219 Gastro-esophageal reflux disease without esophagitis: Secondary | ICD-10-CM | POA: Diagnosis not present

## 2019-10-13 ENCOUNTER — Other Ambulatory Visit: Payer: Self-pay

## 2019-10-13 ENCOUNTER — Encounter: Payer: Self-pay | Admitting: Physical Therapy

## 2019-10-13 ENCOUNTER — Ambulatory Visit: Payer: BC Managed Care – PPO | Admitting: Physical Therapy

## 2019-10-13 DIAGNOSIS — M25552 Pain in left hip: Secondary | ICD-10-CM | POA: Diagnosis not present

## 2019-10-13 DIAGNOSIS — M545 Low back pain, unspecified: Secondary | ICD-10-CM

## 2019-10-13 DIAGNOSIS — M6281 Muscle weakness (generalized): Secondary | ICD-10-CM

## 2019-10-13 NOTE — Therapy (Signed)
[facility] [address], [place], [place], [address] Phone: [phone]   Fax:  [phone]  Physical Therapy Treatment  Patient Details  Name: Stephanie [name] MRN: [id number] Date of Birth: 1980/05/21 Referring Provider (PT): Fanny Bien, MD   Encounter Date: 10/13/2019   PT End of Session - 10/13/19 1532    Visit Number 7    Date for PT Re-Evaluation 10/20/19   Pt ready for d/c after 2 more sessions   Authorization Type BCBS    Authorization - Visit Number 7    Authorization - Number of Visits 30    PT Start Time 1532    PT Stop Time 1616    PT Time Calculation (min) 44 min    Activity Tolerance Patient tolerated treatment well    Behavior During Therapy [facility] for tasks assessed/performed           Past Medical History:  Diagnosis Date  . Acid reflux   . Anxiety   . Arthritis   . Asthma   . Bipolar depression ([place])   . Carpal tunnel syndrome   . Depression   . Hypothyroid   . IBS (irritable bowel syndrome)   . Normal pregnancy, first 07/27/2011  . Sinusitis   . SVD (spontaneous vaginal delivery) 07/28/2011  . SVD (spontaneous vaginal delivery) 07/28/2011    Past Surgical History:  Procedure Laterality Date  . extraction of wisdom teeth      There were no vitals filed for this visit.   Subjective Assessment - 10/13/19 1533    Subjective I am getting more endurance in my PF muscles - I practice them throughout the day.  Only one episode of leakage since last visit. I am using the Knack and have had opportunities to leak but didn't so I think it is helping.  Pt reports 60% improvement in pain since starting PT.    Pertinent History PT in past has helped same symptoms with pelvic alignment and exercise, history of constipation (meds have given her loose stool), IBS, psoriatic arthritis    Limitations Standing    How long can you stand comfortably? with weight shifting, 30 min    How long can  you walk comfortably? symptoms improve with walking    Diagnostic tests xrays at chiro 2021 - negative, pelvic US 2019 negative, lumbar MRI 2018 Mild degenerative disease L4-5 and L5-S1 without central canal foraminal narrowing    Currently in Pain? Yes    Pain Score 3     Pain Location Hip    Pain Orientation Left;Lateral    Pain Descriptors / Indicators Sore    Pain Type Chronic pain    Pain Radiating Towards localized to lateral Lt hip    Pain Onset More than a month ago    Pain Frequency Intermittent    Aggravating Factors  stepping up onto bleacher with Lt LE    Effect of Pain on Daily Activities climbing bleachers at school, getting up/down from floor is better, dishwasher is better              [facility] PT Assessment - 10/13/19 0001      AROM   Left Hip Flexion 136    Left Hip External Rotation  55    Left Hip Internal Rotation  20    Lumbar Flexion 68    Lumbar Extension 20    Lumbar - Right Side Bend 30    Lumbar - Left Side Bend 38  Strength   Overall Strength Comments Lt hip 4+/5 flexion and bil rotation with pain, all other muscle groups 5/5      Palpation   SI assessment  Lt SI: no unlocking with squat, stairs, gait, SLS                         [facility] Adult PT Treatment/Exercise - 10/13/19 0001      Self-Care   Self-Care Other Self-Care Comments    Other Self-Care Comments  toileting techniques, bowel routine, external splinting for bowel movements/rectocele symptoms, fiber (soluble vs insoluble)      Neuro Re-ed    Neuro Re-ed Details  diaphragmatic breathing and bulge of PF in sitting for toileting technique for defecation      Knee/Hip Exercises: Standing   Lateral Step Up Left;1 set;10 reps;Hand Hold: 1;Step Height: 6"    Forward Step Up 1 set;10 reps;Step Height: 6";Left;Hand Hold: 1                    PT Short Term Goals - 10/13/19 1540      PT SHORT TERM GOAL #1   Title Pt will be ind with initial HEP for gentle hip  and trunk mobility, stretching, and lumbar/pelvic/hip stability.    Status Achieved      PT SHORT TERM GOAL #2   Title Pt will have internal pelvic floor assessment by pelvic PT to determine strength and function of pelvic floor muscles and incorporate appropriate exercise into HEP.    Status Achieved      PT SHORT TERM GOAL #3   Title Pt will achieve P/ROM of Lt hip to at least 110 flexion, 55 ER, 20 IR without pain.    Status Achieved      PT SHORT TERM GOAL #4   Title Pt will demo improved closed chain strength of Lt hip with gait and stairs by demo'ing control over + Trendelenburg.             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    Status Achieved      PT LONG TERM GOAL #2   Title Pt will demo full trunk and Lt hip mobility for functional tasks such as squatting, bending and rising from floor with pain not to exceed 2/10.    Status Achieved      PT LONG TERM GOAL #3   Title Pt will achieve Lt hip and core strength of at least 4+/5 for improved work task performance.    Baseline still some pain with hip flexion and bil rot resistance    Status Achieved      PT LONG TERM GOAL #4   Title Pt will be able to tolerate static standing tasks for at least 30 min with pain not to exceed 2/10.    Baseline uses weight shifting strategy    Status Achieved      PT LONG TERM GOAL #5   Title Pt will report resolution of urine and fecal leakage and understand how to recruit and train strength of her PF as part of her HEP.    Baseline no leakage since 1.5 weeks ago    Status On-going      PT LONG TERM GOAL #6  Title Pt will report confidence in use of The Knack with pelvic floor lift vs bulge in anticipation of cough, laugh, sneeze.    Status Achieved      PT LONG TERM GOAL #7   Title Pt will report reduced incidence  in leakage with aggravating factors by at least 80%    Baseline met x 1.5 weeks, will look for futher consistency over next few visits    Status On-going                 Plan - 10/13/19 1719    Clinical Impression Statement Pt has nearly met all LTGs.  She reports at least 60% improvement in pain overall.  She has some remaining pain on isometric strength testing of Lt hip for flexion, IR, ER.  Her pain is localized and she can point to it with one finger (glut med/piriformis tendon insertion).  She demonstrates much improved Lt SI stability with squat, stairs and SLS.  She noted pain with stepping up onto high bleacher this week but otherwise hasn't had much pain.  PT progressed HEP to include forward and lateral step up for improved glut strength, and encouraged her to engage her gluts more with gait.  Pt also reports improved endurance of her PF muscles and no incidence in leakage with aggravating factors x 1.5 weeks which is a big improvement.  PT spend much time today on self-care pelvic floor topics inlcuding toileting posture and technique for defecation to reduce straining, external spinting, fiber intake and options, and establishing a bowel routine.  Pt is very pleased with her progress but needs 2 final visits to finalize HEP and get underway with pelvic floor techniques and behavioral strategies.    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    PT Frequency 2x / week    PT Duration 8 weeks    PT Treatment/Interventions ADLs/Self Care Home Management;Aquatic Therapy;Cryotherapy;Electrical Stimulation;Moist Heat;Iontophoresis 76m/ml Dexamethasone;Traction;Gait training;Stair training;Functional mobility training;Therapeutic exercise;Therapeutic activities;Balance training;Neuromuscular re-education;Patient/family education;Manual  techniques;Joint Manipulations;Spinal Manipulations;Taping;Dry needling;Passive range of motion    PT Next Visit Plan finalize HEP (Marz[place] Board, follow up on pelvic floor Pt education from last visit (Venetia Night, d/c after these two visits.    PT Home Exercise Plan Access Code: [email or web address]   Consulted and Agree with Plan of Care Patient           Patient will benefit from skilled therapeutic intervention in order to improve the following deficits and impairments:     Visit Diagnosis: Pain in left hip - Plan: PT plan of care cert/re-cert  Acute bilateral low back pain without sciatica - Plan: PT plan of care cert/re-cert  Muscle weakness (generalized) - Plan: PT plan of care cert/re-cert     Problem List Patient Active Problem List   Diagnosis Date Noted  . SVD (spontaneous vaginal delivery) 07/28/2011    JBaruch Merl PT 10/13/19 5:28 PM   [facility] [address] [place] [address]: [phone]  Fax:  [phone] Name: Stephanie LASKEMRN: [id number] of Birth: 5October 03, 1982

## 2019-10-13 NOTE — Patient Instructions (Addendum)
Toileting Techniques for Bowel Movements    An Evacuation/Defecation Plan   Here are the 4 basic points:  1. Lean forward enough for your elbows to rest on your knees 2. Support your feet on the floor or use a low stool if your feet don't touch the floor  3. Push out your belly as if you have swallowed a beach ball--you should feel a widening of your waist. "Belly Big, Belly Hard" 4. Open and relax your pelvic floor muscles, rather than tightening around the anus  While you are sitting on the toilet pay attention to the following areas: . Jaw and mouth position- relaxed not clenched . Angle of your hips - leaning slightly forward . Whether your feet touch the ground or not - should be flat and supported . Arm placement - rest against your thighs . Spine position - flat back . Waist . Breathing - exhale as you push (like blowing up a balloon or try using other sounds such as ahhhh, shhhhh, ohhhh or grrrrrrr) . Belly - hard and tight as you push . Anus (opening of the anal canal) - relaxed and open as you push . Anus - Tighten and lift pulling the muscle back in after you are done or if taking a break  If you are not successful after 10-15 minutes, try again later.  Avoid negative self-talk about your toileting experience.   Read this for more details and ask your PT if you need suggestions for adjustments or limitations:  1) Sitting on the toilet  a) Make sure your feet are supported - flat on the floor or step stool b) Many people find it effective to lean forward or raise their knees.  Propping your feet on a step stool (Squatty Potty is a brand name) can help the muscles around the anus to relax  c) When you lean forward, place your forearms on your thighs for support  2) Relaxing a) Breathe deeply and slowly in through your nose and out through your mouth. b) To become aware of how to relax your muscles, contracting and releasing muscles can be helpful.  Pull your pelvic floor  muscles in tightly by using the image of holding back gas, or closing around the anus (visualize making a circle smaller) and lifting the anus up and in.  Then release the muscles and your anus should drop down and feel open. Repeat 5 times ending with the feeling of relaxation. c) Keep your pelvic floor muscles relaxed; let your belly bulge out. d) The digestive tract starts at the mouth and ends at the anal opening, so be sure to relax both ends of the tube.  Place your tongue on the roof of your mouth with your teeth separated.  This helps relax your mouth and will help to relax the anus at the same time.  3) Emptying (defecation) a) Keep your pelvic floor and sphincter relaxed, then bulge your anal muscles.  Make the anal opening wide.  b) Stick your belly out as if you have swallowed a beach ball. c) Make your belly wall hard using your belly muscles while continuing to breathe. Doing this makes it easier to open your anus. d) Breath out and give a grunt (or try using other sounds such as ahhhh, shhhhh, ohhhh or grrrrrrr). e)  Can also try to act as if you are blowing up a balloon as you push  4) Finishing a) As you finish your bowel movement, pull the pelvic floor muscles  up and in.  This will leave your anus in the proper place rather than remaining pushed out and down. If you leave your anus pushed out and down, it will start to feel as though that is normal and give you incorrect signals about needing to have a bowel movement.    Strategies for Constipation - Developing a Bowel Routine  Bowels love routine.  Choose the best time of day to have a bowel movement.  Usually the best time of day for a bowel movement in 30 min to 1 hour after breakfast or lunch.  It can take a week or longer to transition to new bowel routines, but if you follow the guidelines below, you will start to retrain your system for more successful bowel routines.  Eat breakfast every morning to try to get things  moving along.  Eating stimulates the bowels through turning on the gastrocolic reflex, which are the wave-like contractions of smooth muscle in your intestines to help move feces along.  Adding in a hot beverage with breakfast may help as well.  Think of your digestive tract as being a conveyor belt - new food comes in and pushes along "old food," with the end of the line being a bowel movement.  You may add 1/4 cup or 1/3 cup of prune juice every evening to help stimulate morning bowel function until you see more successful bowel function.  Create routine in both your timing and portions of intake of food and drink throughout the day.  Eat each of your meals at consistent times of the day.  For portions, the size of each meal may vary, but try to eat consistent portions each day at breakfast, lunch and dinner.  For example, you may have a small breakfast every day and a big lunch every day. This is fine if it is a consistent pattern of intake.  With each meal, aim for at least 2 servings of fruits and vegetables and at least one serving of whole grains (whole grain cereal, brown rice, bran, whole wheat or rye bread, oatmeal) to get some fiber in your system.  These foods provide soft bulk for the bowel.  Drink water.  Aim for at least 8 large glasses of water a day.  Water helps the stool stay softer and easier to pass.  If you are adding more fiber in your diet, be sure to increase your water intake too.  Exercise can help move things along the digestive tract.  Exercise may be a walk, jog, yoga, exercise video, gym time, or even household walking or marching in place at the kitchen countertop.  Pairing exercise and breakfast as part of your morning routine may help establish a morning bowel routine.  Listen to your body's signals that it is time to go to the bathroom!  Our body has automatic reflexes that signal when it is time to have a bowel movement.  If you are establishing routines to help stimulate  your bowels, be sure to allow time for toileting.  For example, if you typically have a bowel movement after breakfast, get up early enough to eat AND empty your bowels before your schedule gets too busy or before it becomes inconvenient to access a bathroom.  If we routinely delay or hold back a bowel movement, these reflexes can stop signaling, leading to constipation which may become chronic (long-term).    Ensure proper toileting technique including posture, breathing and emptying strategies.*  Abdominal massage may also help  stimulate your bowels and reduce the discomfort that accompanies constipation.*  *Your PT can educate you on toileting strategies and self-massage for your abdomen.  Your PT may also ask you to complete a food log and bowel diary for 1-3 days to help identify patterns and make suggestions for increased success in managing your symptoms.

## 2019-10-15 ENCOUNTER — Encounter: Payer: Self-pay | Admitting: Physical Therapy

## 2019-10-15 ENCOUNTER — Ambulatory Visit: Payer: BC Managed Care – PPO | Admitting: Physical Therapy

## 2019-10-15 ENCOUNTER — Other Ambulatory Visit: Payer: Self-pay

## 2019-10-15 DIAGNOSIS — M25552 Pain in left hip: Secondary | ICD-10-CM | POA: Diagnosis not present

## 2019-10-15 DIAGNOSIS — M545 Low back pain, unspecified: Secondary | ICD-10-CM

## 2019-10-15 DIAGNOSIS — M6281 Muscle weakness (generalized): Secondary | ICD-10-CM

## 2019-10-15 NOTE — Therapy (Signed)
[facility] [address] [place], [place], [address] Phone: [phone]   Fax:  [phone]  Physical Therapy Treatment  Patient Details  Name: Stephanie [name] MRN: [id number] Date of Birth: [birth year] Referring Provider (PT): Lewis Moccasin, MD   Encounter Date: 10/15/2019   PT End of Session - 10/15/19 1713    Visit Number 8    Date for PT Re-Evaluation 10/20/19    Authorization Type BCBS    Authorization - Visit Number 8    Authorization - Number of Visits 30    PT Start Time 1615    PT Stop Time 1655    PT Time Calculation (min) 40 min    Activity Tolerance Patient tolerated treatment well           Past Medical History:  Diagnosis Date  . Acid reflux   . Anxiety   . Arthritis   . Asthma   . Bipolar depression ([facility])   . Carpal tunnel syndrome   . Depression   . Hypothyroid   . IBS (irritable bowel syndrome)   . Normal pregnancy, first 07/27/2011  . Sinusitis   . SVD (spontaneous vaginal delivery) 07/28/2011  . SVD (spontaneous vaginal delivery) 07/28/2011    Past Surgical History:  Procedure Laterality Date  . extraction of wisdom teeth      There were no vitals filed for this visit.                      [facility] Adult PT Treatment/Exercise - 10/15/19 0001      Lumbar Exercises: Standing   Other Standing Lumbar Exercises 10# dead lifts to mid shin level 10x       Lumbar Exercises: Seated   Other Seated Lumbar Exercises comprehensive review of HEP and discussion of areas of focus and frequency       Lumbar Exercises: Supine   Other Supine Lumbar Exercises march from foot stool 2 minutes     Other Supine Lumbar Exercises beach ball isometric holds and lift offs       Lumbar Exercises: Quadruped   Opposite Arm/Leg Raise Right arm/Left leg;Left arm/Right leg;10 reps    Other Quadruped Lumbar Exercises 1/2 kneel red band UE horizontal abduction and diagonals 5x each  right/left       Knee/Hip Exercises: Stretches   Hip Flexor Stretch Limitations supine over side of the bed     Other Knee/Hip Stretches 1/2 kneel overhead UE and sidebend for hip flexor stretch 5x each right/left       Knee/Hip Exercises: Standing   Lateral Step Up Left;1 set;10 reps;Hand Hold: 1;Step Height: 6"    Forward Step Up 1 set;10 reps;Step Height: 6";Left;Hand Hold: 1                  PT Education - 10/15/19 1707    Education Details dead lifts    Person(s) Educated Patient    Methods Explanation;Demonstration;Handout    Comprehension Returned demonstration;Verbalized understanding            PT Short Term Goals - 10/15/19 1718      PT SHORT TERM GOAL #1   Title Pt will be ind with initial HEP for gentle hip and trunk mobility, stretching, and lumbar/pelvic/hip stability.    Status Achieved      PT SHORT TERM GOAL #2   Title Pt will have internal pelvic floor assessment by pelvic PT to determine strength and function of  pelvic floor muscles and incorporate appropriate exercise into HEP.    Status Achieved      PT SHORT TERM GOAL #3   Title Pt will achieve P/ROM of Lt hip to at least 110 flexion, 55 ER, 20 IR without pain.    Status Achieved      PT SHORT TERM GOAL #4   Title Pt will demo improved closed chain strength of Lt hip with gait and stairs by demo'ing control over + Trendelenburg.    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    Status Achieved      PT LONG TERM GOAL #2   Title Pt will demo full trunk and Lt hip mobility for functional tasks such as squatting, bending and rising from floor with pain not to exceed 2/10.    Status Achieved      PT LONG TERM GOAL #3   Title Pt will achieve Lt hip and core strength of at least 4+/5 for improved work task  performance.    Status Achieved      PT LONG TERM GOAL #4   Title Pt will be able to tolerate static standing tasks for at least 30 min with pain not to exceed 2/10.    Status Achieved      PT LONG TERM GOAL #5   Title Pt will report resolution of urine and fecal leakage and understand how to recruit and train strength of her PF as part of her HEP.    Time 8    Period Weeks    Status On-going      PT LONG TERM GOAL #6   Title Pt will report confidence in use of The Knack with pelvic floor lift vs bulge in anticipation of cough, laugh, sneeze.    Status Achieved      PT LONG TERM GOAL #7   Title Pt will report reduced incidence in leakage with aggravating factors by at least 80%    Time 6    Period Weeks    Status On-going                 Plan - 10/15/19 1618    Clinical Impression Statement The patient reports significantly less left hip pain recently.  Mild anterior thigh discomfort only during ex's and improved with hip flexor stretching.  She demonstrates good carryover with HEP with minimal verbal cues needed (primarily with dead lift to encourage sticking her backside out) for better technique.  Encouraged continued modification in supine with LEs on an elevated surface.  Discussed progressions of intensity over time.  Anticipate readiness for discharge from PT to an independent HEP and self care next visit.    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    Rehab Potential Good    PT Frequency 2x / week    PT Duration 8 weeks    PT Treatment/Interventions ADLs/Self Care Home Management;Aquatic Therapy;Cryotherapy;Electrical Stimulation;Moist Heat;Iontophoresis 4mg /ml Dexamethasone;Traction;Gait training;Stair training;Functional mobility training;Therapeutic exercise;Therapeutic activities;Balance  training;Neuromuscular re-education;Patient/family education;Manual techniques;Joint Manipulations;Spinal Manipulations;Taping;Dry needling;Passive range of motion    PT Next Visit Plan follow up on pelvic  floor Pt education from last visit (Johanna), d/c next visit    PT Home Exercise Plan Access Code: [email or web address]           Patient will benefit from skilled therapeutic intervention in order to improve the following deficits and impairments:  Decreased range of motion, Increased muscle spasms, Pain, Decreased activity tolerance, Impaired flexibility, Decreased balance, Decreased strength, Hypomobility, Decreased mobility, Improper body mechanics  Visit Diagnosis: Pain in left hip  Acute bilateral low back pain without sciatica  Muscle weakness (generalized)     Problem List Patient Active Problem List   Diagnosis Date Noted  . SVD (spontaneous vaginal delivery) 07/28/2011   Lavinia Sharps, PT 10/15/19 5:20 PM Phone: [phone] Fax: [phone] Vivien Presto 10/15/2019, 5:20 PM  [facility] [address] [place], [place], [address] Phone: [phone]   Fax:  [phone]  Name: TEKEYAH SANTIAGO MRN: [id number] Date of Birth: Sep 04, 1980

## 2019-10-15 NOTE — Patient Instructions (Signed)
Access Code: [email or web address] URL: [email or web address] Date: 10/15/2019 Prepared by: Lavinia Sharps  Exercises Sidelying Transversus Abdominis Bracing - 1 x daily - 7 x weekly - 1 sets - 10 reps - 10 hold Single Leg Stance with Support - 1 x daily - 7 x weekly - 1 sets - 3 reps - 10 hold Stride Stance Weight Shift - 1 x daily - 7 x weekly - 1 sets - 15 reps - 3 hold Forward Step Up - 1 x daily - 7 x weekly - 10 reps - 3 sets Side Lunge Adductor Stretch - 1 x daily - 7 x weekly - 1 sets - 3 reps - 30 hold Supine Figure 4 Piriformis Stretch - 1 x daily - 7 x weekly - 1 sets - 3 reps - 20 hold Seated Piriformis Stretch - 1 x daily - 7 x weekly - 1 sets - 3 reps - 20 hold Supine Hip Adductor Stretch - 1 x daily - 7 x weekly - 1 sets - 3 reps - 20 hold Standing Isometric Hip Abduction with Ball on Wall - 1 x daily - 7 x weekly - 1 sets - 5 reps - 5 hold Bird Dog - 1 x daily - 7 x weekly - 1 sets - 10 reps Standing Hip Hinge with Dowel - 1 x daily - 7 x weekly - 1 sets - 10 reps Hip Flexor Stretch at Edge of Bed - 1 x daily - 7 x weekly - 1 sets - 3 reps - 20 hold Supine March with Elevation - 1 x daily - 7 x weekly - 1 sets - 10 reps Seated Transversus Abdominis Bracing - 1 x daily - 7 x weekly - 1 sets - 10 reps Standing Low Shoulder Row with Anchored Resistance - 1 x daily - 7 x weekly - 1 sets - 10 reps Standing Shoulder Extension with Resistance - 1 x daily - 7 x weekly - 1 sets - 10 reps Standing Diagonal Shoulder Extension with Anchored Resistance - 1 x daily - 7 x weekly - 1 sets - 10 reps Standing Diagonal Shoulder Extension with Anchored Resistance - 1 x daily - 7 x weekly - 1 sets - 10 reps Clamshell - 1 x daily - 7 x weekly - 1 sets - 10 reps Sidelying Reverse Clamshell - 1 x daily - 7 x weekly - 1 sets - 10 reps Half Kneeling Hip Flexor Stretch with 3-Way Reach - 1 x daily - 7 x weekly - 1 sets - 10 reps Half Kneeling Hip Flexor Stretch with Sidebend - 1 x daily - 7 x weekly  - 1 sets - 10 reps Half-Kneeling Shoulder Horizontal Abduction with Resistance - 1 x daily - 7 x weekly - 3 sets - 10 reps Kneeling Diagonal Chop with Anchored Resistance - 1 x daily - 7 x weekly - 3 sets - 10 reps Supine Alternating Knee Taps with Hands - 1 x daily - 7 x weekly - 1 sets - 10 reps Lateral Step Up - 1 x daily - 7 x weekly - 2 sets - 10 reps Half Dead Lift with Kettlebell - 1 x daily - 7 x weekly - 1 sets - 10 reps

## 2019-10-17 DIAGNOSIS — F3181 Bipolar II disorder: Secondary | ICD-10-CM | POA: Diagnosis not present

## 2019-10-20 ENCOUNTER — Encounter: Payer: Self-pay | Admitting: Physical Therapy

## 2019-10-20 ENCOUNTER — Ambulatory Visit: Payer: BC Managed Care – PPO | Attending: Family Medicine | Admitting: Physical Therapy

## 2019-10-20 ENCOUNTER — Other Ambulatory Visit: Payer: Self-pay

## 2019-10-20 DIAGNOSIS — M545 Low back pain, unspecified: Secondary | ICD-10-CM

## 2019-10-20 DIAGNOSIS — M25552 Pain in left hip: Secondary | ICD-10-CM

## 2019-10-20 DIAGNOSIS — M6281 Muscle weakness (generalized): Secondary | ICD-10-CM

## 2019-10-20 NOTE — Therapy (Signed)
[facility] [address] [place], [place], [address] Phone: [phone]   Fax:  [phone]  Physical Therapy Treatment  Patient Details  Name: Stephanie [name] MRN: [id number] Date of Birth: 23-Apr-1980 Referring Provider (PT): Fanny Bien, MD   Encounter Date: 10/20/2019   PT End of Session - 10/20/19 1607    Visit Number 9    Date for PT Re-Evaluation 10/20/19    Authorization Type BCBS    Authorization - Visit Number 9    Authorization - Number of Visits 30    PT Start Time 9794    PT Stop Time 1608    PT Time Calculation (min) 38 min    Activity Tolerance Patient tolerated treatment well    Behavior During Therapy [facility] for tasks assessed/performed           Past Medical History:  Diagnosis Date  . Acid reflux   . Anxiety   . Arthritis   . Asthma   . Bipolar depression ([place])   . Carpal tunnel syndrome   . Depression   . Hypothyroid   . IBS (irritable bowel syndrome)   . Normal pregnancy, first 07/27/2011  . Sinusitis   . SVD (spontaneous vaginal delivery) 07/28/2011  . SVD (spontaneous vaginal delivery) 07/28/2011    Past Surgical History:  Procedure Laterality Date  . extraction of wisdom teeth      There were no vitals filed for this visit.   Subjective Assessment - 10/20/19 1538    Subjective I did the HEP and felt good about it.  I do have some return of old plantar fascitis and wonder what I need to do b/c it's on the same side as the hip that has hurt.  I have had no leakage and continue to work on my PF.    Pertinent History PT in past has helped same symptoms with pelvic alignment and exercise, history of constipation (meds have given her loose stool), IBS, psoriatic arthritis    Limitations Standing    How long can you stand comfortably? with weight shifting, 30 min    How long can you walk comfortably? symptoms improve with walking    Diagnostic tests xrays at chiro 2021 -  negative, pelvic US 2019 negative, lumbar MRI 2018 Mild degenerative disease L4-5 and L5-S1 without central canal foraminal narrowing    Currently in Pain? Yes    Pain Score 2     Pain Location Hip    Pain Orientation Left;Lateral    Pain Descriptors / Indicators Sore    Pain Type Chronic pain    Pain Onset More than a month ago    Pain Frequency Intermittent    Aggravating Factors  stepping up onto bleacher, my 6yo sitting on my lap    Pain Relieving Factors heat, walking              [facility] PT Assessment - 10/20/19 0001      Assessment   Medical Diagnosis M25.552 (ICD-10-CM) - Pain in left hip    Referring Provider (PT) Fanny Bien, MD    Next MD Visit --   6-8 weeks   Prior Therapy yes, with Earlie Counts years ago                         [facility] Adult PT Treatment/Exercise - 10/20/19 0001      Exercises   Exercises Ankle;Knee/Hip  Knee/Hip Exercises: Standing   Lateral Step Up Right;Left;1 set;15 reps;Step Height: 4";Hand Hold: 0    Forward Step Up Right;Left;1 set;15 reps;Hand Hold: 0;Step Height: 4"      Ankle Exercises: Stretches   Plantar Fascia Stretch 2 reps;30 seconds    Plantar Fascia Stretch Limitations Lt    Soleus Stretch 2 reps;30 seconds    Soleus Stretch Limitations bil    Gastroc Stretch 2 reps;30 seconds    Gastroc Stretch Limitations bil    Slant Board Stretch 2 reps;30 seconds    Other Stretch plantar fascia rolling with firm ball seated and standing x 1' each, Lt                    PT Short Term Goals - 10/15/19 1718      PT SHORT TERM GOAL #1   Title Pt will be ind with initial HEP for gentle hip and trunk mobility, stretching, and lumbar/pelvic/hip stability.    Status Achieved      PT SHORT TERM GOAL #2   Title Pt will have internal pelvic floor assessment by pelvic PT to determine strength and function of pelvic floor muscles and incorporate appropriate exercise into HEP.    Status Achieved      PT  SHORT TERM GOAL #3   Title Pt will achieve P/ROM of Lt hip to at least 110 flexion, 55 ER, 20 IR without pain.    Status Achieved      PT SHORT TERM GOAL #4   Title Pt will demo improved closed chain strength of Lt hip with gait and stairs by demo'ing control over + Trendelenburg.    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    Status Achieved      PT LONG TERM GOAL #2   Title Pt will demo full trunk and Lt hip mobility for functional tasks such as squatting, bending and rising from floor with pain not to exceed 2/10.    Status Achieved      PT LONG TERM GOAL #3   Title Pt will achieve Lt hip and core strength of at least 4+/5 for improved work task performance.    Status Achieved      PT LONG TERM GOAL #4   Title Pt will be able to tolerate static standing tasks for at least 30 min with pain not to exceed 2/10.    Status Achieved      PT LONG TERM GOAL #5   Title Pt will report resolution of urine and fecal leakage and understand how to recruit and train strength of her PF as part of her HEP.    Status Achieved      PT LONG TERM GOAL #6   Title Pt will report confidence in use of The Knack with pelvic floor lift vs bulge in anticipation of cough, laugh, sneeze.    Status Achieved      PT LONG TERM GOAL #7   Title Pt will report reduced incidence in leakage with aggravating factors by at least 80%    Status Achieved                 Plan - 10/20/19 1608  Clinical Impression Statement Pt has met all goals.  She is successfully managing continence with PF HEP.  She has learned toileting techniques to improve defecation strategies.  Pelvic alignment is maintained and strength is improving and will continue to improve with HEP compliance.  Pt had some calf flexibiity restrictions and  arch pain so PT reviewed options to manage this today.  PT reviewed step ups forward/lateral for HEP with minor cueing needed for technique.  Pt is ready to d/c to HEP.    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    PT Frequency 2x / week    PT Duration 8 weeks    PT Treatment/Interventions ADLs/Self Care Home Management;Aquatic Therapy;Cryotherapy;Electrical Stimulation;Moist Heat;Iontophoresis 6m/ml Dexamethasone;Traction;Gait training;Stair training;Functional mobility training;Therapeutic exercise;Therapeutic activities;Balance training;Neuromuscular re-education;Patient/family education;Manual techniques;Joint Manipulations;Spinal Manipulations;Taping;Dry needling;Passive range of motion    PT Next Visit Plan d/c to HEP w/ f/u as needed    PT Home Exercise Plan Access Code: [email or web address]    Consulted and Agree with Plan of Care Patient           Patient will benefit from skilled therapeutic intervention in order to improve the following deficits and impairments:     Visit Diagnosis: Pain in left hip  Acute bilateral low back pain without sciatica  Muscle weakness (generalized)     Problem List Patient Active Problem List   Diagnosis Date Noted  . SVD (spontaneous vaginal delivery) 07/28/2011    PHYSICAL THERAPY DISCHARGE SUMMARY  Visits from Start of Care: 9  Current functional level related to goals / functional outcomes: See above   Remaining deficits: Met all goals, ind with HEP   Education / Equipment: HEP  Plan: Patient agrees to discharge.  Patient goals were met. Patient is being discharged due to meeting the stated rehab goals.  ?????        JBaruch Merl PT 10/20/19 4:12 PM   [facility] [address] [place]  [address]Phone: [phone]  Fax:  [phone] Name: Stephanie BOLLIGMRN: [id number] of Birth: [birth year]

## 2019-10-21 DIAGNOSIS — H35052 Retinal neovascularization, unspecified, left eye: Secondary | ICD-10-CM | POA: Diagnosis not present

## 2019-10-26 DIAGNOSIS — F3176 Bipolar disorder, in full remission, most recent episode depressed: Secondary | ICD-10-CM | POA: Diagnosis not present

## 2019-10-26 DIAGNOSIS — F3174 Bipolar disorder, in full remission, most recent episode manic: Secondary | ICD-10-CM | POA: Diagnosis not present

## 2019-11-02 DIAGNOSIS — Z20822 Contact with and (suspected) exposure to covid-19: Secondary | ICD-10-CM | POA: Diagnosis not present

## 2019-11-02 DIAGNOSIS — J069 Acute upper respiratory infection, unspecified: Secondary | ICD-10-CM | POA: Diagnosis not present

## 2019-11-02 DIAGNOSIS — R05 Cough: Secondary | ICD-10-CM | POA: Diagnosis not present

## 2019-11-06 DIAGNOSIS — M79672 Pain in left foot: Secondary | ICD-10-CM | POA: Diagnosis not present

## 2019-11-06 DIAGNOSIS — M9906 Segmental and somatic dysfunction of lower extremity: Secondary | ICD-10-CM | POA: Diagnosis not present

## 2019-11-06 DIAGNOSIS — M7918 Myalgia, other site: Secondary | ICD-10-CM | POA: Diagnosis not present

## 2019-11-06 DIAGNOSIS — M722 Plantar fascial fibromatosis: Secondary | ICD-10-CM | POA: Diagnosis not present

## 2020-08-18 ENCOUNTER — Other Ambulatory Visit: Payer: Self-pay | Admitting: Family Medicine

## 2020-08-18 DIAGNOSIS — Z1231 Encounter for screening mammogram for malignant neoplasm of breast: Secondary | ICD-10-CM

## 2020-08-22 ENCOUNTER — Other Ambulatory Visit: Payer: Self-pay

## 2020-08-22 ENCOUNTER — Ambulatory Visit
Admission: RE | Admit: 2020-08-22 | Discharge: 2020-08-22 | Disposition: A | Discharge status: Home / Self Care | Payer: BC Managed Care – PPO | Source: Ambulatory Visit | Attending: Family Medicine | Admitting: Family Medicine

## 2020-08-22 DIAGNOSIS — Z1231 Encounter for screening mammogram for malignant neoplasm of breast: Secondary | ICD-10-CM

## 2020-11-17 ENCOUNTER — Other Ambulatory Visit: Payer: Self-pay

## 2020-11-17 ENCOUNTER — Ambulatory Visit (INDEPENDENT_AMBULATORY_CARE_PROVIDER_SITE_OTHER): Payer: 59 | Admitting: Family Medicine

## 2020-11-17 DIAGNOSIS — Z713 Dietary counseling and surveillance: Secondary | ICD-10-CM | POA: Diagnosis not present

## 2020-11-17 NOTE — Progress Notes (Signed)
Medical Nutrition Therapy Patient has completed 2nd dose of COVID-19 vaccine. PCP Stephanie Cipro, MD Therapists Stephanie [name],  [name] Stephanie May, MD Appt start time: 7353 end time: 1630 (1 hour) Primary concerns today: Referred by Stephanie Cipro, MD for MNT related to preDM (A1c 5.7 on 09/05/20), obesity, HLD, and eating D/O.  Diagnoses also include bipolar, hypothyroid, and ex-induced asthma.   Relevant history/background: At age 62, in response to a chaotic home and her parents' divorce, Stephanie [name] started binge-eating sugar.  By 3rd grade, she started restricting, which worsened at puberty.  At age 13, anorexia was extreme, and she lost 25 lb in a month.  She started medication for bipolar D/O before age 74, and then started binge-eating sugary foods again and gained a lot of weight.  She lost to 150s in early 17s, before getting pregnant with her daughter.  Lost pregnancy weight using [name] again after the birth of her daughter, and went through a manic phase  where weight loss was maintained.  She started seeing [name] ([facility]phan[name]) for body dysmorphia in Sept 2021, after which she reintroduced sugar to her diet.  With a family hx of diabetes, she had avoided sugar for the previous 20 years, but last fall's sugar "allowance" led to frequent intake of sugary foods and weight gain.  Stephanie [name] wants to be able to maintain moderation with respect to diet, while also reducing her DM risk and stabilizing weight and appetite.  Prior to getting her own therapy re. body image, Stephanie [name]'s 9-YO daughter started seeing a therapist for concerns about disordered eating, but is doing pretty well right now.  Stephanie [name]'s mom has her own disordered eating, and poor appetite control.   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  Currently tracking food intake with [employer] app.  She lives with her 9-YO daughter and husband.    Learning Readiness: Change  in process; has moderated sugar intake in recent months.  Using some some products with low-kcal sweeteners and measures portions.  Does allow bread and baked items at home to teach moderation to her daughter, but limits sweet bakery items available.    Usual eating pattern: 3 meals and 1-2 snacks per day. Frequent foods and beverages: water; taco salad (grnd [place], tortilla chips, let), chx, mac&chs, eggs, biscuits, Kodiak pancakes with walnuts,blueberries,&agave, [place] lasagne, salmon, salads.   Avoided foods: tomatoes (rash in mouth), bacon (exacerbates IBS), high-fat meats, fried foods, regular milk and yogurt (lactose-intolerant, but uses Lactaid milk).   Usual physical activity: none currently; occasionally walks in the park or does Nintendo Just Dance. Sleep: Estimates she gets at least 8 hrs/night.  To bed ~9 or 9:30 PM and gets up at 6:30 AM.    24-hr recall: (Up at 6:30 AM; meds with a lot of water) B (9 AM)-   1 Quest protein cookie (10 g protein, 90 kcal), water Snk ( AM)-    L (11:30 PM)-  1 [place] sandwich, 1 tbsp low-fat mayo, 5 fresh figs, 5 mini cookies, water Snk ( PM)-   D (6:30 PM)-  Taco salad: 3 oz grnd [place], 10 tortilla chips, <1 oz cheese, lettuce, 2 tbsp drsng, 1/2 c Halo Top keto ice crm, water Snk ( PM)-  --- Typical day? Yes.    Except usually eats breakfast before school; didn't feel great yesterday AM.    Nutritional Diagnosis: NB-2.1 Physical inactivity as related to energy intake as evidenced by no regular physical  activity.  Handouts given during visit include: After-Visit Summary (AVS) Meal Planning form Goals Sheet  Demonstrated degree of understanding via:  Teach Back   Monitoring/Evaluation:  Dietary intake, exercise, and body weight in 6 week(s).

## 2020-11-17 NOTE — Patient Instructions (Addendum)
-   Complete the Meal Planning form provided today.    Goals:  1. Eat at least 3 REAL meals and 1-2 snacks per day.  Eat breakfast within one hour of getting up.  Aim for no more than 5 hours between eating.   A REAL meal includes at least some protein, vegetables and/or fruit, and some starch is optional.  Assess: Will my meal be truly satisfying without the starch food?   (OR: Would you serve this to a guest in your home, and call it a meal?)  2. Include vegetables in at least 10 meals per week.    3. Do some physical activity at least 60 minutes wk.    Document progress on above goals using the Goals Sheet provided today.   - In addition, track the number of sweets you have during the week, with no set goal or restriction.  This is for your awareness.    Follow-up in-office appt on Monday, Oct 10 at 2:30 PM.

## 2020-12-26 ENCOUNTER — Ambulatory Visit: Payer: 59 | Admitting: Family Medicine

## 2021-01-19 ENCOUNTER — Ambulatory Visit: Payer: 59 | Admitting: Family Medicine

## 2021-01-23 ENCOUNTER — Other Ambulatory Visit: Payer: Self-pay

## 2021-01-23 ENCOUNTER — Ambulatory Visit (INDEPENDENT_AMBULATORY_CARE_PROVIDER_SITE_OTHER): Payer: 59 | Admitting: Family Medicine

## 2021-01-23 DIAGNOSIS — Z713 Dietary counseling and surveillance: Secondary | ICD-10-CM

## 2021-01-23 NOTE — Progress Notes (Signed)
Medical Nutrition Therapy Patient has completed 2nd dose of COVID-19 vaccine. PCP [name], MD Therapists Abel Presto,  [facility] Psychiatrist Milagros Evener, MD Appt start time: 1530 end time: 1630 (1 hour) Primary concerns today: Referred by [name], MD for MNT related to preDM (A1c 5.7 on 09/05/20), obesity, HLD, and eating D/O.  Diagnoses also include bipolar, hypothyroid, and ex-induced asthma.   Relevant history/background: At age 18, in response to a chaotic home and her parents' divorce, Stephanie [name] started binge-eating sugar.  By 3rd grade, she started restricting, which worsened at puberty.  At age 15, anorexia was extreme, and she lost 25 lb in a month.  She started medication for bipolar D/O before age 39, and then started binge-eating sugary foods again and gained a lot of weight.  She lost to 150s in early 30s, before getting pregnant with her daughter.  Lost pregnancy weight using [employer] again after the birth of her daughter, and went through a manic phase where weight loss was maintained.  She started seeing [facility] for body dysmorphia in Sept 2021, after which she reintroduced sugar to her diet.  With a family hx of diabetes, she had avoided sugar for the previous 20 years, but last fall's sugar "allowance" led to frequent intake of sugary foods and weight gain.  Stephanie [name] wants to be able to maintain moderation with respect to diet, while also reducing her DM risk and stabilizing weight and appetite.  Prior to getting her own therapy re. body image, Stephanie [name]'s 9-YO daughter started seeing a therapist for concerns about disordered eating, but is doing pretty well right now.  Stephanie [name]'s mom has her own disordered eating, and poor appetite control.  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  Currently tracking food intake with [employer] app.  She lives with her 9-YO daughter and husband.   Assessment:   Stephanie [name] tracked progress on her  goals until she got sick in late Sept.  Was doing well in meeting 3 meals/day and veg goals, but has seldom exercised.  She has been making a meal plan for the week, which she writes on a white board in the kitchen, and she is prepping the weekday lunches on weekends.  Intake of sweets has increased as stress has gone up (report card season).  An area in which she'd especially like to improve is food at work, where sweets and snacks are often available.   Usual eating pattern: 3 meals and 1-2 snacks per day. Avoided foods: tomatoes (rash in mouth), bacon (exacerbates IBS), high-fat meats, fried foods, regular milk and yogurt (lactose-intolerant, but uses Lactaid milk).   Usual physical activity: Not much. Walked a mile a couple of times.  No Nintendo Just Dance yet.  Sleep: Estimates she gets at least 8 hrs/night.    24-hr recall:  (Up at 7:30 AM) B (8:30 AM)-  1 1/2 c Corn Chex, 3/4 c Lactaid f-f milk  - Church was 9 to 10:30 -  Snk (11 AM)-   L ( PM)-  15 tortilla chips,3 slc Colby Jack cheese, water Snk (2 PM)-  2/3 c non-dairy ice cream, water Snk (3 PM)-  1 mini Snickers D (5:30 PM)-  2 flour tortillas, 2 oz chx, lettuce, 1 tbsp ranch drsng, water Snk ( PM)-  --- Typical day? Yes.  Except breakfast has usually been cooked oatmeal.     Nutritional Diagnosis: NB-2.1 Physical inactivity as related to energy intake as evidenced by no  regular physical activity.  Handouts given during visit include: After-Visit Summary (AVS) Food Decisions Algorithm Goals Sheet  Demonstrated degree of understanding via:  Teach Back   Monitoring/Evaluation:  Dietary intake, exercise, and body weight in 5 week(s).

## 2021-01-23 NOTE — Patient Instructions (Addendum)
Take the quiz at [email or web address] to determine your "tendency."  This represents your tendency to meet expectations that you have of yourself and that you perceive others have of you.    Give some thought to all the points of "temptation" you face during a typical week. "Temptation" = whatever throws you off from achieving intended behavior or leads you to a regretted behavior.  Make a list of these temptations, and potential solutions to these temptations.  This list should include both food- and physical activity-related.    The next time you are struggling with a food decision, use the Food Decisions Algorithm.  Write about this process if you can.    [employer] list of alternative activities to eating: Re-work this to be specific to you, and write it out long-hand.    Goals remain the same:  1. Eat at least 3 REAL meals and 1-2 snacks per day.  Eat breakfast within one hour of getting up.  Aim for no more than 5 hours between eating.   A REAL meal includes at least some protein, vegetables and/or fruit, and some starch is optional.  Assess: Will my meal be truly satisfying without the starch food?   (OR: Would you serve this to a guest in your home, and call it a meal?) 2. Include vegetables in at least 10 meals per week.   3. Do some physical activity at least 60 minutes wk.     Document progress on above goals using the Goals Sheet provided today.   - In addition, track the number of sweets you have during the week, with no set goal or restriction.  This is for your awareness.     Follow-up in-office appt on Tuesday, December 13 at 4:30 PM.

## 2021-02-28 ENCOUNTER — Ambulatory Visit: Payer: 59 | Admitting: Family Medicine

## 2021-04-17 ENCOUNTER — Other Ambulatory Visit: Payer: Self-pay

## 2021-04-17 ENCOUNTER — Ambulatory Visit (INDEPENDENT_AMBULATORY_CARE_PROVIDER_SITE_OTHER): Payer: 59 | Admitting: Family Medicine

## 2021-04-17 DIAGNOSIS — Z713 Dietary counseling and surveillance: Secondary | ICD-10-CM

## 2021-04-17 NOTE — Progress Notes (Signed)
Medical Nutrition Therapy Patient has completed 2nd dose of COVID-19 vaccine. PCP [name], MD Therapists Abel Presto,  [facility] Psychiatrist Milagros Evener, MD Appt start time: 1600 end time: 1700 (1 hour) Primary concerns today: Referred by [name], MD for MNT related to preDM (A1c 5.7 on 09/05/20), obesity, HLD, and eating D/O.  Diagnoses also include bipolar, hypothyroid, and ex-induced asthma.   Relevant history/background: At age 41, in response to a chaotic home and her parents' divorce, Stephanie [name] started binge-eating sugar.  By 3rd grade, she started restricting, which worsened at puberty.  At age 7, anorexia was extreme, and she lost 25 lb in a month.  She started medication for bipolar D/O before age 41, and then started binge-eating sugary foods again and gained a lot of weight.  She lost to 150s in early 30s, before getting pregnant with her daughter.  Lost pregnancy weight using [employer] again after the birth of her daughter, and went through a manic phase where weight loss was maintained.  She started seeing [facility] ([facility]) for body dysmorphia in Sept 2021, after which she reintroduced sugar to her diet.  With a family hx of diabetes, she had avoided sugar for the previous 20 years, but last fall's sugar "allowance" led to frequent intake of sugary foods and weight gain.  Stephanie [name] wants to be able to maintain moderation with respect to diet, while also reducing her DM risk and stabilizing weight and appetite.  Prior to getting her own therapy re. body image, Stephanie [name]'s 9-YO daughter started seeing a therapist for concerns about disordered eating, but is doing pretty well right now.  Stephanie [name]'s mom has her own disordered eating, and poor appetite control.  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  Currently tracking food intake with [employer] app.  She lives with her 9-YO daughter and husband.   Assessment:   Since last seen, Stephanie [name] has  had COVID, bronchitis, and pneumonia.  In addition, her dad died in early 2022-05-01.  She feels like her health is nearly back to normal by now.  She started metformin in mid-December; insurance would not pay for [facility].  Yazmyne sounded ready to recommit to meeting behavioral goals, including at least limited exercise.   Weight 207.2 lb, up from 200.4 lb on 11-17-20.  (Ht is 63".) Usual eating pattern:  Most days getting 3 meals and 2 snacks.  Vegetable intake:  Usually having salad with both lunch and dinner.  Usual physical activity:  Has not been able to exercise due to illness.  Still trying to figure out when she can work ex into her schedule.  Recently joined [employer], which is on her way home from school, and hopes to ease back into exercise, concerned with h/o exercise-induced asthma and recent respiratory problems.   - Has restarted weekly meal planning on white board in kitchen this past week.   - Again prepping weekday lunches on the weekend now that she is back to work.   - Intake of sweets has been limited; having 2 GS cookies in lunch, and has not eaten them at home.   - There have been fewer sweets and snacks at work since the holidays are past.   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   - She has used the Food Decisions Algorithm at times, which she has found helpful.  No food recall today.    Nutritional Diagnosis: No progress on NB-2.1 Physical inactivity as related to energy intake as evidenced by no regular physical activity yet incorporated to schedule.    Handouts given during visit include: After-Visit Summary (AVS) Revised Goals Sheet  Monitoring/Evaluation:  Dietary intake, exercise, and body weight in 6 week(s).

## 2021-04-17 NOTE — Patient Instructions (Signed)
Goals remain the same:  1. Eat at least 3 REAL meals and 1-2 snacks per day.  Eat breakfast within one hour of getting up.  Aim for no more than 5 hours between eating.   A REAL meal includes at least some protein, vegetables and/or fruit, and some starch is optional.  Assess: Will my meal be truly satisfying without the starch food?   (OR: Would you serve this to a guest in your home, and call it a meal?) 2. Include vegetables in at least 10 meals per week.   3. Do some physical activity at least 45 minutes wk.    In addition, make a list of temptations often faced during week, along with potential solutions.  Email your list to [facility] no later than Thursday.    Document progress on above goals using the Goals Sheet provided today.   - In addition, track the number of sweets you have during the week, with no set goal or restriction.  This is for your awareness.   - AND: Incorporate to your prayer journal a review of yesterday, and how you feel it went with respect to achieving balance between needs of yourself and others.   Follow-up in-office appt on Monday, March 13 at 4 PM.

## 2021-05-01 ENCOUNTER — Ambulatory Visit: Payer: 59 | Admitting: Family Medicine

## 2021-05-29 ENCOUNTER — Ambulatory Visit: Payer: 59 | Admitting: Family Medicine

## 2021-07-06 ENCOUNTER — Encounter: Payer: Self-pay | Admitting: Family Medicine

## 2021-07-06 ENCOUNTER — Ambulatory Visit (INDEPENDENT_AMBULATORY_CARE_PROVIDER_SITE_OTHER): Payer: Managed Care, Other (non HMO) | Admitting: Family Medicine

## 2021-07-06 DIAGNOSIS — Z713 Dietary counseling and surveillance: Secondary | ICD-10-CM | POA: Diagnosis not present

## 2021-07-06 NOTE — Progress Notes (Signed)
Medical Nutrition Therapy ?Patient has completed 2nd dose of COVID-19 vaccine. ?PCP [name], MD ?Therapists Abel Presto,  [facility] ?Psychiatrist Milagros Evener, MD ?Appt start time: 1600 end time: 1700 (1 hour) ?Primary concerns today: Referred by [name], MD for MNT related to preDM (A1c 5.7 on 09/05/20), obesity, HLD, and eating D/O.  Diagnoses also include bipolar, hypothyroid, and ex-induced asthma.  ? ?Relevant history/background: At age 22, in response to a chaotic home and her parents' divorce, Stephanie [name] started binge-eating sugar.  By 3rd grade, she started restricting, which worsened at puberty.  At age 55, anorexia was extreme, and she lost 25 lb in a month.  She started medication for bipolar D/O before age 72, and then started binge-eating sugary foods again and gained a lot of weight.  She lost to 150s in early 30s, before getting pregnant with her daughter.  Lost pregnancy weight using [employer] again after the birth of her daughter, and went through a manic phase where weight loss was maintained.  She started seeing [facility] ([facility]) for body dysmorphia in Sept 2021, after which she reintroduced sugar to her diet.  With a family hx of diabetes, she had avoided sugar for the previous 20 years, but last fall's sugar "allowance" led to frequent intake of sugary foods and weight gain.  Stephanie [name] wants to be able to maintain moderation with respect to diet, while also reducing her DM risk and stabilizing weight and appetite.  Prior to getting her own therapy re. body image, Stephanie [name]'s 9-YO daughter started seeing a therapist for concerns about disordered eating, but is doing pretty well right now.  Cleveland's mom has her own disordered eating, and poor appetite control.  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  Currently tracking food intake with [employer] app.  She lives with her 9-YO daughter and husband.  ? ?Assessment:   Stephanie [name] has recovered from  respiratory illnesses of the past few months.  Still taking metformin started in mid-December (insurance would not pay for [facility]).  She had been getting to the gym 2 X wk until spring break, when she went out of town with her family.  She is again counting [employer] points, tracking intake and activity with the app.  Tamsin talked about an eating incident this week in which she ate some calorie-dense foods on a day she had to stay home with her sick daughter.  She recognized feeling somewhat rebellious in this.  When I asked her how else she felt about this, she said she suspects her feelings of rebellion stem from feeling judged by her husband, who seems to never struggle with determining health behavior goals and meeting them and who frequently makes disparaging remarks about larger-sized people.  Although she has discussed this with her therapist, she has not figured out how to broach this subject with him.   ? ?Weight: 209.0 lb. (207.2 lb on 04/17/21). (Ht is 63".) ?Usual eating pattern:  Most days getting 3 meals and 2 snacks.  ?Vegetable intake:  Having salad with both lunch and dinner.  ?Usual physical activity:  Member at [employer], but has not been going past couple weeks.   ?- Still doing weekly meal planning on white board in kitchen this past week.   ?- Still prepping weekday lunches on the weekend now that she is back to work.   ?- Intake of sweets 1-2 X day (4 X one day; still not sure why). ?- Journaling:  Has been consistent, but not including thoughts re. food choices ?- Frequent temptations: Sweets continue to be especially appealing.  Sweets are passed around during mtgs at work, but Lorann has started limiting herself to 2 only, and keeps the wrappers in front of her during the mtg.    ? ?24-hr recall:  ?(Up at 7 AM) ?B (7:15 AM)-  1 c dry oatmeal, cooked in water, 1 c Lactaid f-f milk, 9 raisins, water ?Snk (9:30)-  5 oz Lite&Fit Grk yogurt  ?L ( PM)-  3 oz shrimp on salad, 2  tbsp lite drsng, [employer] Cinnamon puffs (70 kcal), 1 Lindor choc truffle   ?Snk (4 PM)-  Little Debbie's Baby Bundt cake (140 kcal) ?D (6 PM)-  4 Kodiak pancakes with blueberries, walnuts, 1-2 tbsp agave syrup, water ?Snk ( PM)-  --- ?Typical day? Yes.    ? ?Nutritional Diagnosis: No progress on NB-2.1 Physical inactivity as related to energy intake as evidenced by no regular physical activity yet incorporated to schedule.   ? ?Handouts given during visit include: ?After-Visit Summary (AVS) ? ?Monitoring/Evaluation:  Dietary intake, exercise, and body weight in 9 week(s).   ?  ?

## 2021-07-06 NOTE — Patient Instructions (Signed)
-   Incorporate to your prayer journal a review of yesterday, and how you feel it went with respect to achieving balance between needs of yourself and others.  ? ?Review previously observed temptations and potential solutions.  Ask yourself if you are aware of additional temptations, and how you have been handling any of them, as well as do you have other ideas for managing? ? ?Book recommendation:  The Four Tendencies.  It may be helpful for both you and your husband to understand each others' tendency.   ? ?Meal planning: You may find that having at least 3 separate components makes a meal more satisfying.   ? ?Goals remain the same:  ?1. Eat at least 3 REAL meals and 1-2 snacks per day.  Eat breakfast within one hour of getting up.  Aim for no more than 5 hours between eating.   ?A REAL meal includes at least some protein, vegetables and/or fruit, and some starch is optional.  Assess: Will my meal be truly satisfying without the starch food?   ?(OR: Would you serve this to a guest in your home, and call it a meal?) ?2. Include vegetables in at least 10 meals per week.   ?3. Do some physical activity at least 45 minutes wk.   ?    - Put workout clothes in car the night before work.   ?    - Schedule workout time on the calendar.   ? ?There is no goal set around sweets, but just record on your Goals Sheet when you have a serving of any sweet.   ?  ?Document progress on above goals using the Goals Sheet provided today.   ?  ?Follow-up in-office appt on Thursday, June 29 at 4 PM.  ?  ?

## 2021-07-21 ENCOUNTER — Other Ambulatory Visit: Payer: Self-pay | Admitting: Family Medicine

## 2021-07-21 DIAGNOSIS — Z1231 Encounter for screening mammogram for malignant neoplasm of breast: Secondary | ICD-10-CM

## 2021-08-14 IMAGING — CR DG HIP (WITH OR WITHOUT PELVIS) 2-3V*L*
3 series · 3 of 3 positions shown · non-contrast
Comparison: None.

CLINICAL DATA: Chronic left hip pain.

EXAM:
DG HIP (WITH OR WITHOUT PELVIS) 2-3V LEFT

[w pelvis upright]
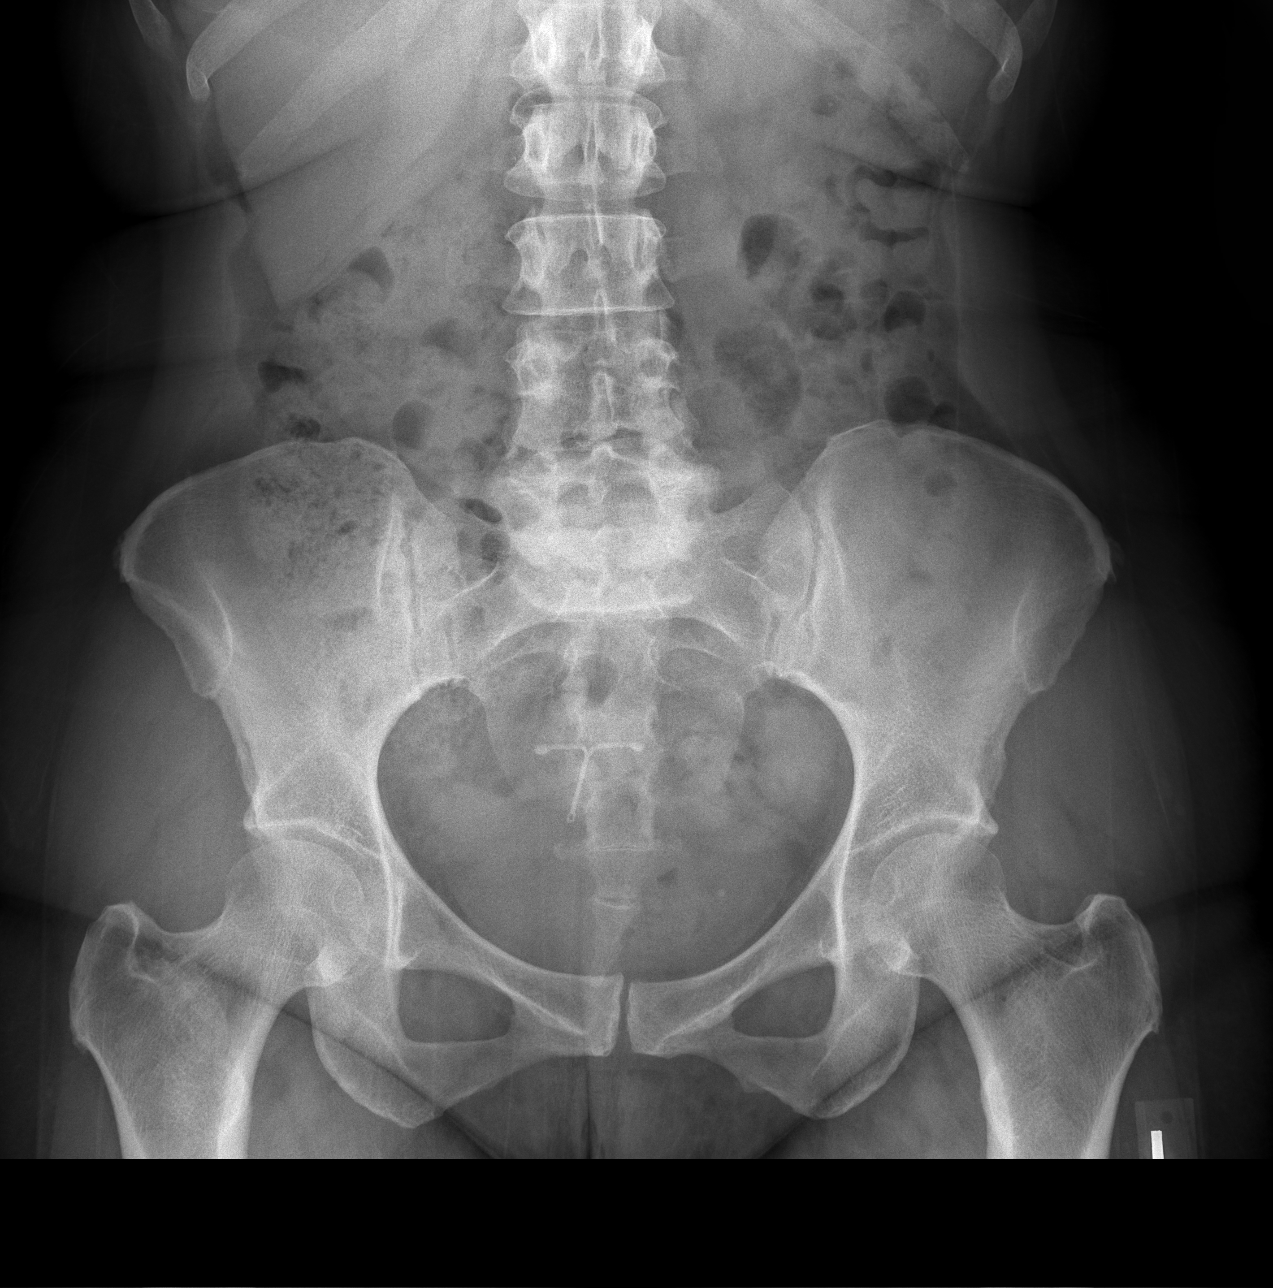

[w hip ap left]
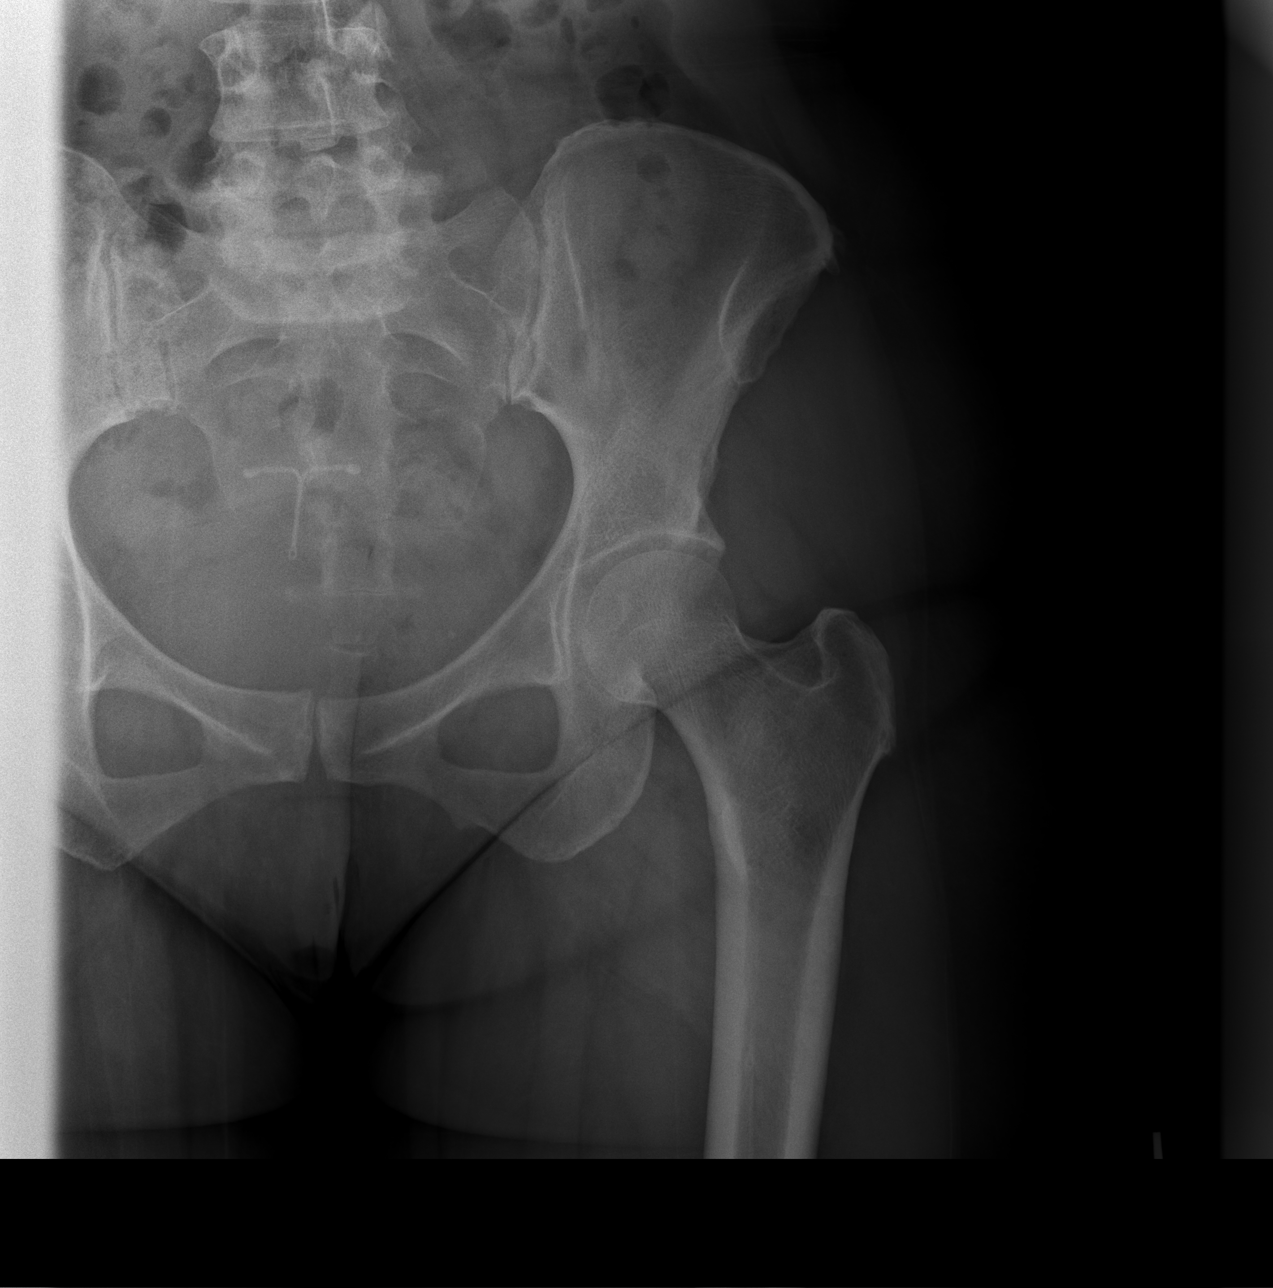

[w hip lat left]
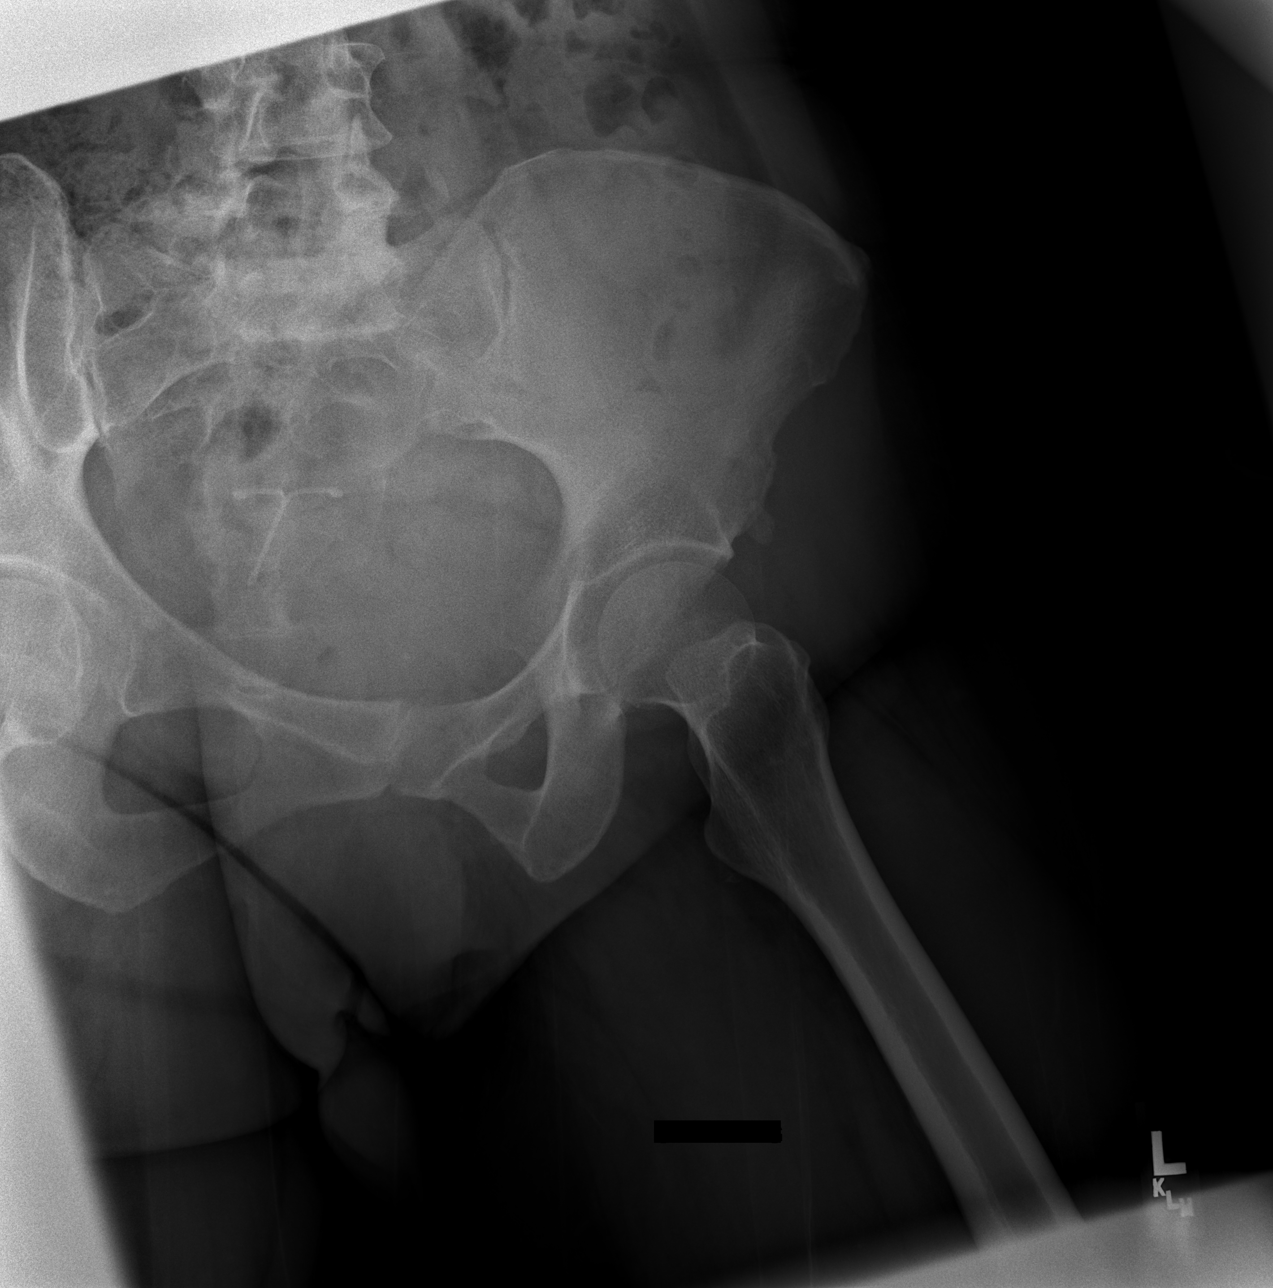

[3 of 3 positions shown; findings below may reference images not displayed]

FINDINGS: There is no evidence of hip fracture or dislocation. There is no
evidence of arthropathy or other focal bone abnormality.
IMPRESSION: Negative.

## 2021-09-12 ENCOUNTER — Ambulatory Visit
Admission: RE | Admit: 2021-09-12 | Discharge: 2021-09-12 | Disposition: A | Discharge status: Home / Self Care | Payer: Managed Care, Other (non HMO) | Source: Ambulatory Visit | Attending: Family Medicine | Admitting: Family Medicine

## 2021-09-12 DIAGNOSIS — Z1231 Encounter for screening mammogram for malignant neoplasm of breast: Secondary | ICD-10-CM

## 2021-09-14 ENCOUNTER — Ambulatory Visit: Payer: Managed Care, Other (non HMO) | Admitting: Family Medicine

## 2021-09-26 ENCOUNTER — Ambulatory Visit (INDEPENDENT_AMBULATORY_CARE_PROVIDER_SITE_OTHER): Payer: Managed Care, Other (non HMO) | Admitting: Family Medicine

## 2021-09-26 DIAGNOSIS — Z713 Dietary counseling and surveillance: Secondary | ICD-10-CM | POA: Diagnosis not present

## 2021-09-26 NOTE — Patient Instructions (Addendum)
-   Incorporate to your prayer journal a review of yesterday, and how you feel it went with respect to achieving balance between needs of yourself and others.  And what can you do to help ensure you meet your own needs?  Emphasize whole, real foods over highly processed foods as often as possible.     Goals remain the same:  1. Include vegetables in at least 10 meals per week.   2. Do some physical activity at least 60 minutes week, including at least one workout at the gym per week.   Document progress on the above goals using the Goals Sheet.   Document progress on above goals using the Goals Sheet provided today.     Follow-up in-office appt on Thursday, Oct 5 at 4 PM.

## 2021-09-26 NOTE — Progress Notes (Signed)
Medical Nutrition Therapy PCP [name], MD Therapists [facility] Presto Psychiatrist Milagros Evener, MD Appt start time: 1600 end time: 1700 (1 hour) Primary concerns today: Referred by [name], MD for MNT related to preDM (A1c 5.7 on 09/05/20), obesity, HLD, and eating D/O.  Diagnoses also include bipolar, hypothyroid, and ex-induced asthma.   Relevant history/background: At age 41, in response to a chaotic home and Stephanie parents' divorce, Stephanie [name] started binge-eating sugar.  By 3rd grade, she started restricting, which worsened at puberty.  At age 58, anorexia was extreme, and she lost 25 lb in a month.  She started medication for bipolar D/O before age 20, and then started binge-eating sugary foods again and gained a lot of weight.  She lost to 150s in early 30s, before getting pregnant with Stephanie [name].  Lost pregnancy weight using [employer] again after the birth of Stephanie [name], and went through a manic phase where weight loss was maintained.  She started seeing [facility] ([facility]) for body dysmorphia in Sept 2021, after which she reintroduced sugar to Stephanie diet.  With a family hx of diabetes, she had avoided sugar for the previous 20 years, but last fall's sugar "allowance" led to frequent intake of sugary foods and weight gain.  Stephanie [name] wants to be able to maintain moderation with respect to diet, while also reducing Stephanie DM risk and stabilizing weight and appetite.  Prior to getting Stephanie own therapy re. body image, Stephanie [name] started seeing a therapist for concerns about disordered eating, but is doing pretty well right now.  Stephanie [name]'s mom has Stephanie own disordered eating, and poor appetite control.  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  Currently tracking food intake with [employer] app.  She lives with Stephanie [name] and husband.   Assessment:   Stephanie [name] has discontinued Metformin b/c of intolerance.  She is not a candidate for [employer], and  insurance denied coverage of Mounjaro, so she is currently committed to making more careful food choices, and has lost ~12 lb since June 10.  (Again using [employer] points system.)  She asked Stephanie husband to read [employer] book, "How to Get Your Child to Eat, But Not Too Much,"  which has helped him to understand Melesa's perspective about food and eating behaviors, especially as related to their [name]'s nutrition needs.  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    Weight: 201.6 lb. (209.0 lb on 07/06/21). (Ht is 63".) Usual eating pattern:  Most days getting 3 meals and 2 snacks.  Vegetable intake:  Getting a large salad at each dinner; occasionally veg's at lunch.  Usual physical activity:  Mostly household activities; 30-60 min hiking or walking 2 X wk.  (Member at [employer]).   - Consistently doing weekly meal planning on white board in kitchen this past week.   - Inconsistently prepping weekday lunches.   - Intake of sweets usually having 0-1 per day. - Journaling:  Still doing prayer journal, but not specifically related to self needs. - Frequent temptations:  Was able to turn down sweets offered at school a couple of times.    24-hr recall:  (Up at 5:30 AM) B (6:45 AM)-  1 1/3 c Cheerios, 1 c f-f lactaid milk, water Snk ( AM)-  --- L (11:30 PM)-  5 oz Light&Fit Grk yogurt (90 kcal), Kind bar, Lunchable (ham,chs,crackers;  13 g pro, 260 kcal), water Snk ( PM)-  water D (5 PM)-  2 large Kodiak hi-pro blueberry pancakes, 1 1/2 c sweet kale salad, water Snk (8 PM)-  1/4 bagel, water Typical day? Yes.   More typical dinner includes meat.    Nutritional Diagnosis: Noted progress on NB-2.1 Physical inactivity as related to energy intake as evidenced by walking/hiking a couple times a week.    Handouts given during visit include: After-Visit Summary (AVS) Goals  Sheet  Monitoring/Evaluation:  Dietary intake, exercise, and body weight in 10 week(s).  Will check with insurance to confirm ongoing coverage.

## 2021-12-13 ENCOUNTER — Encounter (HOSPITAL_COMMUNITY): Payer: Self-pay | Admitting: Emergency Medicine

## 2021-12-13 ENCOUNTER — Emergency Department (HOSPITAL_COMMUNITY)
Admission: EM | Admit: 2021-12-13 | Discharge: 2021-12-13 | Disposition: A | Discharge status: Home / Self Care | Payer: Managed Care, Other (non HMO) | Attending: Emergency Medicine | Admitting: Emergency Medicine

## 2021-12-13 ENCOUNTER — Emergency Department (HOSPITAL_COMMUNITY): Payer: Managed Care, Other (non HMO)

## 2021-12-13 DIAGNOSIS — Z79899 Other long term (current) drug therapy: Secondary | ICD-10-CM | POA: Insufficient documentation

## 2021-12-13 DIAGNOSIS — M545 Low back pain, unspecified: Secondary | ICD-10-CM | POA: Insufficient documentation

## 2021-12-13 LAB — BASIC METABOLIC PANEL
Anion gap: 9 (ref 5–15)
BUN: 10 mg/dL (ref 6–20)
CO2: 28 mmol/L (ref 22–32)
Calcium: 9.6 mg/dL (ref 8.9–10.3)
Chloride: 102 mmol/L (ref 98–111)
Creatinine, Ser: 0.73 mg/dL (ref 0.44–1.00)
GFR, Estimated: >60 mL/min (ref >60)
Glucose, Bld: 117 mg/dL — ABNORMAL HIGH (ref 70–99)
Potassium: 4.4 mmol/L (ref 3.5–5.1)
Sodium: 139 mmol/L (ref 135–145)

## 2021-12-13 LAB — CBC WITH DIFFERENTIAL/PLATELET
Abs Immature Granulocytes: 0.04 10*3/uL (ref 0.00–0.07)
Basophils Absolute: 0.1 10*3/uL (ref 0.0–0.1)
Basophils Relative: 1 %
Eosinophils Absolute: 0 10*3/uL (ref 0.0–0.5)
Eosinophils Relative: 0 %
HCT: 43 % (ref 36.0–46.0)
Hemoglobin: 14.2 g/dL (ref 12.0–15.0)
Immature Granulocytes: 0 %
Lymphocytes Relative: 26 %
Lymphs Abs: 2.9 10*3/uL (ref 0.7–4.0)
MCH: 28.4 pg (ref 26.0–34.0)
MCHC: 33 g/dL (ref 30.0–36.0)
MCV: 86 fL (ref 80.0–100.0)
Monocytes Absolute: 0.7 10*3/uL (ref 0.1–1.0)
Monocytes Relative: 7 %
Neutro Abs: 7.2 10*3/uL (ref 1.7–7.7)
Neutrophils Relative %: 66 %
Platelets: 369 10*3/uL (ref 150–400)
RBC: 5 MIL/uL (ref 3.87–5.11)
RDW: 13.1 % (ref 11.5–15.5)
WBC: 10.9 10*3/uL — ABNORMAL HIGH (ref 4.0–10.5)
nRBC: 0 % (ref 0.0–0.2)

## 2021-12-13 MED ORDER — LORAZEPAM 1 MG PO TABS: 1.0000 mg | ORAL_TABLET | Freq: Once | ORAL | Status: DC

## 2021-12-13 NOTE — Discharge Instructions (Addendum)
We discussed the results of your MRI results, also provided with a copy of this report.  Please continue with symptomatic treatment at home, please schedule an appointment for spine specialist to get symptoms under control.

## 2021-12-13 NOTE — ED Triage Notes (Signed)
Patient sent to ED from [facility] for evaluation of decreased rectal tone, complains of new onset constipation, denies groin numbness, sent to evaluate for cauda equina syndrome. Patient is alert, oriented, and in no apparent distress at this time.

## 2021-12-13 NOTE — ED Provider Notes (Signed)
[facility] Provider Note   CSN: [id number] Arrival date & time: 12/13/21  1239     History  Chief Complaint  Patient presents with   Back Pain    Stephanie [name] is a 41 y.o. female.  41 year old female with a past medical history of depression, IBS, psoriatic arthritis presents to the ED sent in by [facility] for possibility of cauda equina.  Patient reports ongoing back pain for the past 2 weeks, was evaluated in urgent care and given a prescription for steroids, she actually took her last tablet today of the steroids, does report her pain has gone from a 10 out of 10 to a 6 out of 10.  She states having 1 episode of bowel incontinence when she had started the steroids, she also had a reported decrease in rectal bone according to prior provider. Patient has tried wearing a brace to help with her symptoms with some improvement.  Reports the pain is constant, states that it takes her about 20 minutes in order to get dressed in the mornings.  She does have an ongoing history of Psoriatec arthritis and states closely with rheumatology.  She is unable to take any anti-inflammatories due to history of IBS.  She denies any history of IV drug use, no fever, no prior hx of CA.   The history is provided by the patient.  Back Pain Associated symptoms: no abdominal pain, no chest pain and no fever        Home Medications Prior to Admission medications   Medication Sig Start Date End Date Taking? Authorizing Provider  Abatacept (ORENCIA CLICKJECT) 0000000 MG/ML SOAJ Inject into the skin.    [provider]  Aflibercept (EYLEA) 2 MG/0.05ML SOSY by Intravitreal route.    [provider]  albuterol (PROVENTIL HFA;VENTOLIN HFA) 108 (90 BASE) MCG/ACT inhaler Inhale 2 puffs into the lungs every 6 (six) hours as needed. Takes for shortness of breath Patient not taking: Reported on 09/26/2021    [provider]  fexofenadine  (ALLEGRA ALLERGY) 180 MG tablet Take 180 mg by mouth daily.    [provider]  folic acid (FOLVITE) 1 MG tablet Take 1 mg by mouth daily.    [provider]  levothyroxine (SYNTHROID) 112 MCG tablet Take 112 mcg by mouth daily before breakfast.    [provider]  lumateperone tosylate (CAPLYTA) 42 MG capsule Take 42 mg by mouth daily.    [provider]      Allergies    Codeine, Lactose intolerance (gi), and Morphine and related    Review of Systems   Review of Systems  Constitutional:  Negative for fever.  HENT:  Negative for sore throat.   Respiratory:  Negative for shortness of breath.   Cardiovascular:  Negative for chest pain.  Gastrointestinal:  Negative for abdominal pain, diarrhea and vomiting.  Genitourinary:  Positive for difficulty urinating. Negative for flank pain.  Musculoskeletal:  Positive for back pain.  Neurological:  Negative for light-headedness.  All other systems reviewed and are negative.   Physical Exam Updated Vital Signs BP 116/73   Pulse 79   Temp 98.9 F (37.2 C) (Oral)   Resp 18   SpO2 100%  Physical Exam Vitals and nursing note reviewed.  Constitutional:      Appearance: Normal appearance.  HENT:     Head: Normocephalic and atraumatic.  Eyes:     Pupils: Pupils are equal, round, and reactive to  light.  Cardiovascular:     Rate and Rhythm: Normal rate.  Pulmonary:     Effort: Pulmonary effort is normal.  Abdominal:     General: Abdomen is flat.  Musculoskeletal:     Cervical back: Normal range of motion and neck supple.     Lumbar back: Spasms, tenderness and bony tenderness present.     Comments: Full extension along bilateral lower extremities.  Sensation is intact throughout.  No palpable pain along the midline, some spasms noted. Antalgic gate.  Skin:    General: Skin is warm and dry.  Neurological:     Mental Status: She is alert and oriented to person, place, and time.     ED Results /  Procedures / Treatments   Labs (all labs ordered are listed, but only abnormal results are displayed) Labs Reviewed  BASIC METABOLIC PANEL - Abnormal; Notable for the following components:      Result Value   Glucose, Bld 117 (*)    All other components within normal limits  CBC WITH DIFFERENTIAL/PLATELET - Abnormal; Notable for the following components:   WBC 10.9 (*)    All other components within normal limits    EKG None  Radiology MR LUMBAR SPINE WO CONTRAST  Result Date: 12/13/2021 CLINICAL DATA:  Back pain and decreased rectal tone. Concern for cauda equina syndrome. EXAM: MRI LUMBAR SPINE WITHOUT CONTRAST TECHNIQUE: Multiplanar, multisequence MR imaging of the lumbar spine was performed. No intravenous contrast was administered. COMPARISON:  MRI lumbar spine dated July 08, 2016. FINDINGS: Segmentation:  Standard. Alignment:  New trace retrolisthesis at L2-L3, L3-L4, and L4-L5. Vertebrae:  No fracture, evidence of discitis, or bone lesion. Conus medullaris and cauda equina: Conus extends to the L1 level. Conus and cauda equina appear normal. Paraspinal and other soft tissues: Negative. Disc levels: T12-L1:  Negative. L1-L2:  New mild disc bulging.  No stenosis. L2-L3: New mild disc bulging and unchanged mild bilateral facet arthropathy. No stenosis. L3-L4: New mild disc bulging. Unchanged mild bilateral facet arthropathy. No stenosis. L4-L5: Unchanged mild disc bulging and bilateral facet arthropathy. No stenosis. L5-S1: Progressive moderate disc bulging with new superimposed shallow central broad-based posterior disc protrusion. Unchanged mild-to-moderate bilateral facet arthropathy. New left extraforaminal 5 mm synovial cyst without mass effect. New mild-to-moderate bilateral neuroforaminal stenosis. No spinal canal stenosis. IMPRESSION: 1. No acute abnormality or evidence of cauda equina syndrome. 2. Progressive multilevel lumbar spondylosis as described above, worst at L5-S1 where  there is new mild-to-moderate bilateral neuroforaminal stenosis. Electronically Signed   By: Titus Dubin M.D.   On: 12/13/2021 15:11    Procedures Procedures    Medications Ordered in ED Medications - No data to display  ED Course/ Medical Decision Making/ A&P                           Medical Decision Making  Patient here sent by ortho care for concerns of cauda equina. Ongoing back pain for several weeks, noted on a Medrol Dosepak and finished that this morning.  Went for reevaluation and noted to have decreased rectal tone.  Some underlying history of psoriatic arthritis.  No prior history of IV drug use, no fevers, no history of cancer.  On exam patient is overall nontoxic-appearing, vitals are within normal limits.  She has good range of motion of bilateral lower extremities, with sensation intact throughout.  Not reproducible with palpation along the midline of her back, does have a back brace  in place.  Labs were obtained upfront.  BMP without any electrolyte derangement, creatinine levels within normal limits.  CBC with slight leukocytosis.  MRI of the lumbar spine shows 1. No acute abnormality or evidence of cauda equina syndrome.  2. Progressive multilevel lumbar spondylosis as described above,  worst at L5-S1 where there is new mild-to-moderate bilateral  neuroforaminal stenosis.   These results were discussed with patient at length, she was provided with a copy of her MRI.  I did attempt to offer the patient pain medication, however patient is a recovering alcoholic and reports that she is unable to take any narcotic medication.  I did try to offer her some anti-inflammatories, however she has a history of IBS and she is unable to do this.  I did attempt to continue supportive treatment with patches.  I do not suspect discitis since this was negative on MRI and she does not have any history of IV drug use, no recent surgeries.  No history of cancers, no lesions noted on the  MRI.  Patient is stable for outpatient follow-up.  Stable for discharge.   Portions of this note were generated with [employer]. Dictation errors may occur despite best attempts at proofreading.  Final Clinical Impression(s) / ED Diagnoses Final diagnoses:  Acute bilateral low back pain, unspecified whether sciatica present    Rx / DC Orders ED Discharge Orders     None         Janeece Fitting, PA-C 12/13/21 1928    Lennice Sites, DO 12/13/21 1948

## 2021-12-13 NOTE — ED Provider Triage Note (Signed)
Emergency Medicine Provider Triage Evaluation Note  Stephanie [name] , a 41 y.o. female  was evaluated in triage.  Pt complains of back pain and decreased rectal tone seen at [facility] earlier, limited tone on rectal exam sent in for rule out cauda equina.  History of back pain and right radiculopathy..  Review of Systems  Positive: Back pain Negative: Fever  Physical Exam  BP 116/73   Pulse 79   Temp 98.9 F (37.2 C) (Oral)   Resp 18   SpO2 100%  Gen:   Awake, no distress   Resp:  Normal effort  MSK:   Moves extremities without difficulty  Other:  Normal strength of the lower extremities  Medical Decision Making  Medically screening exam initiated at 1:36 PM.  Appropriate orders placed.  Stephanie [name] was informed that the remainder of the evaluation will be completed by another provider, this initial triage assessment does not replace that evaluation, and the importance of remaining in the ED until their evaluation is complete.  Work-up initiated   Margarita Mail, PA-C 12/13/21 1341

## 2021-12-21 ENCOUNTER — Ambulatory Visit (HOSPITAL_BASED_OUTPATIENT_CLINIC_OR_DEPARTMENT_OTHER): Payer: Managed Care, Other (non HMO) | Admitting: Family Medicine

## 2021-12-21 DIAGNOSIS — Z713 Dietary counseling and surveillance: Secondary | ICD-10-CM

## 2021-12-21 NOTE — Patient Instructions (Addendum)
Emphasize whole, real foods over highly processed foods as often as possible.     Goals:  1. Include vegetables in at least 10 meals per week.      - If you use canned soup sometimes, first microwave some vegetables, add the soup, then re-heat.   2. Practice Tai Chi daily (as tolerated; listen to your body).    3. Text your sponsor daily: a review of the day; how you feel it went with respect to achieving balance between needs of yourself and others.  -  In addition, write in your schedule book what you can do to help ensure you meet your own needs.   -  And add to family wall calendar, to share with your husband, a minimum of 3 specific activities per week.  Document progress on above goals using whatever method works best for you.     Follow-up in-office appt on Tuesday, November 28 at 4 PM.

## 2021-12-21 NOTE — Progress Notes (Signed)
Medical Nutrition Therapy PCP Rachell Cipro, MD [facility] Chucky May, MD Appt start time: 1600 end time: 1700 (1 hour) Primary concerns today: Referred by Rachell Cipro, MD for MNT related to preDM (A1c 5.7 on 09/05/20), obesity, HLD, and eating D/O.  Diagnoses also include bipolar, hypothyroid, and ex-induced asthma.   Relevant history/background: At age 41, in response to a chaotic home and her parents' divorce, Stephanie [name] started binge-eating sugar.  By 3rd grade, she started restricting, which worsened at puberty.  At age 50, anorexia was extreme, and she lost 25 lb in a month.  She started medication for bipolar D/O before age 70, and then started binge-eating sugary foods again and gained a lot of weight.  She lost to 150s in early 74s, before getting pregnant with her daughter.  Lost pregnancy weight using [employer] again after the birth of her daughter, and went through a manic phase where weight loss was maintained.  She started seeing [facility] for body dysmorphia in Sept 2021, after which she reintroduced sugar to her diet.  With a family hx of diabetes, she had avoided sugar for the previous 20 years, but last fall's sugar "allowance" led to frequent intake of sugary foods and weight gain.  Stephanie [name] wants to be able to maintain moderation with respect to diet, while also reducing her DM risk and stabilizing weight and appetite.  Prior to getting her own therapy re. body image, Kaitlan's 9-YO daughter started seeing a therapist for concerns about disordered eating, but is doing pretty well right now.  Stephanie [name]'s mom has her own disordered eating, and poor appetite control.  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  Currently tracking food intake with [employer] app.  She lives with her 9-YO daughter and husband.   Assessment:   Stephanie [name] has recently had debilitating back pain.  MRI revealed degenerative disc disease.  Stephanie [name]  arthiritis medication no longer relieves pain, so her rheumatologist has prescribed Humera, whic she hopes to start soon.  Is exploring possibility of ankylosing spondylitis; has an appt with orthopedist Phylliss Bob, MD at [facility].   Sung has been attending [employer] for several years.  Over the summer, she realized she was "done" with not succeeding at managing her weight.  She has tracked food each day since June 10, and she has an accountability partner.  She has also had food sensitivity testing, which indicated intolerances to cantaloupe, tomatoes, inulin.     Weight: 186.8 lb. (201.6 lb on 09/26/21). (Ht is 63".)  This is a ~23-lb weight loss since April 2023.   Usual eating pattern:  Most days getting 3 meals and 2 snacks.  Vegetable intake:  5.  Usual physical activity:  Back pain has precluded activity in past couple of weeks, although she has recently started to practice Tai Chi to the degree she can with current back problems.  (Still a member at [employer]).   Weekly meal planning: Doing regularly (Saturdays).   Weekday lunch prep:  Needs improvement; hasn't been doing weekend prep.   Sweets:  Usually ~3 per week.  Has sometimes planned ahead to incorporate moderate portions of sweets.   Journaling:  Has not jounalled re. self needs other than first 2 weeks after last appt.   Frequent temptations:  Was difficult to resist carb's during 7-day prednisone treatment.  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  Trying to be cautious that she does not swing into anorexic thinking, as she has a h/o.    24-hr recall:  (Up at 4 AM; awoke with pain, tried to sleep, but could not) B (7:15 AM)-  1 1/2 c Cheerios, 1 c lactaid f-f milk, water Snk (10 AM)-  5 oz Chobani yogurt, 1 tsp maple syrup, 9 blueberries L (12 PM)-  1 pc chx alfredo lasagne, 5 dried green bean snacks, water Snk ( PM)-  --- D  (6:30 PM)-  2 scrmbld eggs, 1/2 oz cheese, 2 (frzn) biscuits, 1 tbsp margarine, water Snk ( PM)-  --- Typical day? Yes.     Nutritional Diagnosis: Noted progress on NB-2.1 Physical inactivity as related to energy intake as evidenced by walking/hiking a couple times a week.    Handouts given during visit include: After-Visit Summary (AVS)  Monitoring/Evaluation:  Dietary intake, exercise, and body weight in 8 week(s).

## 2021-12-22 ENCOUNTER — Encounter: Payer: Self-pay | Admitting: Rehabilitative and Restorative Service Providers"

## 2021-12-22 ENCOUNTER — Ambulatory Visit
Payer: Managed Care, Other (non HMO) | Attending: Physical Medicine and Rehabilitation | Admitting: Rehabilitative and Restorative Service Providers"

## 2021-12-22 ENCOUNTER — Other Ambulatory Visit: Payer: Self-pay

## 2021-12-22 DIAGNOSIS — M5136 Other intervertebral disc degeneration, lumbar region: Secondary | ICD-10-CM | POA: Insufficient documentation

## 2021-12-22 DIAGNOSIS — M5459 Other low back pain: Secondary | ICD-10-CM | POA: Diagnosis not present

## 2021-12-22 DIAGNOSIS — R252 Cramp and spasm: Secondary | ICD-10-CM

## 2021-12-22 DIAGNOSIS — M6281 Muscle weakness (generalized): Secondary | ICD-10-CM

## 2021-12-22 NOTE — Therapy (Signed)
OUTPATIENT PHYSICAL THERAPY THORACOLUMBAR EVALUATION   Patient Name: Stephanie [name] MRN: [id number] DOB:[birth year], 41 y.o., female Today's Date: 12/22/2021   PT End of Session - 12/22/21 1025     Visit Number 1    Date for PT Re-Evaluation 02/16/22    Authorization Type [employer]    PT Start Time 1016    PT Stop Time 1055    PT Time Calculation (min) 39 min    Activity Tolerance Patient tolerated treatment well    Behavior During Therapy WFL for tasks assessed/performed             Past Medical History:  Diagnosis Date   Acid reflux    Anxiety    Arthritis    Asthma    Bipolar depression (HCC)    Carpal tunnel syndrome    Depression    Hypothyroid    IBS (irritable bowel syndrome)    Normal pregnancy, first 07/27/2011   Sinusitis    SVD (spontaneous vaginal delivery) 07/28/2011   SVD (spontaneous vaginal delivery) 07/28/2011   Past Surgical History:  Procedure Laterality Date   extraction of wisdom teeth     Patient Active Problem List   Diagnosis Date Noted   SVD (spontaneous vaginal delivery) 07/28/2011    PCP: Lewis Moccasin, MD  REFERRING PROVIDER: Sheran Luz, MD   REFERRING DIAG: M51.36 (ICD-10-CM) - Other intervertebral disc degeneration, lumbar region   Rationale for Evaluation and Treatment Rehabilitation  THERAPY DIAG:  Other low back pain  Muscle weakness (generalized)  Cramp and spasm  ONSET DATE: 12/05/2021  SUBJECTIVE:                                                                                                                                                                                           SUBJECTIVE STATEMENT: Pt reports that on 12/05/21, she began having severe pain in her low back and the pain sometimes radiates down to her right leg.  Pt has had sciatica before and states that this pain feels differently.  Pt reports that she just started cyclobenzaprine and has noted noted any improvement yet, but is hoping that it  will help.    PERTINENT HISTORY:  Pt reports when she was pregnant 10 years ago while in a Zumba class, she started having pelvic pain and incontinence and saw pelvic PT at that time.  Plantar fasciitis, Right side carpal tunnel, bipolar disorder, anxiety, Psoriatic Arthritis.  About 6 years ago, pt reports that she was having difficulty lifting her legs and was having increased falls.  PAIN:  Are you having pain? Yes: NPRS scale: 7/10 Pain location: low back  Pain description: burning, stabbing Aggravating factors: bending, sidelying, sitting Relieving factors: lying supine   PRECAUTIONS: None  WEIGHT BEARING RESTRICTIONS No  FALLS:  Has patient fallen in last 6 months? No  LIVING ENVIRONMENT: Lives with: lives with their family Lives in: House/apartment Stairs: Yes: Internal: 1 steps; none and External: 1 steps; none Has following equipment at home: None  Recommended grab bar for shower for safety  OCCUPATION: Runner, broadcasting/film/video at [employer]  PLOF: Independent  PATIENT GOALS:  To be able to do daily activities without increased pain.   OBJECTIVE:   DIAGNOSTIC FINDINGS:  Lumbar MRI on 12/13/21: IMPRESSION: 1. No acute abnormality or evidence of cauda equina syndrome. 2. Progressive multilevel lumbar spondylosis as described above, worst at L5-S1 where there is new mild-to-moderate bilateral neuroforaminal stenosis.   PATIENT SURVEYS:  12/22/2021:  FOTO 19% (Projected 45% by visit 13)  SCREENING FOR RED FLAGS: Bowel or bladder incontinence: Yes: has history of incontinence, has worked with pelvic floor PT in the past Spinal tumors: No Cauda equina syndrome: No Compression fracture: No Abdominal aneurysm: No  COGNITION:  Overall cognitive status: Within functional limits for tasks assessed     SENSATION: Reports occasional paraesthesias and burning throughout body at times  MUSCLE LENGTH: Hamstring tightness noted  POSTURE: rounded  shoulders and forward head  PALPATION: Tender to palpation along lumbar paraspinals with muscle spasms noted  LUMBAR ROM:  Decreased at least 50% with pain  LOWER EXTREMITY ROM:     WFL  LOWER EXTREMITY MMT:    12/22/2021: Right hip strength of 4-/5, right hamstring strength of 4/5 Otherwise, WFL  LUMBAR SPECIAL TESTS:  Slump test: Positive  FUNCTIONAL TESTS:  5 times sit to stand: TBD Timed up and go (TUG): TBD  GAIT: Distance walked: >50 ft Assistive device utilized: None Level of assistance: Complete Independence Comments: Antalgic gait noted    TODAY'S TREATMENT  12/22/2021:  Reviewed HEP that has been provided during previous sessions in years past (see below)   PATIENT EDUCATION:  Education details: Reviewed HEP and reprinted Person educated: Patient Education method: Explanation and Handouts Education comprehension: verbalized understanding   HOME EXERCISE PROGRAM: Access Code: [email or web address] URL: [email or web address] Date: 12/22/2021 Prepared by: Clydie Braun Johannes Everage  Exercises - Sidelying Transversus Abdominis Bracing  - 1 x daily - 7 x weekly - 1 sets - 10 reps - 10 hold - Single Leg Stance with Support  - 1 x daily - 7 x weekly - 1 sets - 3 reps - 10 hold - Stride Stance Weight Shift  - 1 x daily - 7 x weekly - 1 sets - 15 reps - 3 hold - Forward Step Up  - 1 x daily - 7 x weekly - 3 sets - 10 reps - Side Lunge Adductor Stretch  - 1 x daily - 7 x weekly - 1 sets - 3 reps - 30 hold - Supine Figure 4 Piriformis Stretch  - 1 x daily - 7 x weekly - 1 sets - 3 reps - 20 hold - Seated Piriformis Stretch  - 1 x daily - 7 x weekly - 1 sets - 3 reps - 20 hold - Supine Hip Adductor Stretch  - 1 x daily - 7 x weekly - 1 sets - 3 reps - 20 hold - Standing Isometric Hip Abduction with Ball on Wall  - 1 x daily - 7 x weekly - 1 sets - 5 reps - 5 hold - Bird Dog  - 1 x daily -  7 x weekly - 1 sets - 10 reps - Standing Hip Hinge with Dowel  - 1 x daily - 7 x  weekly - 1 sets - 10 reps - Hip Flexor Stretch at [employer] of Bed (Mirrored)  - 1 x daily - 7 x weekly - 1 sets - 3 reps - 20 hold - Supine March with Elevation  - 1 x daily - 7 x weekly - 1 sets - 10 reps - Seated Transversus Abdominis Bracing  - 1 x daily - 7 x weekly - 1 sets - 10 reps - Standing Low Shoulder Row with Anchored Resistance  - 1 x daily - 7 x weekly - 1 sets - 10 reps - Standing Shoulder Extension with Resistance  - 1 x daily - 7 x weekly - 1 sets - 10 reps - Standing Diagonal Shoulder Extension with Anchored Resistance  - 1 x daily - 7 x weekly - 1 sets - 10 reps - Standing Diagonal Shoulder Extension with Anchored Resistance  - 1 x daily - 7 x weekly - 1 sets - 10 reps - Clamshell  - 1 x daily - 7 x weekly - 1 sets - 10 reps - Sidelying Reverse Clamshell  - 1 x daily - 7 x weekly - 1 sets - 10 reps - Half Kneeling Hip Flexor Stretch with 3-Way Reach  - 1 x daily - 7 x weekly - 1 sets - 10 reps - Half Kneeling Hip Flexor Stretch with Sidebend  - 1 x daily - 7 x weekly - 1 sets - 10 reps - Half-Kneeling Shoulder Horizontal Abduction with Resistance  - 1 x daily - 7 x weekly - 3 sets - 10 reps - Kneeling Diagonal Chop with Anchored Resistance  - 1 x daily - 7 x weekly - 3 sets - 10 reps - Supine Alternating Knee Taps with Hands  - 1 x daily - 7 x weekly - 1 sets - 10 reps - Lateral Step Up  - 1 x daily - 7 x weekly - 2 sets - 10 reps - Half Dead Lift with Kettlebell  - 1 x daily - 7 x weekly - 1 sets - 10 reps - Gastroc Stretch on Wall  - 1 x daily - 7 x weekly - 1 sets - 2 reps - 30 hold - Standing Soleus Stretch  - 1 x daily - 7 x weekly - 1 sets - 2 reps - 30 hold - Seated Plantar Fascia Mobilization with Small Ball  - 1 x daily - 7 x weekly - 1 sets - 60 hold - Seated Plantar Fascia Stretch  - 1 x daily - 7 x weekly - 3 sets - 10 reps  ASSESSMENT:  CLINICAL IMPRESSION: Patient is a 41 y.o. female who was seen today for physical therapy evaluation and treatment for low back  pain.  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  Pt reports that starting last month, she started having some increased back pain that spasms and leads to pain throughout her body.  Pt presents with right hip weakness, back pain, difficulty sitting for prolonged periods, difficulty walking and performing other functional tasks.  Pt has a history of plantar fasciitis and states that the pain has overall improved, but she does occasionally have pain associated.  Pt states that she has a Higher education careers adviser at [employer] and is interested at aquatic PT as  well as things that she can do at the gym.  Pt would benefit from skilled PT to progress towards goal related activities.    OBJECTIVE IMPAIRMENTS decreased balance, difficulty walking, decreased strength, increased muscle spasms, impaired flexibility, postural dysfunction, and pain.   ACTIVITY LIMITATIONS carrying, sitting, squatting, sleeping, and stairs  PARTICIPATION LIMITATIONS: cleaning, laundry, community activity, and occupation  PERSONAL FACTORS Past/current experiences, Time since onset of injury/illness/exacerbation, and 3+ comorbidities: Plantar Fasciitis,   are also affecting patient's functional outcome.   REHAB POTENTIAL: Good  CLINICAL DECISION MAKING: Evolving/moderate complexity  EVALUATION COMPLEXITY: Moderate   GOALS: Goals reviewed with patient? Yes  SHORT TERM GOALS: Target date: 01/12/2022  Pt will be independent with initial HEP. Baseline: Goal status: INITIAL  2.  Pt will report at least a 30% improvement in symptoms since starting PT. Baseline:  Goal status: INITIAL   LONG TERM GOALS: Target date: 02/16/2022  Pt will be independent with advanced HEP. Baseline:  Goal status: INITIAL  2.  Pt will increase FOTO to at least 45% to demonstrate improvements in functional mobility. Baseline:  Goal status: INITIAL  3.   Pt to report ability to return to working out at [employer] without increased pain. Baseline:  Goal status: INITIAL  4.  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 Baseline:  Goal status: INITIAL  5.  Pt to increase right hip and hamstring strength to at least 4+/5 to allow her to perform functional tasks. Baseline:  Goal status: INITIAL    PLAN: PT FREQUENCY: 2x/week  PT DURATION: 8 weeks  PLANNED INTERVENTIONS: Therapeutic exercises, Therapeutic activity, Neuromuscular re-education, Balance training, Gait training, Patient/Family education, Self Care, Joint mobilization, Joint manipulation, Stair training, Aquatic Therapy, Dry Needling, Electrical stimulation, Spinal manipulation, Spinal mobilization, Cryotherapy, Moist heat, Taping, Vasopneumatic device, Traction, Ultrasound, Ionotophoresis 4mg /ml Dexamethasone, Manual therapy, and Re-evaluation.  PLAN FOR NEXT SESSION: assess and progress HEP as indicated, core stability, flexibility, dry needling/manual therapy as indicated.   Mckenzie Toruno, PT 12/22/2021, 10:28 AM

## 2021-12-26 ENCOUNTER — Ambulatory Visit: Payer: Managed Care, Other (non HMO) | Admitting: Rehabilitative and Restorative Service Providers"

## 2021-12-26 ENCOUNTER — Encounter: Payer: Self-pay | Admitting: Rehabilitative and Restorative Service Providers"

## 2021-12-26 DIAGNOSIS — M6281 Muscle weakness (generalized): Secondary | ICD-10-CM

## 2021-12-26 DIAGNOSIS — M5136 Other intervertebral disc degeneration, lumbar region: Secondary | ICD-10-CM | POA: Diagnosis not present

## 2021-12-26 DIAGNOSIS — M5459 Other low back pain: Secondary | ICD-10-CM

## 2021-12-26 DIAGNOSIS — R252 Cramp and spasm: Secondary | ICD-10-CM

## 2021-12-26 NOTE — Therapy (Signed)
OUTPATIENT PHYSICAL THERAPY TREATMENT NOTE   Patient Name: Stephanie [name] MRN: [id number] DOB:02-03-1981, 41 y.o., female Today's Date: 12/26/2021   PT End of Session - 12/26/21 0732     Visit Number 2    Date for PT Re-Evaluation 02/16/22    Authorization Type [employer]    PT Start Time 0730    PT Stop Time 0800    PT Time Calculation (min) 30 min    Activity Tolerance Patient tolerated treatment well    Behavior During Therapy [facility] for tasks assessed/performed             Past Medical History:  Diagnosis Date   Acid reflux    Anxiety    Arthritis    Asthma    Bipolar depression ([place])    Carpal tunnel syndrome    Depression    Hypothyroid    IBS (irritable bowel syndrome)    Normal pregnancy, first 07/27/2011   Sinusitis    SVD (spontaneous vaginal delivery) 07/28/2011   SVD (spontaneous vaginal delivery) 07/28/2011   Past Surgical History:  Procedure Laterality Date   extraction of wisdom teeth     Patient Active Problem List   Diagnosis Date Noted   SVD (spontaneous vaginal delivery) 07/28/2011    PCP: Fanny Bien, MD  REFERRING PROVIDER: Suella Broad, MD   REFERRING DIAG: M51.36 (ICD-10-CM) - Other intervertebral disc degeneration, lumbar region   Rationale for Evaluation and Treatment Rehabilitation  THERAPY DIAG:  Other low back pain  Muscle weakness (generalized)  Cramp and spasm  ONSET DATE: 12/05/2021  SUBJECTIVE:                                                                                                                                                                                           SUBJECTIVE STATEMENT: Pt reports that she has had a little better sleep with use of muscle relaxants, but is still having increased pain.   PERTINENT HISTORY:  Pt reports when she was pregnant 10 years ago while in a Zumba class, she started having pelvic pain and incontinence and saw pelvic PT at that time.  Plantar fasciitis, Right side  carpal tunnel, bipolar disorder, anxiety, Psoriatic Arthritis.  About 6 years ago, pt reports that she was having difficulty lifting her legs and was having increased falls.  PAIN:  Are you having pain? Yes: NPRS scale: 7/10 Pain location: low back Pain description: burning, stabbing Aggravating factors: bending, sidelying, sitting Relieving factors: lying supine   PRECAUTIONS: None  WEIGHT BEARING RESTRICTIONS No  FALLS:  Has patient fallen in last 6 months? No  LIVING ENVIRONMENT: Lives with: lives  with their family Lives in: House/apartment Stairs: Yes: Internal: 1 steps; none and External: 1 steps; none Has following equipment at home: None  Recommended grab bar for shower for safety  OCCUPATION: teacher at [employer]  PLOF: Independent  PATIENT GOALS:  To be able to do daily activities without increased pain.   OBJECTIVE:   DIAGNOSTIC FINDINGS:  Lumbar MRI on 12/13/21: IMPRESSION: 1. No acute abnormality or evidence of cauda equina syndrome. 2. Progressive multilevel lumbar spondylosis as described above, worst at L5-S1 where there is new mild-to-moderate bilateral neuroforaminal stenosis.   PATIENT SURVEYS:  12/22/2021:  FOTO 19% (Projected 45% by visit 13)  SCREENING FOR RED FLAGS: Bowel or bladder incontinence: Yes: has history of incontinence, has worked with pelvic floor PT in the past Spinal tumors: No Cauda equina syndrome: No Compression fracture: No Abdominal aneurysm: No  COGNITION:  Overall cognitive status: Within functional limits for tasks assessed     SENSATION: Reports occasional paraesthesias and burning throughout body at times  MUSCLE LENGTH: Hamstring tightness noted  POSTURE: rounded shoulders and forward head  PALPATION: Tender to palpation along lumbar paraspinals with muscle spasms noted  LUMBAR ROM:  Decreased at least 50% with pain  LOWER EXTREMITY ROM:     WFL  LOWER EXTREMITY MMT:     12/22/2021: Right hip strength of 4-/5, right hamstring strength of 4/5 Otherwise, WFL  LUMBAR SPECIAL TESTS:  Slump test: Positive  FUNCTIONAL TESTS:  12/26/2021: 5 times sit to stand: 19.9 sec Timed up and go (TUG): 10.5 sec  GAIT: Distance walked: >50 ft Assistive device utilized: None Level of assistance: Complete Independence Comments: Antalgic gait noted    TODAY'S TREATMENT: 12/26/2021: Nustep level 3 x6 min with PT present to discuss status Trigger Point Dry-Needling  Treatment instructions: Expect mild to moderate muscle soreness. S/S of pneumothorax if dry needled over a lung field, and to seek immediate medical attention should they occur. Patient verbalized understanding of these instructions and education. Patient Consent Given: Yes Education handout provided: Yes Muscles treated: right thoracic and lumbar multifidi, right glute and piriformis Electrical stimulation performed: No Parameters: N/A Treatment response/outcome: utilized skilled palpation to locate and identify trigger points.  Able to palpate twitch response and muscle elongation Manual Therapy:  following dry needling, performed soft tissue mobilization to thoracic and lumbar region Seated piriformis stretch 1x20 sec bilat Seated 3 way pball rollout with green pball 3x10 sec each    12/22/2021:  Reviewed HEP that has been provided during previous sessions in years past (see below)   PATIENT EDUCATION:  Education details: Reviewed HEP and reprinted Person educated: Patient Education method: Explanation and Handouts Education comprehension: verbalized understanding   HOME EXERCISE PROGRAM: Access Code: [email or web address] URL: [email or web address] Date: 12/22/2021 Prepared by: Clydie Braun Chelsei Mcchesney  Exercises - Sidelying Transversus Abdominis Bracing  - 1 x daily - 7 x weekly - 1 sets - 10 reps - 10 hold - Single Leg Stance with Support  - 1 x daily - 7 x weekly - 1 sets - 3 reps - 10  hold - Stride Stance Weight Shift  - 1 x daily - 7 x weekly - 1 sets - 15 reps - 3 hold - Forward Step Up  - 1 x daily - 7 x weekly - 3 sets - 10 reps - Side Lunge Adductor Stretch  - 1 x daily - 7 x weekly - 1 sets - 3 reps - 30 hold - Supine Figure 4 Piriformis Stretch  - 1  x daily - 7 x weekly - 1 sets - 3 reps - 20 hold - Seated Piriformis Stretch  - 1 x daily - 7 x weekly - 1 sets - 3 reps - 20 hold - Supine Hip Adductor Stretch  - 1 x daily - 7 x weekly - 1 sets - 3 reps - 20 hold - Standing Isometric Hip Abduction with Ball on Wall  - 1 x daily - 7 x weekly - 1 sets - 5 reps - 5 hold - Bird Dog  - 1 x daily - 7 x weekly - 1 sets - 10 reps - Standing Hip Hinge with Dowel  - 1 x daily - 7 x weekly - 1 sets - 10 reps - Hip Flexor Stretch at [employer] of Bed (Mirrored)  - 1 x daily - 7 x weekly - 1 sets - 3 reps - 20 hold - Supine March with Elevation  - 1 x daily - 7 x weekly - 1 sets - 10 reps - Seated Transversus Abdominis Bracing  - 1 x daily - 7 x weekly - 1 sets - 10 reps - Standing Low Shoulder Row with Anchored Resistance  - 1 x daily - 7 x weekly - 1 sets - 10 reps - Standing Shoulder Extension with Resistance  - 1 x daily - 7 x weekly - 1 sets - 10 reps - Standing Diagonal Shoulder Extension with Anchored Resistance  - 1 x daily - 7 x weekly - 1 sets - 10 reps - Standing Diagonal Shoulder Extension with Anchored Resistance  - 1 x daily - 7 x weekly - 1 sets - 10 reps - Clamshell  - 1 x daily - 7 x weekly - 1 sets - 10 reps - Sidelying Reverse Clamshell  - 1 x daily - 7 x weekly - 1 sets - 10 reps - Half Kneeling Hip Flexor Stretch with 3-Way Reach  - 1 x daily - 7 x weekly - 1 sets - 10 reps - Half Kneeling Hip Flexor Stretch with Sidebend  - 1 x daily - 7 x weekly - 1 sets - 10 reps - Half-Kneeling Shoulder Horizontal Abduction with Resistance  - 1 x daily - 7 x weekly - 3 sets - 10 reps - Kneeling Diagonal Chop with Anchored Resistance  - 1 x daily - 7 x weekly - 3 sets - 10 reps -  Supine Alternating Knee Taps with Hands  - 1 x daily - 7 x weekly - 1 sets - 10 reps - Lateral Step Up  - 1 x daily - 7 x weekly - 2 sets - 10 reps - Half Dead Lift with Kettlebell  - 1 x daily - 7 x weekly - 1 sets - 10 reps - Gastroc Stretch on Wall  - 1 x daily - 7 x weekly - 1 sets - 2 reps - 30 hold - Standing Soleus Stretch  - 1 x daily - 7 x weekly - 1 sets - 2 reps - 30 hold - Seated Plantar Fascia Mobilization with Small Ball  - 1 x daily - 7 x weekly - 1 sets - 60 hold - Seated Plantar Fascia Stretch  - 1 x daily - 7 x weekly - 3 sets - 10 reps  ASSESSMENT:  CLINICAL IMPRESSION: Ms Gullikson presents to skilled PT with continued reports of increased pain.  Pt reports feeling some better secondary to getting better sleep with the muscle relaxants.  Pt did report some looseness following  dry needling.  Pt able to participate in objective testing, but continues to report difficulty with sit to/from stand transfer.  Pt continues to require skilled PT to progress towards goal related activities.   OBJECTIVE IMPAIRMENTS decreased balance, difficulty walking, decreased strength, increased muscle spasms, impaired flexibility, postural dysfunction, and pain.   ACTIVITY LIMITATIONS carrying, sitting, squatting, sleeping, and stairs  PARTICIPATION LIMITATIONS: cleaning, laundry, community activity, and occupation  PERSONAL FACTORS Past/current experiences, Time since onset of injury/illness/exacerbation, and 3+ comorbidities: Plantar Fasciitis,   are also affecting patient's functional outcome.   REHAB POTENTIAL: Good  CLINICAL DECISION MAKING: Evolving/moderate complexity  EVALUATION COMPLEXITY: Moderate   GOALS: Goals reviewed with patient? Yes  SHORT TERM GOALS: Target date: 01/12/2022  Pt will be independent with initial HEP. Baseline: Goal status: IN PROGRESS  2.  Pt will report at least a 30% improvement in symptoms since starting PT. Baseline:  Goal status:  INITIAL   LONG TERM GOALS: Target date: 02/16/2022  Pt will be independent with advanced HEP. Baseline:  Goal status: INITIAL  2.  Pt will increase FOTO to at least 45% to demonstrate improvements in functional mobility. Baseline:  Goal status: INITIAL  3.  Pt to report ability to return to working out at [employer] without increased pain. Baseline:  Goal status: INITIAL  4.  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 Baseline:  Goal status: INITIAL  5.  Pt to increase right hip and hamstring strength to at least 4+/5 to allow her to perform functional tasks. Baseline:  Goal status: INITIAL    PLAN: PT FREQUENCY: 2x/week  PT DURATION: 8 weeks  PLANNED INTERVENTIONS: Therapeutic exercises, Therapeutic activity, Neuromuscular re-education, Balance training, Gait training, Patient/Family education, Self Care, Joint mobilization, Joint manipulation, Stair training, Aquatic Therapy, Dry Needling, Electrical stimulation, Spinal manipulation, Spinal mobilization, Cryotherapy, Moist heat, Taping, Vasopneumatic device, Traction, Ultrasound, Ionotophoresis 4mg /ml Dexamethasone, Manual therapy, and Re-evaluation.  PLAN FOR NEXT SESSION: assess and progress HEP as indicated, core stability, flexibility, dry needling/manual therapy as indicated.   , PT, DPT 12/26/2021, 8:24 AM   [facility] [address], [address] [place], [place] Phone # [phone] Fax [phone]

## 2021-12-26 NOTE — Patient Instructions (Signed)

## 2022-01-02 ENCOUNTER — Ambulatory Visit: Payer: Managed Care, Other (non HMO) | Admitting: Rehabilitative and Restorative Service Providers"

## 2022-01-02 ENCOUNTER — Encounter: Payer: Self-pay | Admitting: Rehabilitative and Restorative Service Providers"

## 2022-01-02 DIAGNOSIS — R252 Cramp and spasm: Secondary | ICD-10-CM

## 2022-01-02 DIAGNOSIS — M5459 Other low back pain: Secondary | ICD-10-CM

## 2022-01-02 DIAGNOSIS — M5136 Other intervertebral disc degeneration, lumbar region: Secondary | ICD-10-CM | POA: Diagnosis not present

## 2022-01-02 DIAGNOSIS — M6281 Muscle weakness (generalized): Secondary | ICD-10-CM

## 2022-01-02 NOTE — Therapy (Signed)
OUTPATIENT PHYSICAL THERAPY TREATMENT NOTE   Patient Name: Stephanie [name] MRN: [id number] DOB:08-18-1980, 41 y.o., female Today's Date: 01/02/2022   PT End of Session - 01/02/22 0734     Visit Number 3    Date for PT Re-Evaluation 02/16/22    Authorization Type [employer]    Authorization - Visit Number 3    Authorization - Number of Visits 25    PT Start Time 0732    PT Stop Time 0800    PT Time Calculation (min) 28 min    Activity Tolerance Patient tolerated treatment well    Behavior During Therapy [facility] for tasks assessed/performed             Past Medical History:  Diagnosis Date   Acid reflux    Anxiety    Arthritis    Asthma    Bipolar depression ([place])    Carpal tunnel syndrome    Depression    Hypothyroid    IBS (irritable bowel syndrome)    Normal pregnancy, first 07/27/2011   Sinusitis    SVD (spontaneous vaginal delivery) 07/28/2011   SVD (spontaneous vaginal delivery) 07/28/2011   Past Surgical History:  Procedure Laterality Date   extraction of wisdom teeth     Patient Active Problem List   Diagnosis Date Noted   SVD (spontaneous vaginal delivery) 07/28/2011    PCP: Fanny Bien, MD  REFERRING PROVIDER: Suella Broad, MD   REFERRING DIAG: M51.36 (ICD-10-CM) - Other intervertebral disc degeneration, lumbar region   Rationale for Evaluation and Treatment Rehabilitation  THERAPY DIAG:  Other low back pain  Muscle weakness (generalized)  Cramp and spasm  ONSET DATE: 12/05/2021  SUBJECTIVE:                                                                                                                                                                                           SUBJECTIVE STATEMENT: Pt states that the dry needling seemed to help last visit.   PERTINENT HISTORY:  Pt reports when she was pregnant 10 years ago while in a [employer]umb[employer] class, she started having pelvic pain and incontinence and saw pelvic PT at that time.  Plantar  fasciitis, Right side carpal tunnel, bipolar disorder, anxiety, Psoriatic Arthritis.  About 6 years ago, pt reports that she was having difficulty lifting her legs and was having increased falls.  PAIN:  Are you having pain? Yes: NPRS scale: 5/10 Pain location: low back Pain description: burning, stabbing Aggravating factors: bending, sidelying, sitting Relieving factors: lying supine   PRECAUTIONS: None  WEIGHT BEARING RESTRICTIONS No  FALLS:  Has patient fallen in last 6 months?  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   OBJECTIVE:   DIAGNOSTIC FINDINGS:  Lumbar MRI on 12/13/21: IMPRESSION: 1. No acute abnormality or evidence of cauda equina syndrome. 2. Progressive multilevel lumbar spondylosis as described above, worst at L5-S1 where there is new mild-to-moderate bilateral neuroforaminal stenosis.   PATIENT SURVEYS:  12/22/2021:  FOTO 19% (Projected 45% by visit 13)  SCREENING FOR RED FLAGS: Bowel or bladder incontinence: Yes: has history of incontinence, has worked with pelvic floor PT in the past Spinal tumors: No Cauda equina syndrome: No Compression fracture: No Abdominal aneurysm: No  COGNITION:  Overall cognitive status: Within functional limits for tasks assessed     SENSATION: Reports occasional paraesthesias and burning throughout body at times  MUSCLE LENGTH: Hamstring tightness noted  POSTURE: rounded shoulders and forward head  PALPATION: Tender to palpation along lumbar paraspinals with muscle spasms noted  LUMBAR ROM:  Decreased at least 50% with pain  LOWER EXTREMITY ROM:      WFL  LOWER EXTREMITY MMT:    12/22/2021: Right hip strength of 4-/5, right hamstring strength of 4/5 Otherwise, WFL  LUMBAR SPECIAL TESTS:  Slump test: Positive  FUNCTIONAL TESTS:  12/26/2021: 5 times sit to stand: 19.9 sec Timed up and go (TUG): 10.5 sec  GAIT: Distance walked: >50 ft Assistive device utilized: None Level of assistance: Complete Independence Comments: Antalgic gait noted    TODAY'S TREATMENT: 01/02/2022: Nustep level 3 x5 min with PT present to discuss status Seated piriformis stretch 1x20 sec bilat Seated 3 way pball rollout with green pball 3x10 sec each Trigger Point Dry-Needling  Treatment instructions: Expect mild to moderate muscle soreness. S/S of pneumothorax if dry needled over a lung field, and to seek immediate medical attention should they occur. Patient verbalized understanding of these instructions and education. Patient Consent Given: Yes Education handout provided: Yes Muscles treated: right thoracic and lumbar multifidi, right glute and piriformis Electrical stimulation performed: No Parameters: N/A Treatment response/outcome: utilized skilled palpation to locate and identify trigger points.  Able to palpate twitch response and muscle elongation Manual Therapy:  following dry needling, performed soft tissue mobilization to thoracic and lumbar region   12/26/2021: Nustep level 3 x6 min with PT present to discuss status Trigger Point Dry-Needling  Treatment instructions: Expect mild to moderate muscle soreness. S/S of pneumothorax if dry needled over a lung field, and to seek immediate medical attention should they occur. Patient verbalized understanding of these instructions and education. Patient Consent Given: Yes Education handout provided: Yes Muscles treated: right thoracic and lumbar multifidi, right glute and piriformis Electrical stimulation performed: No Parameters: N/A Treatment response/outcome: utilized skilled  palpation to locate and identify trigger points.  Able to palpate twitch response and muscle elongation Manual Therapy:  following dry needling, performed soft tissue mobilization to thoracic and lumbar region Seated piriformis stretch 1x20 sec bilat Seated 3 way pball rollout with green pball 3x10 sec each    12/22/2021:  Reviewed HEP that has been provided during previous sessions in years past (see below)   PATIENT EDUCATION:  Education details: Reviewed HEP and reprinted Person educated: Patient Education method: Explanation and Handouts Education comprehension: verbalized understanding  HOME EXERCISE PROGRAM: Access Code: [email or web address] URL: [email or web address] Date: 01/02/2022 Prepared by: Shelby Dubin [employer]  Exercises - Sidelying Transversus Abdominis Bracing  - 1 x daily - 7 x weekly - 1 sets - 10 reps - 10 hold - Clamshell  - 1 x daily - 7 x weekly - 1 sets - 10 reps - Sidelying Reverse Clamshell  - 1 x daily - 7 x weekly - 1 sets - 10 reps - Supine Piriformis Stretch with Foot on Ground  - 1 x daily - 7 x weekly - 1 sets - 2 reps - 20 sec hold - Supine Hip Adductor Stretch  - 1 x daily - 7 x weekly - 1 sets - 3 reps - 20 hold - Supine March with Elevation  - 1 x daily - 7 x weekly - 1 sets - 10 reps - Supine Alternating Knee Taps with Hands  - 1 x daily - 7 x weekly - 1 sets - 10 reps - Hip Flexor Stretch at Edge of Bed (Mirrored)  - 1 x daily - 7 x weekly - 1 sets - 3 reps - 20 hold - Bird Dog  - 1 x daily - 7 x weekly - 1 sets - 10 reps - Seated Piriformis Stretch  - 1 x daily - 7 x weekly - 1 sets - 3 reps - 20 hold - Seated Transversus Abdominis Bracing  - 1 x daily - 7 x weekly - 1 sets - 10 reps - Single Leg Stance with Support  - 1 x daily - 7 x weekly - 1 sets - 3 reps - 10 hold - Stride Stance Weight Shift  - 1 x daily - 7 x weekly - 1 sets - 15 reps - 3 hold - Forward Step Up  - 1 x daily - 7 x weekly - 3 sets - 10 reps - Side Lunge Adductor Stretch  - 1  x daily - 7 x weekly - 1 sets - 3 reps - 30 hold - Standing Isometric Hip Abduction with Ball on Wall  - 1 x daily - 7 x weekly - 1 sets - 5 reps - 5 hold - Standing Hip Hinge with Dowel  - 1 x daily - 7 x weekly - 1 sets - 10 reps - Standing Low Shoulder Row with Anchored Resistance  - 1 x daily - 7 x weekly - 1 sets - 10 reps - Standing Shoulder Extension with Resistance  - 1 x daily - 7 x weekly - 1 sets - 10 reps - Standing Diagonal Shoulder Extension with Anchored Resistance  - 1 x daily - 7 x weekly - 1 sets - 10 reps - Half Dead Lift with Kettlebell  - 1 x daily - 7 x weekly - 1 sets - 10 reps - Half Kneeling Hip Flexor Stretch with 3-Way Reach  - 1 x daily - 7 x weekly - 1 sets - 10 reps - Half Kneeling Hip Flexor Stretch with Sidebend  - 1 x daily - 7 x weekly - 1 sets - 10 reps - Half-Kneeling Shoulder Horizontal Abduction with Resistance  - 1 x daily - 7 x weekly - 3 sets - 10 reps - Kneeling Diagonal Chop with Anchored Resistance  - 1 x daily - 7 x weekly - 3 sets - 10 reps - Lateral Step Up  - 1 x daily - 7 x weekly - 2 sets - 10 reps - Gastroc Stretch on Wall  - 1 x daily -  7 x weekly - 1 sets - 2 reps - 30 hold - Standing Soleus Stretch  - 1 x daily - 7 x weekly - 1 sets - 2 reps - 30 hold - Seated Plantar Fascia Mobilization with Small Ball  - 1 x daily - 7 x weekly - 1 sets - 60 hold - Seated Plantar Fascia Stretch  - 1 x daily - 7 x weekly - 3 sets - 10 reps  ASSESSMENT:  CLINICAL IMPRESSION: Ms Stephanie [name] presents to skilled PT with reports of having some relief of pain following dry needling last visit.  Pt reports that she had some difficulty with figure 4 piriformis stretch, so changed HEP to the supine piriformis stretch with foot on ground.  Pt provided with red tband for use at home, as she has a green band and it may be too aggressive at this time. Pt continues to state decreased pain and looseness following dry needling.  Pt continues to require skilled PT to progress  towards goal related activities.   OBJECTIVE IMPAIRMENTS decreased balance, difficulty walking, decreased strength, increased muscle spasms, impaired flexibility, postural dysfunction, and pain.   ACTIVITY LIMITATIONS carrying, sitting, squatting, sleeping, and stairs  PARTICIPATION LIMITATIONS: cleaning, laundry, community activity, and occupation  PERSONAL FACTORS Past/current experiences, Time since onset of injury/illness/exacerbation, and 3+ comorbidities: Plantar Fasciitis,   are also affecting patient's functional outcome.   REHAB POTENTIAL: Good  CLINICAL DECISION MAKING: Evolving/moderate complexity  EVALUATION COMPLEXITY: Moderate   GOALS: Goals reviewed with patient? Yes  SHORT TERM GOALS: Target date: 01/12/2022  Pt will be independent with initial HEP. Baseline: Goal status: MET  2.  Pt will report at least a 30% improvement in symptoms since starting PT. Baseline:  Goal status: INITIAL   LONG TERM GOALS: Target date: 02/16/2022  Pt will be independent with advanced HEP. Baseline:  Goal status: INITIAL  2.  Pt will increase FOTO to at least 45% to demonstrate improvements in functional mobility. Baseline:  Goal status: INITIAL  3.  Pt to report ability to return to working out at [employer] without increased pain. Baseline:  Goal status: INITIAL  4.  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 Baseline:  Goal status: INITIAL  5.  Pt to increase right hip and hamstring strength to at least 4+/5 to allow her to perform functional tasks. Baseline:  Goal status: INITIAL    PLAN: PT FREQUENCY: 2x/week  PT DURATION: 8 weeks  PLANNED INTERVENTIONS: Therapeutic exercises, Therapeutic activity, Neuromuscular re-education, Balance training, Gait training, Patient/Family education, Self Care, Joint mobilization, Joint manipulation, Stair training, Aquatic Therapy, Dry Needling, Electrical  stimulation, Spinal manipulation, Spinal mobilization, Cryotherapy, Moist heat, Taping, Vasopneumatic device, Traction, Ultrasound, Ionotophoresis 75m/ml Dexamethasone, Manual therapy, and Re-evaluation.  PLAN FOR NEXT SESSION: assess and progress HEP as indicated, core stability, flexibility, dry needling/manual therapy as indicated.   SJuel Burrow PT, DPT 01/02/2022, 9:41 AM   [facility] SPie TownGWolfe [place] [address]Phone # [phone] [phone]

## 2022-01-09 ENCOUNTER — Ambulatory Visit: Payer: Managed Care, Other (non HMO) | Admitting: Rehabilitative and Restorative Service Providers"

## 2022-01-09 ENCOUNTER — Encounter: Payer: Self-pay | Admitting: Rehabilitative and Restorative Service Providers"

## 2022-01-09 DIAGNOSIS — M5136 Other intervertebral disc degeneration, lumbar region: Secondary | ICD-10-CM | POA: Diagnosis not present

## 2022-01-09 DIAGNOSIS — M5459 Other low back pain: Secondary | ICD-10-CM

## 2022-01-09 DIAGNOSIS — M6281 Muscle weakness (generalized): Secondary | ICD-10-CM

## 2022-01-09 DIAGNOSIS — R252 Cramp and spasm: Secondary | ICD-10-CM

## 2022-01-09 NOTE — Therapy (Signed)
OUTPATIENT PHYSICAL THERAPY TREATMENT NOTE   Patient Name: Stephanie [name] MRN: [id number] DOB:08/27/1980, 41 y.o., female Today's Date: 01/09/2022   PT End of Session - 01/09/22 0736     Visit Number 4    Date for PT Re-Evaluation 02/16/22    Authorization Type [employer]    Authorization - Visit Number 4    Authorization - Number of Visits 25    PT Start Time [id number]    PT Stop Time 0800    PT Time Calculation (min) 27 min    Activity Tolerance Patient tolerated treatment well    Behavior During Therapy [facility] for tasks assessed/performed             Past Medical History:  Diagnosis Date   Acid reflux    Anxiety    Arthritis    Asthma    Bipolar depression ([place])    Carpal tunnel syndrome    Depression    Hypothyroid    IBS (irritable bowel syndrome)    Normal pregnancy, first 07/27/2011   Sinusitis    SVD (spontaneous vaginal delivery) 07/28/2011   SVD (spontaneous vaginal delivery) 07/28/2011   Past Surgical History:  Procedure Laterality Date   extraction of wisdom teeth     Patient Active Problem List   Diagnosis Date Noted   SVD (spontaneous vaginal delivery) 07/28/2011    PCP: Fanny Bien, MD  REFERRING PROVIDER: Suella Broad, MD   REFERRING DIAG: M51.36 (ICD-10-CM) - Other intervertebral disc degeneration, lumbar region   Rationale for Evaluation and Treatment Rehabilitation  THERAPY DIAG:  Other low back pain  Muscle weakness (generalized)  Cramp and spasm  ONSET DATE: 12/05/2021  SUBJECTIVE:                                                                                                                                                                                           SUBJECTIVE STATEMENT: Pt states that her PCP is referring her to a neurosurgeon and may be adding a referral for cervical region.    PERTINENT HISTORY:  Pt reports when she was pregnant 10 years ago while in a [employer]umb[employer] class, she started having pelvic pain and  incontinence and saw pelvic PT at that time.  Plantar fasciitis, Right side carpal tunnel, bipolar disorder, anxiety, Psoriatic Arthritis.  About 6 years ago, pt reports that she was having difficulty lifting her legs and was having increased falls.  PAIN:  Are you having pain? Yes: NPRS scale: 6/10 Pain location: low back Pain description: burning, stabbing Aggravating factors: bending, sidelying, sitting Relieving factors: lying supine   PRECAUTIONS: None  WEIGHT BEARING RESTRICTIONS No  FALLS:  Has patient fallen in last 6 months? 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   OBJECTIVE:   DIAGNOSTIC FINDINGS:  Lumbar MRI on 12/13/21: IMPRESSION: 1. No acute abnormality or evidence of cauda equina syndrome. 2. Progressive multilevel lumbar spondylosis as described above, worst at L5-S1 where there is new mild-to-moderate bilateral neuroforaminal stenosis.   PATIENT SURVEYS:  12/22/2021:  FOTO 19% (Projected 45% by visit 13)  SCREENING FOR RED FLAGS: Bowel or bladder incontinence: Yes: has history of incontinence, has worked with pelvic floor PT in the past Spinal tumors: No Cauda equina syndrome: No Compression fracture: No Abdominal aneurysm: No  COGNITION:  Overall cognitive status: Within functional limits for tasks assessed     SENSATION: Reports occasional paraesthesias and burning throughout body at times  MUSCLE LENGTH: Hamstring tightness noted  POSTURE: rounded shoulders and forward head  PALPATION: Tender to palpation along lumbar paraspinals with muscle spasms noted  LUMBAR ROM:  Decreased at least  50% with pain  LOWER EXTREMITY ROM:     WFL  LOWER EXTREMITY MMT:    12/22/2021: Right hip strength of 4-/5, right hamstring strength of 4/5 Otherwise, WFL  LUMBAR SPECIAL TESTS:  Slump test: Positive  FUNCTIONAL TESTS:  12/26/2021: 5 times sit to stand: 19.9 sec Timed up and go (TUG): 10.5 sec  GAIT: Distance walked: >50 ft Assistive device utilized: None Level of assistance: Complete Independence Comments: Antalgic gait noted    TODAY'S TREATMENT: 01/09/2022: Nustep level 3 x5 min with PT present to discuss status Seated piriformis stretch 1x20 sec bilat Seated 3 way pball rollout with green pball 3x10 sec each Seated hamstring stretch on table 2x20 sec 4 way pelvic tilt seated on dynadisc  Trigger Point Dry-Needling  Treatment instructions: Expect mild to moderate muscle soreness. S/S of pneumothorax if dry needled over a lung field, and to seek immediate medical attention should they occur. Patient verbalized understanding of these instructions and education. Patient Consent Given: Yes Education handout provided: Yes Muscles treated: bilatt thoracic and lumbar multifidi, bilat glute and piriformis Electrical stimulation performed: No Parameters: N/A Treatment response/outcome: utilized skilled palpation to locate and identify trigger points.  Able to palpate twitch response and muscle elongation   01/02/2022: Nustep level 3 x5 min with PT present to discuss status Seated piriformis stretch 1x20 sec bilat Seated 3 way pball rollout with green pball 3x10 sec each Trigger Point Dry-Needling  Treatment instructions: Expect mild to moderate muscle soreness. S/S of pneumothorax if dry needled over a lung field, and to seek immediate medical attention should they occur. Patient verbalized understanding of these instructions and education. Patient Consent Given: Yes Education handout provided: Yes Muscles treated: right thoracic and lumbar multifidi, right glute and  piriformis Electrical stimulation performed: No Parameters: N/A Treatment response/outcome: utilized skilled palpation to locate and identify trigger points.  Able to palpate twitch response and muscle elongation Manual Therapy:  following dry needling, performed soft tissue mobilization to thoracic and lumbar region   12/26/2021: Nustep level 3 x6 min with PT present to discuss status Trigger Point Dry-Needling  Treatment instructions: Expect mild to moderate muscle soreness. S/S of pneumothorax if dry needled over a lung  field, and to seek immediate medical attention should they occur. Patient verbalized understanding of these instructions and education. Patient Consent Given: Yes Education handout provided: Yes Muscles treated: right thoracic and lumbar multifidi, right glute and piriformis Electrical stimulation performed: No Parameters: N/A Treatment response/outcome: utilized skilled palpation to locate and identify trigger points.  Able to palpate twitch response and muscle elongation Manual Therapy:  following dry needling, performed soft tissue mobilization to thoracic and lumbar region Seated piriformis stretch 1x20 sec bilat Seated 3 way pball rollout with green pball 3x10 sec each    PATIENT EDUCATION:  Education details: Reviewed HEP and reprinted Person educated: Patient Education method: Explanation and Handouts Education comprehension: verbalized understanding   HOME EXERCISE PROGRAM: Access Code: [email or web address] URL: [email or web address] Date: 01/09/2022 Prepared by: Shelby Dubin Adonys Wildes  Exercises - Sidelying Transversus Abdominis Bracing  - 1 x daily - 7 x weekly - 1 sets - 10 reps - 10 hold - Clamshell  - 1 x daily - 7 x weekly - 1 sets - 10 reps - Sidelying Reverse Clamshell  - 1 x daily - 7 x weekly - 1 sets - 10 reps - Supine Posterior Pelvic Tilt  - 1 x daily - 7 x weekly - 2 sets - 10 reps - Supine Piriformis Stretch with Foot on Ground  - 1 x daily -  7 x weekly - 1 sets - 2 reps - 20 sec hold - Supine Hip Adductor Stretch  - 1 x daily - 7 x weekly - 1 sets - 3 reps - 20 hold - Supine March with Elevation  - 1 x daily - 7 x weekly - 1 sets - 10 reps - Supine Alternating Knee Taps with Hands  - 1 x daily - 7 x weekly - 1 sets - 10 reps - Hip Flexor Stretch at Edge of Bed (Mirrored)  - 1 x daily - 7 x weekly - 1 sets - 3 reps - 20 hold - Bird Dog  - 1 x daily - 7 x weekly - 1 sets - 10 reps - Seated Piriformis Stretch  - 1 x daily - 7 x weekly - 1 sets - 3 reps - 20 hold - Seated Transversus Abdominis Bracing  - 1 x daily - 7 x weekly - 1 sets - 10 reps - Single Leg Stance with Support  - 1 x daily - 7 x weekly - 1 sets - 3 reps - 10 hold - Stride Stance Weight Shift  - 1 x daily - 7 x weekly - 1 sets - 15 reps - 3 hold - Forward Step Up  - 1 x daily - 7 x weekly - 3 sets - 10 reps - Side Lunge Adductor Stretch  - 1 x daily - 7 x weekly - 1 sets - 3 reps - 30 hold - Standing Isometric Hip Abduction with Ball on Wall  - 1 x daily - 7 x weekly - 1 sets - 5 reps - 5 hold - Standing Hip Hinge with Dowel  - 1 x daily - 7 x weekly - 1 sets - 10 reps - Standing Low Shoulder Row with Anchored Resistance  - 1 x daily - 7 x weekly - 1 sets - 10 reps - Standing Shoulder Extension with Resistance  - 1 x daily - 7 x weekly - 1 sets - 10 reps - Standing Diagonal Shoulder Extension with Anchored Resistance  - 1 x daily - 7 x weekly - 1 sets - 10 reps -  Half Dead Lift with Kettlebell  - 1 x daily - 7 x weekly - 1 sets - 10 reps - Half Kneeling Hip Flexor Stretch with 3-Way Reach  - 1 x daily - 7 x weekly - 1 sets - 10 reps - Half Kneeling Hip Flexor Stretch with Sidebend  - 1 x daily - 7 x weekly - 1 sets - 10 reps - Half-Kneeling Shoulder Horizontal Abduction with Resistance  - 1 x daily - 7 x weekly - 3 sets - 10 reps - Kneeling Diagonal Chop with Anchored Resistance  - 1 x daily - 7 x weekly - 3 sets - 10 reps - Lateral Step Up  - 1 x daily - 7 x weekly -  2 sets - 10 reps - Gastroc Stretch on Wall  - 1 x daily - 7 x weekly - 1 sets - 2 reps - 30 hold - Standing Soleus Stretch  - 1 x daily - 7 x weekly - 1 sets - 2 reps - 30 hold - Seated Plantar Fascia Mobilization with Small Ball  - 1 x daily - 7 x weekly - 1 sets - 60 hold - Seated Plantar Fascia Stretch  - 1 x daily - 7 x weekly - 3 sets - 10 reps  ASSESSMENT:  CLINICAL IMPRESSION: Ms Mantione presents to skilled PT with reports that her PCP is wanting to order PT for her cervical and lumbar region and will schedule a new evaluation.  Pt awaiting an appointment to follow up with a neurosurgeon consult secondary to her symptoms and needing to self stimulate prior to being able to perform a BM.  Pt continues to report a relief of symptoms with dry needling.  Pt continues to require skilled PT to progress towards goal related activities.   OBJECTIVE IMPAIRMENTS decreased balance, difficulty walking, decreased strength, increased muscle spasms, impaired flexibility, postural dysfunction, and pain.   ACTIVITY LIMITATIONS carrying, sitting, squatting, sleeping, and stairs  PARTICIPATION LIMITATIONS: cleaning, laundry, community activity, and occupation  PERSONAL FACTORS Past/current experiences, Time since onset of injury/illness/exacerbation, and 3+ comorbidities: Plantar Fasciitis,   are also affecting patient's functional outcome.   REHAB POTENTIAL: Good  CLINICAL DECISION MAKING: Evolving/moderate complexity  EVALUATION COMPLEXITY: Moderate   GOALS: Goals reviewed with patient? Yes  SHORT TERM GOALS: Target date: 01/12/2022  Pt will be independent with initial HEP. Baseline: Goal status: MET  2.  Pt will report at least a 30% improvement in symptoms since starting PT. Baseline:  Goal status: INITIAL   LONG TERM GOALS: Target date: 02/16/2022  Pt will be independent with advanced HEP. Baseline:  Goal status: IN PROGRESS  2.  Pt will increase FOTO to at least 45% to  demonstrate improvements in functional mobility. Baseline:  Goal status: INITIAL  3.  Pt to report ability to return to working out at [employer] without increased pain. Baseline:  Goal status: INITIAL  4.  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 Baseline:  Goal status: INITIAL  5.  Pt to increase right hip and hamstring strength to at least 4+/5 to allow her to perform functional tasks. Baseline:  Goal status: INITIAL    PLAN: PT FREQUENCY: 2x/week  PT DURATION: 8 weeks  PLANNED INTERVENTIONS: Therapeutic exercises, Therapeutic activity, Neuromuscular re-education, Balance training, Gait training, Patient/Family education, Self Care, Joint mobilization, Joint manipulation, Stair training, Aquatic Therapy, Dry Needling, Electrical stimulation, Spinal manipulation, Spinal mobilization, Cryotherapy, Moist heat, Taping,  Vasopneumatic device, Traction, Ultrasound, Ionotophoresis 80m/ml Dexamethasone, Manual therapy, and Re-evaluation.  PLAN FOR NEXT SESSION: assess and progress HEP as indicated, core stability, flexibility, dry needling/manual therapy as indicated.   SJuel Burrow PT, DPT 01/09/2022, 10:07 AM   [facility]GM[address] [place] [address]Phone # [phone] [phone]

## 2022-01-16 ENCOUNTER — Ambulatory Visit: Payer: Managed Care, Other (non HMO) | Admitting: Rehabilitative and Restorative Service Providers"

## 2022-01-16 ENCOUNTER — Encounter: Payer: Self-pay | Admitting: Rehabilitative and Restorative Service Providers"

## 2022-01-16 DIAGNOSIS — M5136 Other intervertebral disc degeneration, lumbar region: Secondary | ICD-10-CM | POA: Diagnosis not present

## 2022-01-16 DIAGNOSIS — M5459 Other low back pain: Secondary | ICD-10-CM

## 2022-01-16 DIAGNOSIS — M6281 Muscle weakness (generalized): Secondary | ICD-10-CM

## 2022-01-16 DIAGNOSIS — R252 Cramp and spasm: Secondary | ICD-10-CM

## 2022-01-16 NOTE — Therapy (Signed)
OUTPATIENT PHYSICAL THERAPY TREATMENT NOTE   Patient Name: Stephanie [name] MRN: [id number] DOB:1980/06/11, 41 y.o., female Today's Date: 01/16/2022   PT End of Session - 01/16/22 0738     Visit Number 5    Date for PT Re-Evaluation 02/16/22    Authorization Type [employer]    Authorization - Visit Number 5    Authorization - Number of Visits 25    PT Start Time [phone]    PT Stop Time 0800    PT Time Calculation (min) 24 min    Activity Tolerance Patient tolerated treatment well    Behavior During Therapy [facility] for tasks assessed/performed             Past Medical History:  Diagnosis Date   Acid reflux    Anxiety    Arthritis    Asthma    Bipolar depression ([place])    Carpal tunnel syndrome    Depression    Hypothyroid    IBS (irritable bowel syndrome)    Normal pregnancy, first 07/27/2011   Sinusitis    SVD (spontaneous vaginal delivery) 07/28/2011   SVD (spontaneous vaginal delivery) 07/28/2011   Past Surgical History:  Procedure Laterality Date   extraction of wisdom teeth     Patient Active Problem List   Diagnosis Date Noted   SVD (spontaneous vaginal delivery) 07/28/2011    PCP: Fanny Bien, MD  REFERRING PROVIDER: Suella Broad, MD   REFERRING DIAG: M51.36 (ICD-10-CM) - Other intervertebral disc degeneration, lumbar region   Rationale for Evaluation and Treatment Rehabilitation  THERAPY DIAG:  Other low back pain  Muscle weakness (generalized)  Cramp and spasm  ONSET DATE: 12/05/2021  SUBJECTIVE:                                                                                                                                                                                           SUBJECTIVE STATEMENT: Pt reports that dry needling usually helps her feel better for a few days, then she starts to have some pain again.  States that the pushing of the cart is painful at times.  Pt reports that she saw the neurosurgeon and he is going to have her  undergo more testing.  PERTINENT HISTORY:  Pt reports when she was pregnant 10 years ago while in a [employer]umb[employer] class, she started having pelvic pain and incontinence and saw pelvic PT at that time.  Plantar fasciitis, Right side carpal tunnel, bipolar disorder, anxiety, Psoriatic Arthritis.  About 6 years ago, pt reports that she was having difficulty lifting her legs and was having increased falls.  PAIN:  Are you having pain?  Yes: NPRS scale: 6/10 Pain location: low back Pain description: burning, stabbing Aggravating factors: bending, sidelying, sitting Relieving factors: lying supine   PRECAUTIONS: None  WEIGHT BEARING RESTRICTIONS No  FALLS:  Has patient fallen in last 6 months? 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   OBJECTIVE:   DIAGNOSTIC FINDINGS:  Lumbar MRI on 12/13/21: IMPRESSION: 1. No acute abnormality or evidence of cauda equina syndrome. 2. Progressive multilevel lumbar spondylosis as described above, worst at L5-S1 where there is new mild-to-moderate bilateral neuroforaminal stenosis.   PATIENT SURVEYS:  12/22/2021:  FOTO 19% (Projected 45% by visit 13)  SCREENING FOR RED FLAGS: Bowel or bladder incontinence: Yes: has history of incontinence, has worked with pelvic floor PT in the past Spinal tumors: No Cauda equina syndrome: No Compression fracture: No Abdominal aneurysm: No  COGNITION:  Overall cognitive status: Within functional limits for tasks assessed     SENSATION: Reports occasional paraesthesias and burning throughout body at times  MUSCLE LENGTH: Hamstring tightness noted  POSTURE:  rounded shoulders and forward head  PALPATION: Tender to palpation along lumbar paraspinals with muscle spasms noted  LUMBAR ROM:  Decreased at least 50% with pain  LOWER EXTREMITY ROM:     WFL  LOWER EXTREMITY MMT:    12/22/2021: Right hip strength of 4-/5, right hamstring strength of 4/5 Otherwise, WFL  LUMBAR SPECIAL TESTS:  Slump test: Positive  FUNCTIONAL TESTS:  12/26/2021: 5 times sit to stand: 19.9 sec Timed up and go (TUG): 10.5 sec  GAIT: Distance walked: >50 ft Assistive device utilized: None Level of assistance: Complete Independence Comments: Antalgic gait noted    TODAY'S TREATMENT: 01/16/2022: Nustep level 3 x5 min with PT present to discuss status Seated modified deadlift with 1# 2x10 Seated hamstring stretch on table 2x20 sec Standing shoulder ER and shoulder horizontal abduction with yellow tband 2x10   01/09/2022: Nustep level 3 x5 min with PT present to discuss status Seated piriformis stretch 1x20 sec bilat Seated 3 way pball rollout with green pball 3x10 sec each Seated hamstring stretch on table 2x20 sec 4 way pelvic tilt seated on dynadisc  Trigger Point Dry-Needling  Treatment instructions: Expect mild to moderate muscle soreness. S/S of pneumothorax if dry needled over a lung field, and to seek immediate medical attention should they occur. Patient verbalized understanding of these instructions and education. Patient Consent Given: Yes Education handout provided: Yes Muscles treated: bilatt thoracic and lumbar multifidi, bilat glute and piriformis Electrical stimulation performed: No Parameters: N/A Treatment response/outcome: utilized skilled palpation to locate and identify trigger points.  Able to palpate twitch response and muscle elongation   01/02/2022: Nustep level 3 x5 min with PT present to discuss status Seated piriformis stretch 1x20 sec bilat Seated 3 way pball rollout with green pball 3x10 sec each Trigger Point  Dry-Needling  Treatment instructions: Expect mild to moderate muscle soreness. S/S of pneumothorax if dry needled over a lung field, and to seek immediate medical attention should they occur. Patient verbalized understanding of these instructions and education. Patient Consent Given: Yes Education handout provided: Yes Muscles treated: right thoracic and lumbar multifidi, right glute and piriformis Electrical stimulation performed: No Parameters: N/A Treatment  response/outcome: utilized skilled palpation to locate and identify trigger points.  Able to palpate twitch response and muscle elongation Manual Therapy:  following dry needling, performed soft tissue mobilization to thoracic and lumbar region     PATIENT EDUCATION:  Education details: Reviewed HEP and reprinted Person educated: Patient Education method: Explanation and Handouts Education comprehension: verbalized understanding   HOME EXERCISE PROGRAM: Access Code: [email or web address] URL: [email or web address] Date: 01/09/2022 Prepared by: Shelby Dubin Zaniel Marineau  Exercises - Sidelying Transversus Abdominis Bracing  - 1 x daily - 7 x weekly - 1 sets - 10 reps - 10 hold - Clamshell  - 1 x daily - 7 x weekly - 1 sets - 10 reps - Sidelying Reverse Clamshell  - 1 x daily - 7 x weekly - 1 sets - 10 reps - Supine Posterior Pelvic Tilt  - 1 x daily - 7 x weekly - 2 sets - 10 reps - Supine Piriformis Stretch with Foot on Ground  - 1 x daily - 7 x weekly - 1 sets - 2 reps - 20 sec hold - Supine Hip Adductor Stretch  - 1 x daily - 7 x weekly - 1 sets - 3 reps - 20 hold - Supine March with Elevation  - 1 x daily - 7 x weekly - 1 sets - 10 reps - Supine Alternating Knee Taps with Hands  - 1 x daily - 7 x weekly - 1 sets - 10 reps - Hip Flexor Stretch at Edge of Bed (Mirrored)  - 1 x daily - 7 x weekly - 1 sets - 3 reps - 20 hold - Bird Dog  - 1 x daily - 7 x weekly - 1 sets - 10 reps - Seated Piriformis Stretch  - 1 x daily - 7 x weekly - 1  sets - 3 reps - 20 hold - Seated Transversus Abdominis Bracing  - 1 x daily - 7 x weekly - 1 sets - 10 reps - Single Leg Stance with Support  - 1 x daily - 7 x weekly - 1 sets - 3 reps - 10 hold - Stride Stance Weight Shift  - 1 x daily - 7 x weekly - 1 sets - 15 reps - 3 hold - Forward Step Up  - 1 x daily - 7 x weekly - 3 sets - 10 reps - Side Lunge Adductor Stretch  - 1 x daily - 7 x weekly - 1 sets - 3 reps - 30 hold - Standing Isometric Hip Abduction with Ball on Wall  - 1 x daily - 7 x weekly - 1 sets - 5 reps - 5 hold - Standing Hip Hinge with Dowel  - 1 x daily - 7 x weekly - 1 sets - 10 reps - Standing Low Shoulder Row with Anchored Resistance  - 1 x daily - 7 x weekly - 1 sets - 10 reps - Standing Shoulder Extension with Resistance  - 1 x daily - 7 x weekly - 1 sets - 10 reps - Standing Diagonal Shoulder Extension with Anchored Resistance  - 1 x daily - 7 x weekly - 1 sets - 10 reps - Half Dead Lift with Kettlebell  - 1 x daily - 7 x weekly - 1 sets - 10 reps - Half Kneeling Hip Flexor Stretch with 3-Way Reach  - 1 x daily - 7 x weekly - 1 sets - 10 reps - Half Kneeling Hip Flexor Stretch with Sidebend  - 1 x daily - 7  x weekly - 1 sets - 10 reps - Half-Kneeling Shoulder Horizontal Abduction with Resistance  - 1 x daily - 7 x weekly - 3 sets - 10 reps - Kneeling Diagonal Chop with Anchored Resistance  - 1 x daily - 7 x weekly - 3 sets - 10 reps - Lateral Step Up  - 1 x daily - 7 x weekly - 2 sets - 10 reps - Gastroc Stretch on Wall  - 1 x daily - 7 x weekly - 1 sets - 2 reps - 30 hold - Standing Soleus Stretch  - 1 x daily - 7 x weekly - 1 sets - 2 reps - 30 hold - Seated Plantar Fascia Mobilization with Small Ball  - 1 x daily - 7 x weekly - 1 sets - 60 hold - Seated Plantar Fascia Stretch  - 1 x daily - 7 x weekly - 3 sets - 10 reps  ASSESSMENT:  CLINICAL IMPRESSION: Ms Vigil presents to skilled PT stating that she was able to follow up with the Neurosurgeon and he is going to  order CT scans and EMG.  Patient reports that she feels better after dry needling.  Pt is excited to try her first aquatic PT session later today.  Pt will have a new referral from Dr Ernie Hew to incorporate lumbar and cervical region next week.  Pt continues to report a relief in symptoms with dry needling and provided with yellow theraband for use for HEP.   OBJECTIVE IMPAIRMENTS decreased balance, difficulty walking, decreased strength, increased muscle spasms, impaired flexibility, postural dysfunction, and pain.   ACTIVITY LIMITATIONS carrying, sitting, squatting, sleeping, and stairs  PARTICIPATION LIMITATIONS: cleaning, laundry, community activity, and occupation  PERSONAL FACTORS Past/current experiences, Time since onset of injury/illness/exacerbation, and 3+ comorbidities: Plantar Fasciitis,   are also affecting patient's functional outcome.   REHAB POTENTIAL: Good  CLINICAL DECISION MAKING: Evolving/moderate complexity  EVALUATION COMPLEXITY: Moderate   GOALS: Goals reviewed with patient? Yes  SHORT TERM GOALS: Target date: 01/12/2022  Pt will be independent with initial HEP. Baseline: Goal status: MET  2.  Pt will report at least a 30% improvement in symptoms since starting PT. Baseline:  Goal status: IN PROGRESS   LONG TERM GOALS: Target date: 02/16/2022  Pt will be independent with advanced HEP. Baseline:  Goal status: IN PROGRESS  2.  Pt will increase FOTO to at least 45% to demonstrate improvements in functional mobility. Baseline:  Goal status: INITIAL  3.  Pt to report ability to return to working out at [employer] without increased pain. Baseline:  Goal status: INITIAL  4.  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 Baseline:  Goal status: INITIAL  5.  Pt to increase right hip and hamstring strength to at least 4+/5 to allow her to perform functional tasks. Baseline:  Goal status:  INITIAL    PLAN: PT FREQUENCY: 2x/week  PT DURATION: 8 weeks  PLANNED INTERVENTIONS: Therapeutic exercises, Therapeutic activity, Neuromuscular re-education, Balance training, Gait training, Patient/Family education, Self Care, Joint mobilization, Joint manipulation, Stair training, Aquatic Therapy, Dry Needling, Electrical stimulation, Spinal manipulation, Spinal mobilization, Cryotherapy, Moist heat, Taping, Vasopneumatic device, Traction, Ultrasound, Ionotophoresis 42m/ml Dexamethasone, Manual therapy, and Re-evaluation.  PLAN FOR NEXT SESSION: assess and progress HEP as indicated, core stability, flexibility, dry needling/manual therapy as indicated, aquatic PT.   SJuel Burrow PT, DPT 01/16/2022, 8:57 AM   [facility] [address]  [place], [place] [address] Phone # [phone] Fax [phone]

## 2022-01-18 ENCOUNTER — Ambulatory Visit (HOSPITAL_BASED_OUTPATIENT_CLINIC_OR_DEPARTMENT_OTHER): Payer: Managed Care, Other (non HMO) | Attending: Physical Medicine and Rehabilitation | Admitting: Physical Therapy

## 2022-01-18 ENCOUNTER — Encounter (HOSPITAL_BASED_OUTPATIENT_CLINIC_OR_DEPARTMENT_OTHER): Payer: Self-pay | Admitting: Physical Therapy

## 2022-01-18 DIAGNOSIS — M6281 Muscle weakness (generalized): Secondary | ICD-10-CM | POA: Diagnosis present

## 2022-01-18 DIAGNOSIS — M542 Cervicalgia: Secondary | ICD-10-CM | POA: Diagnosis present

## 2022-01-18 DIAGNOSIS — M5459 Other low back pain: Secondary | ICD-10-CM | POA: Diagnosis not present

## 2022-01-18 DIAGNOSIS — R252 Cramp and spasm: Secondary | ICD-10-CM | POA: Insufficient documentation

## 2022-01-18 NOTE — Therapy (Addendum)
OUTPATIENT PHYSICAL THERAPY TREATMENT NOTE AND LATE ENTRY DISCHARGE SUMMARY   Patient Name: Stephanie [name] MRN: [id number] DOB:09-25-1980, 41 y.o., female Today's Date: 01/18/2022   PT End of Session - 01/18/22 1621     Visit Number 6    Date for PT Re-Evaluation 02/16/22    Authorization Type [employer]    Authorization - Visit Number 6    Authorization - Number of Visits 25    PT Start Time 1619    PT Stop Time 1700    PT Time Calculation (min) 41 min    Activity Tolerance Patient tolerated treatment well    Behavior During Therapy WFL for tasks assessed/performed             Past Medical History:  Diagnosis Date   Acid reflux    Anxiety    Arthritis    Asthma    Bipolar depression ([place])    Carpal tunnel syndrome    Depression    Hypothyroid    IBS (irritable bowel syndrome)    Normal pregnancy, first 07/27/2011   Sinusitis    SVD (spontaneous vaginal delivery) 07/28/2011   SVD (spontaneous vaginal delivery) 07/28/2011   Past Surgical History:  Procedure Laterality Date   extraction of wisdom teeth     Patient Active Problem List   Diagnosis Date Noted   SVD (spontaneous vaginal delivery) 07/28/2011    PCP: Fanny Bien, MD  REFERRING PROVIDER: Suella Broad, MD   REFERRING DIAG: M51.36 (ICD-10-CM) - Other intervertebral disc degeneration, lumbar region   Rationale for Evaluation and Treatment Rehabilitation  THERAPY DIAG:  Other low back pain  Muscle weakness (generalized)  Cramp and spasm  ONSET DATE: 12/05/2021  SUBJECTIVE:                                                                                                                                                                                           SUBJECTIVE STATEMENT: No new changes since last visit.  Continued relief with DN.  Pt reports she has noticed improvement in mobility with ADLs and positions at work.    PERTINENT HISTORY:  Pt reports when she was pregnant 10 years  ago while in a Zumba class, she started having pelvic pain and incontinence and saw pelvic PT at that time.  Plantar fasciitis, Right side carpal tunnel, bipolar disorder, anxiety, Psoriatic Arthritis.  About 6 years ago, pt reports that she was having difficulty lifting her legs and was having increased falls.  PAIN:  Are you having pain? Yes: NPRS scale: 7/10 Pain location: low back Pain description: tight, achy Aggravating factors: twisting Relieving factors: lying supine  PRECAUTIONS: None  WEIGHT BEARING RESTRICTIONS No  FALLS:  Has patient fallen in last 6 months? 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   OBJECTIVE:   DIAGNOSTIC FINDINGS:  Lumbar MRI on 12/13/21: IMPRESSION: 1. No acute abnormality or evidence of cauda equina syndrome. 2. Progressive multilevel lumbar spondylosis as described above, worst at L5-S1 where there is new mild-to-moderate bilateral neuroforaminal stenosis.   PATIENT SURVEYS:  12/22/2021:  FOTO 19% (Projected 45% by visit 13)  SCREENING FOR RED FLAGS: Bowel or bladder incontinence: Yes: has history of incontinence, has worked with pelvic floor PT in the past Spinal tumors: No Cauda equina syndrome: No Compression fracture: No Abdominal aneurysm: No  COGNITION:  Overall cognitive status: Within functional limits for tasks assessed     SENSATION: Reports occasional paraesthesias and burning throughout body at times  MUSCLE LENGTH: Hamstring tightness noted  POSTURE: rounded shoulders and forward head  PALPATION: Tender to palpation along lumbar paraspinals with muscle spasms  noted  LUMBAR ROM:  Decreased at least 50% with pain  LOWER EXTREMITY ROM:     WFL  LOWER EXTREMITY MMT:    12/22/2021: Right hip strength of 4-/5, right hamstring strength of 4/5 Otherwise, WFL  LUMBAR SPECIAL TESTS:  Slump test: Positive  FUNCTIONAL TESTS:  12/26/2021: 5 times sit to stand: 19.9 sec Timed up and go (TUG): 10.5 sec  GAIT: Distance walked: >50 ft Assistive device utilized: None Level of assistance: Complete Independence Comments: Antalgic gait noted    TODAY'S TREATMENT: 01/18/2022: Pt seen for aquatic therapy today.  Treatment took place in water 3.25-4.5 ft in depth at the [facility]. Temp of water was 91.  Pt entered/exited the pool via stairs independently with single rail.  * walking forward / backward - cues for slow pace * holding wall:  heel raises; squats (relaxed pace); L stretch; L stretch with gentle "wag the tail" (not tolerated) * side stepping holding noodle; and without noodle  * high knee marching with back near wall * Holding wall: hip circles in +4 ft water * suspended on yellow noodle (holding wall) cycling, jumping jack legs and cc ski( limiting range) * suspended back float (yellow noodle under arms, blue noodle under legs) with gentle passive side stretching  Pt requires the buoyancy and hydrostatic pressure of water for support, and to offload joints by unweighting joint load by at least 50 % in navel deep water and by at least 75-80% in chest to neck deep water.  Viscosity of the water is needed for resistance of strengthening. Water current perturbations provides challenge to standing balance requiring increased core activation.    01/16/2022: Nustep level 3 x5 min with PT present to discuss status Seated modified deadlift with 1# 2x10 Seated hamstring stretch on table 2x20 sec Standing shoulder ER and shoulder horizontal abduction with yellow tband 2x10   01/09/2022: Nustep level 3 x5 min with PT present  to discuss status Seated piriformis stretch 1x20 sec bilat Seated 3 way pball rollout with green pball 3x10 sec each Seated hamstring stretch on table 2x20 sec 4 way pelvic tilt seated on dynadisc  Trigger Point Dry-Needling  Treatment instructions: Expect mild to  moderate muscle soreness. S/S of pneumothorax if dry needled over a lung field, and to seek immediate medical attention should they occur. Patient verbalized understanding of these instructions and education. Patient Consent Given: Yes Education handout provided: Yes Muscles treated: bilatt thoracic and lumbar multifidi, bilat glute and piriformis Electrical stimulation performed: No Parameters: N/A Treatment response/outcome: utilized skilled palpation to locate and identify trigger points.  Able to palpate twitch response and muscle elongation   01/02/2022: Nustep level 3 x5 min with PT present to discuss status Seated piriformis stretch 1x20 sec bilat Seated 3 way pball rollout with green pball 3x10 sec each Trigger Point Dry-Needling  Treatment instructions: Expect mild to moderate muscle soreness. S/S of pneumothorax if dry needled over a lung field, and to seek immediate medical attention should they occur. Patient verbalized understanding of these instructions and education. Patient Consent Given: Yes Education handout provided: Yes Muscles treated: right thoracic and lumbar multifidi, right glute and piriformis Electrical stimulation performed: No Parameters: N/A Treatment response/outcome: utilized skilled palpation to locate and identify trigger points.  Able to palpate twitch response and muscle elongation Manual Therapy:  following dry needling, performed soft tissue mobilization to thoracic and lumbar region     PATIENT EDUCATION:  Education details: Reviewed HEP and reprinted Person educated: Patient Education method: Explanation and Handouts Education comprehension: verbalized understanding   HOME  EXERCISE PROGRAM: Access Code: [email or web address] URL: [email or web address] Date: 01/09/2022 Prepared by: Shelby Dubin Menke  Exercises - Sidelying Transversus Abdominis Bracing  - 1 x daily - 7 x weekly - 1 sets - 10 reps - 10 hold - Clamshell  - 1 x daily - 7 x weekly - 1 sets - 10 reps - Sidelying Reverse Clamshell  - 1 x daily - 7 x weekly - 1 sets - 10 reps - Supine Posterior Pelvic Tilt  - 1 x daily - 7 x weekly - 2 sets - 10 reps - Supine Piriformis Stretch with Foot on Ground  - 1 x daily - 7 x weekly - 1 sets - 2 reps - 20 sec hold - Supine Hip Adductor Stretch  - 1 x daily - 7 x weekly - 1 sets - 3 reps - 20 hold - Supine March with Elevation  - 1 x daily - 7 x weekly - 1 sets - 10 reps - Supine Alternating Knee Taps with Hands  - 1 x daily - 7 x weekly - 1 sets - 10 reps - Hip Flexor Stretch at Edge of Bed (Mirrored)  - 1 x daily - 7 x weekly - 1 sets - 3 reps - 20 hold - Bird Dog  - 1 x daily - 7 x weekly - 1 sets - 10 reps - Seated Piriformis Stretch  - 1 x daily - 7 x weekly - 1 sets - 3 reps - 20 hold - Seated Transversus Abdominis Bracing  - 1 x daily - 7 x weekly - 1 sets - 10 reps - Single Leg Stance with Support  - 1 x daily - 7 x weekly - 1 sets - 3 reps - 10 hold - Stride Stance Weight Shift  - 1 x daily - 7 x weekly - 1 sets - 15 reps - 3 hold - Forward Step Up  - 1 x daily - 7 x weekly - 3 sets - 10 reps - Side Lunge Adductor Stretch  - 1 x daily - 7 x weekly - 1 sets - 3 reps - 30 hold - Standing Isometric Hip  Abduction with Ball on Wall  - 1 x daily - 7 x weekly - 1 sets - 5 reps - 5 hold - Standing Hip Hinge with Dowel  - 1 x daily - 7 x weekly - 1 sets - 10 reps - Standing Low Shoulder Row with Anchored Resistance  - 1 x daily - 7 x weekly - 1 sets - 10 reps - Standing Shoulder Extension with Resistance  - 1 x daily - 7 x weekly - 1 sets - 10 reps - Standing Diagonal Shoulder Extension with Anchored Resistance  - 1 x daily - 7 x weekly - 1 sets - 10 reps - Half  Dead Lift with Kettlebell  - 1 x daily - 7 x weekly - 1 sets - 10 reps - Half Kneeling Hip Flexor Stretch with 3-Way Reach  - 1 x daily - 7 x weekly - 1 sets - 10 reps - Half Kneeling Hip Flexor Stretch with Sidebend  - 1 x daily - 7 x weekly - 1 sets - 10 reps - Half-Kneeling Shoulder Horizontal Abduction with Resistance  - 1 x daily - 7 x weekly - 3 sets - 10 reps - Kneeling Diagonal Chop with Anchored Resistance  - 1 x daily - 7 x weekly - 3 sets - 10 reps - Lateral Step Up  - 1 x daily - 7 x weekly - 2 sets - 10 reps - Gastroc Stretch on Wall  - 1 x daily - 7 x weekly - 1 sets - 2 reps - 30 hold - Standing Soleus Stretch  - 1 x daily - 7 x weekly - 1 sets - 2 reps - 30 hold - Seated Plantar Fascia Mobilization with Small Ball  - 1 x daily - 7 x weekly - 1 sets - 60 hold - Seated Plantar Fascia Stretch  - 1 x daily - 7 x weekly - 3 sets - 10 reps  ASSESSMENT:  CLINICAL IMPRESSION: Pt reported pain in L ribs to Rt ankle with L hip circles, increased back pain with active lumbar side bend (in L lumbar stretch) and increased UE radicular symptoms with holding noodle with gait.  She reported some relief with cycling, jumping jack legs (suspended) and squats (with hips abdct, at relaxed pace).  Overall, pain level remained 6/10 during session.  Pt states she can't swim, but was confident enough to where she can walk unsupported without difficulty or LOB.  Goals are ongoing.     OBJECTIVE IMPAIRMENTS decreased balance, difficulty walking, decreased strength, increased muscle spasms, impaired flexibility, postural dysfunction, and pain.   ACTIVITY LIMITATIONS carrying, sitting, squatting, sleeping, and stairs  PARTICIPATION LIMITATIONS: cleaning, laundry, community activity, and occupation  PERSONAL FACTORS Past/current experiences, Time since onset of injury/illness/exacerbation, and 3+ comorbidities: Plantar Fasciitis,   are also affecting patient's functional outcome.   REHAB POTENTIAL:  Good  CLINICAL DECISION MAKING: Evolving/moderate complexity  EVALUATION COMPLEXITY: Moderate   GOALS: Goals reviewed with patient? Yes  SHORT TERM GOALS: Target date: 01/12/2022  Pt will be independent with initial HEP. Baseline: Goal status: MET  2.  Pt will report at least a 30% improvement in symptoms since starting PT. Baseline:  Goal status: IN PROGRESS   LONG TERM GOALS: Target date: 02/16/2022  Pt will be independent with advanced HEP. Baseline:  Goal status: IN PROGRESS  2.  Pt will increase FOTO to at least 45% to demonstrate improvements in functional mobility. Baseline:  Goal status: INITIAL  3.  Pt to report  ability to return to working out at [employer] without increased pain. Baseline:  Goal status: INITIAL  4.  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 Baseline:  Goal status: INITIAL  5.  Pt to increase right hip and hamstring strength to at least 4+/5 to allow her to perform functional tasks. Baseline:  Goal status: INITIAL    PLAN: PT FREQUENCY: 2x/week  PT DURATION: 8 weeks  PLANNED INTERVENTIONS: Therapeutic exercises, Therapeutic activity, Neuromuscular re-education, Balance training, Gait training, Patient/Family education, Self Care, Joint mobilization, Joint manipulation, Stair training, Aquatic Therapy, Dry Needling, Electrical stimulation, Spinal manipulation, Spinal mobilization, Cryotherapy, Moist heat, Taping, Vasopneumatic device, Traction, Ultrasound, Ionotophoresis 35m/ml Dexamethasone, Manual therapy, and Re-evaluation.  PLAN FOR NEXT SESSION: assess and progress HEP as indicated, core stability, flexibility, dry needling/manual therapy as indicated, aquatic PT.  JKerin Perna PTA 01/18/22 5:17 PM [facility] [address] [place] [address]Phone: [phone]  Fax:  [phone]     PHYSICAL THERAPY DISCHARGE SUMMARY  Received new orders from Dr ERachell Ciprothat will incorporate cervical to lumbar spine for therapy.  Discharging out this certification with new orders and PT episode to move forward with Dr DErnie Hewsigning orders.  Patient agrees to discharge. Patient goals were not met. Patient is being discharged due to  patient to continue forward with new orders/plan of care to incorporate additional body parts.  SJuel Burrow PT 02/01/22 8:32 AM  [facility] [address] [place] [address]Phone # [phone] [phone]

## 2022-01-23 ENCOUNTER — Encounter: Payer: Self-pay | Admitting: Physical Therapy

## 2022-01-23 ENCOUNTER — Ambulatory Visit: Payer: Managed Care, Other (non HMO) | Attending: Family Medicine | Admitting: Physical Therapy

## 2022-01-23 DIAGNOSIS — M545 Low back pain, unspecified: Secondary | ICD-10-CM | POA: Insufficient documentation

## 2022-01-23 DIAGNOSIS — L4050 Arthropathic psoriasis, unspecified: Secondary | ICD-10-CM | POA: Diagnosis not present

## 2022-01-23 DIAGNOSIS — R252 Cramp and spasm: Secondary | ICD-10-CM

## 2022-01-23 DIAGNOSIS — M5459 Other low back pain: Secondary | ICD-10-CM

## 2022-01-23 DIAGNOSIS — M542 Cervicalgia: Secondary | ICD-10-CM | POA: Diagnosis not present

## 2022-01-23 DIAGNOSIS — M6281 Muscle weakness (generalized): Secondary | ICD-10-CM

## 2022-01-23 NOTE — Therapy (Signed)
OUTPATIENT PHYSICAL THERAPY CERVICAL EVALUATION   Patient Name: Stephanie [name] MRN: [id number] DOB:[birth year], 41 y.o., female Today's Date: 01/23/2022   PT End of Session - 01/23/22 1623     Visit Number 1    Date for PT Re-Evaluation 03/20/22    Authorization Type [employer]    Authorization - Visit Number 7    Authorization - Number of Visits 25    PT Start Time 1620    PT Stop Time 1700    PT Time Calculation (min) 40 min    Activity Tolerance Patient tolerated treatment well    Behavior During Therapy [facility] for tasks assessed/performed             Past Medical History:  Diagnosis Date   Acid reflux    Anxiety    Arthritis    Asthma    Bipolar depression ([place])    Carpal tunnel syndrome    Depression    Hypothyroid    IBS (irritable bowel syndrome)    Normal pregnancy, first 07/27/2011   Sinusitis    SVD (spontaneous vaginal delivery) 07/28/2011   SVD (spontaneous vaginal delivery) 07/28/2011   Past Surgical History:  Procedure Laterality Date   extraction of wisdom teeth     Patient Active Problem List   Diagnosis Date Noted   SVD (spontaneous vaginal delivery) 07/28/2011    PCP: Rachell Cipro, MD  REFERRING PROVIDER: Rachell Cipro, MD  REFERRING DIAG: Neck pain; back pain   THERAPY DIAG:  Cervicalgia  Other low back pain  Muscle weakness (generalized)  Cramp and spasm  Rationale for Evaluation and Treatment: Rehabilitation  ONSET DATE: back 12/05/21, neck  SUBJECTIVE:                                                                                                                                                                                                         SUBJECTIVE STATEMENT: Pt reports that on 12/05/21, she began having severe pain in her low back and the pain sometimes radiates down to her right leg.  Pt has had sciatica before. In the past 2 weeks, she noticed that her Rt foot feels tingly similar to the hand. Standing/unloading  the dishwasher, making beds or bending over increases Rt LE pain. She has a history of carpal tunnel syndrome on the Rt. There was a recent flare up with this over the summer. She has tingling in her Rt hand, in addition to pain in the neck radiating down her Rt arm. Typing, writing, and repetitive hand motions increase the tingling in her hand.  Arm pain is noted more with turning her head either direction.  PERTINENT HISTORY:  Depression, anxiety, psoriatic arthritis, carpal tunnel syndrome  PAIN:  Are you having pain? Yes: NPRS scale: no rating given/10 Pain location: Rt cervical region around C6/C7, Rt posterior shoulder  Pain description: dull, constant Aggravating factors: cervical rotation Lt or Rt  Relieving factors: avoiding a lot of neck movement  PRECAUTIONS: None  WEIGHT BEARING RESTRICTIONS: No  FALLS:  Has patient fallen in last 6 months? No  LIVING ENVIRONMENT: Lives with: lives with their family Lives in: House/apartment Stairs: Yes: Internal: 1 steps; none Has following equipment at home: None  OCCUPATION: Runner, broadcasting/film/video at [employer]  PLOF: Independent  PATIENT GOALS: To be able to do daily activities without increased pain.    NEXT MD VISIT: Neurosurgeon: Dr Mikal Plane follow up after she gets EMG and CT scan of neck and back.  OBJECTIVE:   DIAGNOSTIC FINDINGS:  Lumbar MRI on 12/13/21: IMPRESSION: 1. No acute abnormality or evidence of cauda equina syndrome. 2. Progressive multilevel lumbar spondylosis as described above, worst at L5-S1 where there is new mild-to-moderate bilateral neuroforaminal stenosis.  PATIENT SURVEYS:  FOTO: BACK 19% (Projected 45% by visit 13)   NECK  SCREENING FOR RED FLAGS 12/22/21: Bowel or bladder incontinence: Yes: has history of incontinence, has worked with pelvic floor PT in the past Spinal tumors: No Cauda equina syndrome: No Compression fracture: No Abdominal aneurysm: No  COGNITION: Overall  cognitive status: Within functional limits for tasks assessed  SENSATION: Reports occasional paraesthesias and burning throughout body at times  POSTURE: rounded shoulders and forward head  PALPATION: Muscle spasm and tenderness noted Rt upper trap   CERVICAL ROM: (+) pain with all movements  Active ROM A/PROM (deg) eval  Flexion   Extension 25  Right lateral flexion   Left lateral flexion   Right rotation 40  Left rotation 40   (Blank rows = not tested)  UPPER EXTREMITY ROM:  Reach behind back: Rt to bra line (+) pain, Lt to inferior scapula Reach behind head: Rt C7 (+) pain, Lt T1  Active ROM Right eval Left eval  Shoulder flexion WNL WNL  Shoulder extension    Shoulder abduction    Shoulder adduction    Shoulder extension    Shoulder internal rotation    Shoulder external rotation    Elbow flexion    Elbow extension    Wrist flexion (+) pain end range   Wrist extension    Wrist ulnar deviation    Wrist radial deviation    Wrist pronation    Wrist supination (+) pain end range    (Blank rows = not tested)  UPPER EXTREMITY MMT:  MMT Right eval Left eval  Shoulder flexion 4 4  Shoulder extension    Shoulder abduction 4 4  Shoulder adduction    Shoulder extension    Shoulder internal rotation    Shoulder external rotation    Middle trapezius    Lower trapezius    Elbow flexion 5 5  Elbow extension 5 5  Wrist flexion    Wrist extension    Wrist ulnar deviation    Wrist radial deviation    Wrist pronation 4 5  Wrist supination 4 (+) pain 5  Grip strength 23.3lb 21.6lb   (Blank rows = not tested)   LE MMT: 12/22/2021: Right hip strength of 4-/5, right hamstring strength of 4/5 Otherwise, WFL   CERVICAL SPECIAL TESTS:  Neck flexor muscle endurance  test: not tested due to time limitations 12/22/21: Slump test: Positive  FUNCTIONAL TESTS:    TODAY'S TREATMENT:                                                                                                                               DATE: 01/23/22 Self massage to forearm   PATIENT EDUCATION:  Education details: eval findings/POC Person educated: Patient Education method: Medical illustrator Education comprehension: verbalized understanding and returned demonstration  HOME EXERCISE PROGRAM: Need next visit  ASSESSMENT:  CLINICAL IMPRESSION: Patient is a 41 y.o F who was seen today for physical therapy evaluation and treatment for neck pain and possible radiculopathy. She is currently being seen for her low back pain and radicular Rt LE pain, so today is adding the cervical region to her POC. Pt had a flare-up of carpal tunnel syndrome in the Rt UE. She is also experiencing cervical pain primarily on the Rt, with pain down the Rt UE.    OBJECTIVE IMPAIRMENTS: decreased activity tolerance, decreased mobility, decreased ROM, decreased strength, impaired perceived functional ability, increased muscle spasms, impaired flexibility, impaired sensation, impaired UE functional use, postural dysfunction, and pain.   ACTIVITY LIMITATIONS: carrying, lifting, sleeping, toileting, dressing, reach over head, and caring for others  PARTICIPATION LIMITATIONS: cleaning, laundry, driving, shopping, occupation, and yard work  PERSONAL FACTORS: Behavior pattern, Past/current experiences, Time since onset of injury/illness/exacerbation, and 3+ comorbidities: psoriatic arthritis, chronic LBP, carpal tunnel syndrome  are also affecting patient's functional outcome.   REHAB POTENTIAL: Good  CLINICAL DECISION MAKING: Unstable/unpredictable  EVALUATION COMPLEXITY: High   GOALS: Goals reviewed with patient? Yes  SHORT TERM GOALS: Target date: 03/20/2022   Pt will be independent with her initial HEP Baseline:  Goal status: INITIAL  2.  .  Pt will report at least a 30% improvement in LBP symptoms since starting PT. Baseline:  Goal status: INITIAL      LONG TERM GOALS: Target  date: 03/20/2022  Pt will increase LBP FOTO to at least 45% and cervical FOTO to 55% in order to demonstrate improvements in functional mobility. Baseline:  Goal status: INITIAL  2.  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  Pt will have atleast 10lb improvement in grip strength bilaterally. Baseline: 23lb on Rt  Goal status: INITIAL  4.  Pt will have increase in active cervical ROM to atleast 45 deg bilaterally to improve her ability to check blind spots while driving.  Baseline:  Goal status: INITIAL  5.  Pt will report atleast 50% improvement in her neck pain from the start of PT.  Baseline:  Goal status: INITIAL    PLAN:  PT FREQUENCY: 2x/week  PT DURATION: 8 weeks  PLANNED INTERVENTIONS: Therapeutic exercises, Therapeutic activity, Neuromuscular re-education, Balance training, Gait training, Patient/Family education, Self Care, Joint mobilization, Joint manipulation, Aquatic Therapy, Dry  Needling, Taping, Manual therapy, and Re-evaluation  PLAN FOR NEXT SESSION: update LBP/neck HEP; scap strength; cervical ROM    8:11 PM,01/23/22 Donita Brooks PT, DPT [facility] [place]  [phone]

## 2022-01-25 ENCOUNTER — Ambulatory Visit (HOSPITAL_BASED_OUTPATIENT_CLINIC_OR_DEPARTMENT_OTHER): Payer: Managed Care, Other (non HMO) | Admitting: Physical Therapy

## 2022-01-25 ENCOUNTER — Encounter (HOSPITAL_BASED_OUTPATIENT_CLINIC_OR_DEPARTMENT_OTHER): Payer: Self-pay

## 2022-02-01 ENCOUNTER — Ambulatory Visit (HOSPITAL_BASED_OUTPATIENT_CLINIC_OR_DEPARTMENT_OTHER): Payer: Managed Care, Other (non HMO) | Admitting: Physical Therapy

## 2022-02-01 DIAGNOSIS — M5459 Other low back pain: Secondary | ICD-10-CM

## 2022-02-01 DIAGNOSIS — M542 Cervicalgia: Secondary | ICD-10-CM

## 2022-02-01 DIAGNOSIS — R252 Cramp and spasm: Secondary | ICD-10-CM

## 2022-02-01 DIAGNOSIS — M6281 Muscle weakness (generalized): Secondary | ICD-10-CM

## 2022-02-01 NOTE — Therapy (Signed)
OUTPATIENT PHYSICAL THERAPY CERVICAL TREATMENT   Patient Name: Stephanie [name] MRN: [id number] DOB:Dec 29, 1980, 41 y.o., female Today's Date: 02/01/2022   PT End of Session - 02/01/22 1615     Visit Number 2    Date for PT Re-Evaluation 03/20/22    Authorization Type [employer]    Authorization - Number of Visits 25    PT Start Time 1614    PT Stop Time [id number]    PT Time Calculation (min) 41 min    Activity Tolerance Patient tolerated treatment well    Behavior During Therapy WFL for tasks assessed/performed              Past Medical History:  Diagnosis Date   Acid reflux    Anxiety    Arthritis    Asthma    Bipolar depression ([facility])    Carpal tunnel syndrome    Depression    Hypothyroid    IBS (irritable bowel syndrome)    Normal pregnancy, first 07/27/2011   Sinusitis    SVD (spontaneous vaginal delivery) 07/28/2011   SVD (spontaneous vaginal delivery) 07/28/2011   Past Surgical History:  Procedure Laterality Date   extraction of wisdom teeth     Patient Active Problem List   Diagnosis Date Noted   SVD (spontaneous vaginal delivery) 07/28/2011    PCP: Rachell Cipro, MD  REFERRING PROVIDER: Rachell Cipro, MD  REFERRING DIAG: Neck pain; back pain   THERAPY DIAG:  Cervicalgia  Other low back pain  Muscle weakness (generalized)  Cramp and spasm  Rationale for Evaluation and Treatment: Rehabilitation  ONSET DATE: back 12/05/21, neck  SUBJECTIVE:                                                                                                                                                                                                         SUBJECTIVE STATEMENT: Pt reports she was unloading storage unit on Saturday. Had to take heavy box to donate yesterday.  She reports her stress level is high.   PERTINENT HISTORY:  Depression, anxiety, psoriatic arthritis, carpal tunnel syndrome  PAIN:  Are you having pain? Yes: NPRS scale:8/10 in lower back,  8/10 neck radiating into L arm to fingers Pain location: see above  Pain description: dull, constant Aggravating factors: cervical rotation Lt or Rt and weight bearing into RLE Relieving factors: avoiding a lot of neck movement  PRECAUTIONS: None  WEIGHT BEARING RESTRICTIONS: No  FALLS:  Has patient fallen in last 6 months? No  LIVING ENVIRONMENT: Lives with: lives with their family Lives in: House/apartment Stairs:  Yes: Internal: 1 steps; none Has following equipment at home: None  OCCUPATION: teacher at [employer]: To be able to do daily activities without increased pain.    NEXT MD VISIT: Neurosurgeon: Dr Cyndy Freeze follow up after she gets EMG and CT scan of neck and back.  OBJECTIVE:   DIAGNOSTIC FINDINGS:  Lumbar MRI on 12/13/21: IMPRESSION: 1. No acute abnormality or evidence of cauda equina syndrome. 2. Progressive multilevel lumbar spondylosis as described above, worst at L5-S1 where there is new mild-to-moderate bilateral neuroforaminal stenosis.  PATIENT SURVEYS:  FOTO: BACK 19% (Projected 45% by visit 13)   NECK  SCREENING FOR RED FLAGS 12/22/21: Bowel or bladder incontinence: Yes: has history of incontinence, has worked with pelvic floor PT in the past Spinal tumors: No Cauda equina syndrome: No Compression fracture: No Abdominal aneurysm: No  COGNITION: Overall cognitive status: Within functional limits for tasks assessed  SENSATION: Reports occasional paraesthesias and burning throughout body at times  POSTURE: rounded shoulders and forward head  PALPATION: Muscle spasm and tenderness noted Rt upper trap   CERVICAL ROM: (+) pain with all movements  Active ROM A/PROM (deg) eval  Flexion   Extension 25  Right lateral flexion   Left lateral flexion   Right rotation 40  Left rotation 40   (Blank rows = not tested)  UPPER EXTREMITY ROM:  Reach behind back: Rt to bra line (+)  pain, Lt to inferior scapula Reach behind head: Rt C7 (+) pain, Lt T1  Active ROM Right eval Left eval  Shoulder flexion WNL WNL  Shoulder extension    Shoulder abduction    Shoulder adduction    Shoulder extension    Shoulder internal rotation    Shoulder external rotation    Elbow flexion    Elbow extension    Wrist flexion (+) pain end range   Wrist extension    Wrist ulnar deviation    Wrist radial deviation    Wrist pronation    Wrist supination (+) pain end range    (Blank rows = not tested)  UPPER EXTREMITY MMT:  MMT Right eval Left eval  Shoulder flexion 4 4  Shoulder extension    Shoulder abduction 4 4  Shoulder adduction    Shoulder extension    Shoulder internal rotation    Shoulder external rotation    Middle trapezius    Lower trapezius    Elbow flexion 5 5  Elbow extension 5 5  Wrist flexion    Wrist extension    Wrist ulnar deviation    Wrist radial deviation    Wrist pronation 4 5  Wrist supination 4 (+) pain 5  Grip strength 23.3lb 21.6lb   (Blank rows = not tested)   LE MMT: 12/22/2021: Right hip strength of 4-/5, right hamstring strength of 4/5 Otherwise, WFL   CERVICAL SPECIAL TESTS:  Neck flexor muscle endurance test: not tested due to time limitations 12/22/21: Slump test: Positive  FUNCTIONAL TESTS:    TODAY'S TREATMENT:  DATE: 02/01/22 Pt seen for aquatic therapy today.  Treatment took place in water 3.25-4.5 ft in depth at the [facility]. Temp of water was 91.  Pt entered/exited the pool via stairs independently with single rail.   * walking forward / backward - cues for slow pace * side stepping with shoulder add/abdct; forward walking with horiz abdct ( thumbs up) * holding wall:  heel raises x 20 * without support:  hip circles x 10 with RLE, x 2 with LLE (increased pain in back and  LE, stopped) * suspended on yellow noodle  without UE support cycling (ncreased symptoms), suspended LE hip abdct/addct and hip flex/ext knees straight - multiple rounds  * suspended back float (yellow noodle under arms, blue noodle under legs) for decompression -   Pt shown decompression position along with gentle scap squeeze and head press .     Pt requires the buoyancy and hydrostatic pressure of water for support, and to offload joints by unweighting joint load by at least 50 % in navel deep water and by at least 75-80% in chest to neck deep water.  Viscosity of the water is needed for resistance of strengthening. Water current perturbations provides challenge to standing balance requiring increased core activation.  PATIENT EDUCATION:  Education details: aquatics progressions/ modifications Person educated: Patient Education method: Dance movement psychotherapist comprehension: verbalized understanding and returned demonstration  HOME EXERCISE PROGRAM: Need next visit  ASSESSMENT:  CLINICAL IMPRESSION: Gradual reduction of pain in RLE/ RUE while in the water (to 5/10 in LB, 6/10 in RUE).   Pt has increase in RLE radicular symptoms with R SLS, as well as hip flexion past 90/ lumbar flexion despite submerged 80%;  avoided this motions today.   She reports the most reduction of symptoms in UE and LE when suspended on yellow noodle in deeper water.  Goals are ongoing.   OBJECTIVE IMPAIRMENTS: decreased activity tolerance, decreased mobility, decreased ROM, decreased strength, impaired perceived functional ability, increased muscle spasms, impaired flexibility, impaired sensation, impaired UE functional use, postural dysfunction, and pain.   ACTIVITY LIMITATIONS: carrying, lifting, sleeping, toileting, dressing, reach over head, and caring for others  PARTICIPATION LIMITATIONS: cleaning, laundry, driving, shopping, occupation, and yard work  PERSONAL FACTORS: Behavior pattern,  Past/current experiences, Time since onset of injury/illness/exacerbation, and 3+ comorbidities: psoriatic arthritis, chronic LBP, carpal tunnel syndrome  are also affecting patient's functional outcome.   REHAB POTENTIAL: Good  CLINICAL DECISION MAKING: Unstable/unpredictable  EVALUATION COMPLEXITY: High   GOALS: Goals reviewed with patient? Yes  SHORT TERM GOALS: Target date: 03/29/2022   Pt will be independent with her initial HEP Baseline:  Goal status: INITIAL  2.  .  Pt will report at least a 30% improvement in LBP symptoms since starting PT. Baseline:  Goal status: INITIAL      LONG TERM GOALS: Target date: 03/29/2022  Pt will increase LBP FOTO to at least 45% and cervical FOTO to 55% in order to demonstrate improvements in functional mobility. Baseline:  Goal status: INITIAL  2.  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  Pt will have atleast 10lb improvement in grip strength bilaterally. Baseline: 23lb on Rt  Goal status: INITIAL  4.  Pt will have increase in active cervical ROM to atleast 45 deg bilaterally to improve her ability to check blind spots while driving.  Baseline:  Goal status: INITIAL  5.  Pt will report atleast 50% improvement in her neck pain from the start of PT.  Baseline:  Goal status: INITIAL    PLAN:  PT FREQUENCY: 2x/week  PT DURATION: 8 weeks  PLANNED INTERVENTIONS: Therapeutic exercises, Therapeutic activity, Neuromuscular re-education, Balance training, Gait training, Patient/Family education, Self Care, Joint mobilization, Joint manipulation, Aquatic Therapy, Dry Needling, Taping, Manual therapy, and Re-evaluation  PLAN FOR NEXT SESSION: update LBP/neck HEP; scap strength; cervical ROM   Mayer Camel, PTA 02/01/22 5:07 PM [facility] [address] [place], [place], [address] Phone: [phone]   Fax:  [phone]

## 2022-02-06 ENCOUNTER — Encounter: Payer: Self-pay | Admitting: Rehabilitative and Restorative Service Providers"

## 2022-02-06 ENCOUNTER — Ambulatory Visit: Payer: Managed Care, Other (non HMO) | Admitting: Rehabilitative and Restorative Service Providers"

## 2022-02-06 DIAGNOSIS — M6281 Muscle weakness (generalized): Secondary | ICD-10-CM

## 2022-02-06 DIAGNOSIS — M5459 Other low back pain: Secondary | ICD-10-CM

## 2022-02-06 DIAGNOSIS — M545 Low back pain, unspecified: Secondary | ICD-10-CM | POA: Diagnosis not present

## 2022-02-06 DIAGNOSIS — M542 Cervicalgia: Secondary | ICD-10-CM

## 2022-02-06 DIAGNOSIS — R252 Cramp and spasm: Secondary | ICD-10-CM

## 2022-02-06 NOTE — Patient Instructions (Signed)

## 2022-02-06 NOTE — Therapy (Signed)
OUTPATIENT PHYSICAL THERAPY TREATMENT NOTE   Patient Name: Stephanie [name] MRN: [id number] DOB:May 20, 1980, 41 y.o., female Today's Date: 02/06/2022   PT End of Session - 02/06/22 1502     Visit Number 3    Date for PT Re-Evaluation 03/20/22    Authorization Type Cign[employer]    Authorization - Visit Number 3    Authorization - Number of Visits 19    PT Start Time 1450    PT Stop Time 1530    PT Time Calculation (min) 40 min    Activity Tolerance Patient tolerated treatment well    Behavior During Therapy WFL for tasks assessed/performed              Past Medical History:  Diagnosis Date   Acid reflux    Anxiety    Arthritis    Asthma    Bipolar depression ([facility])    Carpal tunnel syndrome    Depression    Hypothyroid    IBS (irritable bowel syndrome)    Normal pregnancy, first 07/27/2011   Sinusitis    SVD (spontaneous vaginal delivery) 07/28/2011   SVD (spontaneous vaginal delivery) 07/28/2011   Past Surgical History:  Procedure Laterality Date   extraction of wisdom teeth     Patient Active Problem List   Diagnosis Date Noted   SVD (spontaneous vaginal delivery) 07/28/2011    PCP: [name], MD  REFERRING PROVIDER: [name], MD  REFERRING DIAG: Neck pain; back pain   THERAPY DIAG:  Cervicalgia  Other low back pain  Muscle weakness (generalized)  Cramp and spasm  Rationale for Evaluation and Treatment: Rehabilitation  ONSET DATE: back 12/05/21, neck  SUBJECTIVE:                                                                                                                                                                                                         SUBJECTIVE STATEMENT: Pt reports that she has been going through her storage unit still, so she is still having increased pain.    PERTINENT HISTORY:  Depression, anxiety, psoriatic arthritis, carpal tunnel syndrome, plantar fasciitis  PAIN:  Are you having pain? Yes: NPRS  scale:8/10 in lower back, 8/10 neck radiating into L arm to fingers Pain location: see above  Pain description: dull, constant Aggravating factors: cervical rotation Lt or Rt and weight bearing into RLE Relieving factors: avoiding a lot of neck movement  PRECAUTIONS: None  WEIGHT BEARING RESTRICTIONS: No  FALLS:  Has patient fallen in last 6 months? No  LIVING ENVIRONMENT: Lives with: lives with their  family Lives in: House/apartment Stairs: Yes: Internal: 1 steps; none Has following equipment at home: None  OCCUPATION: Runner, broadcasting/film/video at [employer]  PLOF: Independent  PATIENT GOALS: To be able to do daily activities without increased pain.    NEXT MD VISIT: Neurosurgeon: Dr Mikal Plane follow up after she gets EMG and CT scan of neck and back.  OBJECTIVE:   DIAGNOSTIC FINDINGS:  Lumbar MRI on 12/13/21: IMPRESSION: 1. No acute abnormality or evidence of cauda equina syndrome. 2. Progressive multilevel lumbar spondylosis as described above, worst at L5-S1 where there is new mild-to-moderate bilateral neuroforaminal stenosis.  PATIENT SURVEYS:  Eval FOTO: BACK 19% (Projected 45% by visit 13)   NECK 33% (projected 51 by visit 12)  SCREENING FOR RED FLAGS 12/22/21: Bowel or bladder incontinence: Yes: has history of incontinence, has worked with pelvic floor PT in the past Spinal tumors: No Cauda equina syndrome: No Compression fracture: No Abdominal aneurysm: No  COGNITION: Overall cognitive status: Within functional limits for tasks assessed  SENSATION: Reports occasional paraesthesias and burning throughout body at times  POSTURE: rounded shoulders and forward head  PALPATION: Muscle spasm and tenderness noted Rt upper trap   CERVICAL ROM: (+) pain with all movements  Active ROM A/PROM (deg) eval  Flexion   Extension 25  Right lateral flexion   Left lateral flexion   Right rotation 40  Left rotation 40   (Blank rows = not  tested)  UPPER EXTREMITY ROM:  Reach behind back: Rt to bra line (+) pain, Lt to inferior scapula Reach behind head: Rt C7 (+) pain, Lt T1  Active ROM Right eval Left eval  Shoulder flexion WNL WNL  Shoulder extension    Shoulder abduction    Shoulder adduction    Shoulder extension    Shoulder internal rotation    Shoulder external rotation    Elbow flexion    Elbow extension    Wrist flexion (+) pain end range   Wrist extension    Wrist ulnar deviation    Wrist radial deviation    Wrist pronation    Wrist supination (+) pain end range    (Blank rows = not tested)  UPPER EXTREMITY MMT:  MMT Right eval Left eval  Shoulder flexion 4 4  Shoulder extension    Shoulder abduction 4 4  Shoulder adduction    Shoulder extension    Shoulder internal rotation    Shoulder external rotation    Middle trapezius    Lower trapezius    Elbow flexion 5 5  Elbow extension 5 5  Wrist flexion    Wrist extension    Wrist ulnar deviation    Wrist radial deviation    Wrist pronation 4 5  Wrist supination 4 (+) pain 5  Grip strength 23.3lb 21.6lb   (Blank rows = not tested)   LE MMT: 12/22/2021: Right hip strength of 4-/5, right hamstring strength of 4/5 Otherwise, WFL   CERVICAL SPECIAL TESTS:  Neck flexor muscle endurance test: not tested due to time limitations 12/22/21: Slump test: Positive  FUNCTIONAL TESTS:  12/26/2021: 5 times sit to stand: 19.9 sec Timed up and go (TUG): 10.5 sec   TODAY'S TREATMENT:  DATE: 02/06/22 Nustep level 1 x6 min with PT present to discuss status 3 way seated green pball rollout 5x10 sec each Seated upper trap and levator stretch 2x20 sec bilat Seated shoulder ER and horizontal abduction with red tband 2x10 bilat Trigger Point Dry-Needling  Treatment instructions: Expect mild to moderate muscle soreness. S/S of  pneumothorax if dry needled over a lung field, and to seek immediate medical attention should they occur. Patient verbalized understanding of these instructions and education. Patient Consent Given: Yes Education handout provided: Yes Muscles treated: Right suboccipital, bilat upper traps, right rhomboids, right cervical multifidi, left lumbar multifidi Electrical stimulation performed: No Parameters: N/A Treatment response/outcome: utilized skilled palpation to locate and identify trigger points.  Able to palpate twitch response and muscle elongation   DATE: 02/01/22 Pt seen for aquatic therapy today.  Treatment took place in water 3.25-4.5 ft in depth at the [facility] pool. Temp of water was 91.  Pt entered/exited the pool via stairs independently with single rail.   * walking forward / backward - cues for slow pace * side stepping with shoulder add/abdct; forward walking with horiz abdct ( thumbs up) * holding wall:  heel raises x 20 * without support:  hip circles x 10 with RLE, x 2 with LLE (increased pain in back and LE, stopped) * suspended on yellow noodle  without UE support cycling (ncreased symptoms), suspended LE hip abdct/addct and hip flex/ext knees straight - multiple rounds  * suspended back float (yellow noodle under arms, blue noodle under legs) for decompression -   Pt shown decompression position along with gentle scap squeeze and head press .     Pt requires the buoyancy and hydrostatic pressure of water for support, and to offload joints by unweighting joint load by at least 50 % in navel deep water and by at least 75-80% in chest to neck deep water.  Viscosity of the water is needed for resistance of strengthening. Water current perturbations provides challenge to standing balance requiring increased core activation.  PATIENT EDUCATION:  Education details: aquatics progressions/ modifications Person educated: Patient Education method: Software engineer Education comprehension: verbalized understanding and returned demonstration  HOME EXERCISE PROGRAM: Access Code: [email or web address] URL: [email or web address] Date: 02/06/2022 Prepared by: Clydie Braun Jacobb Alen  Exercises - Sidelying Transversus Abdominis Bracing  - 1 x daily - 7 x weekly - 1 sets - 10 reps - 10 hold - Clamshell  - 1 x daily - 7 x weekly - 1 sets - 10 reps - Sidelying Reverse Clamshell  - 1 x daily - 7 x weekly - 1 sets - 10 reps - Supine Posterior Pelvic Tilt  - 1 x daily - 7 x weekly - 2 sets - 10 reps - Supine Piriformis Stretch with Foot on Ground  - 1 x daily - 7 x weekly - 1 sets - 2 reps - 20 sec hold - Supine Hip Adductor Stretch  - 1 x daily - 7 x weekly - 1 sets - 3 reps - 20 hold - Supine March with Elevation  - 1 x daily - 7 x weekly - 1 sets - 10 reps - Supine Alternating Knee Taps with Hands  - 1 x daily - 7 x weekly - 1 sets - 10 reps - Hip Flexor Stretch at Edge of Bed (Mirrored)  - 1 x daily - 7 x weekly - 1 sets - 3 reps - 20 hold - Bird Dog  - 1 x daily - 7 x weekly -  1 sets - 10 reps - Seated Upper Trapezius Stretch  - 1 x daily - 7 x weekly - 1 sets - 2 reps - 20 sec hold - Seated Levator Scapulae Stretch  - 1 x daily - 7 x weekly - 1 sets - 2 reps - 20 sec hold - Seated Piriformis Stretch  - 1 x daily - 7 x weekly - 1 sets - 3 reps - 20 hold - Seated Transversus Abdominis Bracing  - 1 x daily - 7 x weekly - 1 sets - 10 reps - Single Leg Stance with Support  - 1 x daily - 7 x weekly - 1 sets - 3 reps - 10 hold - Stride Stance Weight Shift  - 1 x daily - 7 x weekly - 1 sets - 15 reps - 3 hold - Forward Step Up  - 1 x daily - 7 x weekly - 3 sets - 10 reps - Side Lunge Adductor Stretch  - 1 x daily - 7 x weekly - 1 sets - 3 reps - 30 hold - Standing Isometric Hip Abduction with Ball on Wall  - 1 x daily - 7 x weekly - 1 sets - 5 reps - 5 hold - Standing Hip Hinge with Dowel  - 1 x daily - 7 x weekly - 1 sets - 10 reps - Standing Low Shoulder  Row with Anchored Resistance  - 1 x daily - 7 x weekly - 1 sets - 10 reps - Standing Shoulder Extension with Resistance  - 1 x daily - 7 x weekly - 1 sets - 10 reps - Standing Shoulder Horizontal Abduction with Resistance  - 1 x daily - 7 x weekly - 2 sets - 10 reps - Shoulder External Rotation and Scapular Retraction with Resistance  - 1 x daily - 7 x weekly - 2 sets - 10 reps - Standing Diagonal Shoulder Extension with Anchored Resistance  - 1 x daily - 7 x weekly - 1 sets - 10 reps - Half Dead Lift with Kettlebell  - 1 x daily - 7 x weekly - 1 sets - 10 reps - Half Kneeling Hip Flexor Stretch with 3-Way Reach  - 1 x daily - 7 x weekly - 1 sets - 10 reps - Half Kneeling Hip Flexor Stretch with Sidebend  - 1 x daily - 7 x weekly - 1 sets - 10 reps - Half-Kneeling Shoulder Horizontal Abduction with Resistance  - 1 x daily - 7 x weekly - 3 sets - 10 reps - Kneeling Diagonal Chop with Anchored Resistance  - 1 x daily - 7 x weekly - 3 sets - 10 reps - Lateral Step Up  - 1 x daily - 7 x weekly - 2 sets - 10 reps - Gastroc Stretch on Wall  - 1 x daily - 7 x weekly - 1 sets - 2 reps - 30 hold - Standing Soleus Stretch  - 1 x daily - 7 x weekly - 1 sets - 2 reps - 30 hold - Seated Plantar Fascia Mobilization with Small Ball  - 1 x daily - 7 x weekly - 1 sets - 60 hold - Seated Plantar Fascia Stretch  - 1 x daily - 7 x weekly - 3 sets - 10 reps  ASSESSMENT:  CLINICAL IMPRESSION: Stephanie [name] presents to skilled PT reporting that she will be driving out of town for the holiday and is looking forward to dry needling.  Pt reports a decrease in  radicular symptoms down her RUE following dry needling of right cervical multifidi.  Patient tolerates stretching and strengthening exercises well.  Pt continues to require skilled PT to progress towards goal related activities.  OBJECTIVE IMPAIRMENTS: decreased activity tolerance, decreased mobility, decreased ROM, decreased strength, impaired perceived functional  ability, increased muscle spasms, impaired flexibility, impaired sensation, impaired UE functional use, postural dysfunction, and pain.   ACTIVITY LIMITATIONS: carrying, lifting, sleeping, toileting, dressing, reach over head, and caring for others  PARTICIPATION LIMITATIONS: cleaning, laundry, driving, shopping, occupation, and yard work  PERSONAL FACTORS: Behavior pattern, Past/current experiences, Time since onset of injury/illness/exacerbation, and 3+ comorbidities: psoriatic arthritis, chronic LBP, carpal tunnel syndrome  are also affecting patient's functional outcome.   REHAB POTENTIAL: Good  CLINICAL DECISION MAKING: Unstable/unpredictable  EVALUATION COMPLEXITY: High   GOALS: Goals reviewed with patient? Yes  SHORT TERM GOALS: Target date: 02/17/2022  Pt will be independent with her initial HEP Baseline:  Goal status: IN PROGRESS  2.  .  Pt will report at least a 30% improvement in LBP symptoms since starting PT. Baseline:  Goal status: INITIAL      LONG TERM GOALS: Target date: 03/20/2022  Pt will increase LBP FOTO to at least 45% and cervical FOTO to 55% in order to demonstrate improvements in functional mobility. Baseline:  Goal status: INITIAL  2.  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  Pt will have atleast 10lb improvement in grip strength bilaterally. Baseline: 23lb on Rt  Goal status: INITIAL  4.  Pt will have increase in active cervical ROM to atleast 45 deg bilaterally to improve her ability to check blind spots while driving.  Baseline:  Goal status: INITIAL  5.  Pt will report atleast 50% improvement in her neck pain from the start of PT.  Baseline:  Goal status: INITIAL    PLAN:  PT FREQUENCY: 2x/week  PT DURATION: 8 weeks  PLANNED INTERVENTIONS: Therapeutic exercises, Therapeutic activity, Neuromuscular re-education, Balance  training, Gait training, Patient/Family education, Self Care, Joint mobilization, Joint manipulation, Aquatic Therapy, Dry Needling, Taping, Manual therapy, and Re-evaluation  PLAN FOR NEXT SESSION: core stability, strength; cervical ROM, manual/dry needling as indicated   Reather LaurenceShaunda Lash Matulich, PT 02/06/22 3:43 PM  [facility] [address], [address] [place], [place] [address] Phone # [phone] Fax [phone]

## 2022-02-12 ENCOUNTER — Encounter: Payer: Self-pay | Admitting: Rehabilitative and Restorative Service Providers"

## 2022-02-12 ENCOUNTER — Ambulatory Visit: Payer: Managed Care, Other (non HMO) | Admitting: Rehabilitative and Restorative Service Providers"

## 2022-02-12 DIAGNOSIS — M6281 Muscle weakness (generalized): Secondary | ICD-10-CM

## 2022-02-12 DIAGNOSIS — M542 Cervicalgia: Secondary | ICD-10-CM

## 2022-02-12 DIAGNOSIS — R252 Cramp and spasm: Secondary | ICD-10-CM

## 2022-02-12 DIAGNOSIS — M545 Low back pain, unspecified: Secondary | ICD-10-CM | POA: Diagnosis not present

## 2022-02-12 DIAGNOSIS — M5459 Other low back pain: Secondary | ICD-10-CM

## 2022-02-12 NOTE — Therapy (Signed)
OUTPATIENT PHYSICAL THERAPY TREATMENT NOTE   Patient Name: Stephanie [name] MRN: [id number] DOB:1980/09/18, 41 y.o., female Today's Date: 02/12/2022   PT End of Session - 02/12/22 0736     Visit Number 4    Date for PT Re-Evaluation 03/20/22    Authorization Type [employer]    Authorization - Visit Number 4    Authorization - Number of Visits 19    PT Start Time [phone]    PT Stop Time 0800    PT Time Calculation (min) 27 min    Activity Tolerance Patient tolerated treatment well    Behavior During Therapy [facility] for tasks assessed/performed              Past Medical History:  Diagnosis Date   Acid reflux    Anxiety    Arthritis    Asthma    Bipolar depression (HCC)    Carpal tunnel syndrome    Depression    Hypothyroid    IBS (irritable bowel syndrome)    Normal pregnancy, first 07/27/2011   Sinusitis    SVD (spontaneous vaginal delivery) 07/28/2011   SVD (spontaneous vaginal delivery) 07/28/2011   Past Surgical History:  Procedure Laterality Date   extraction of wisdom teeth     Patient Active Problem List   Diagnosis Date Noted   SVD (spontaneous vaginal delivery) 07/28/2011    PCP: [name], MD  REFERRING PROVIDER: [name], MD  REFERRING DIAG: Neck pain; back pain   THERAPY DIAG:  Cervicalgia  Other low back pain  Muscle weakness (generalized)  Cramp and spasm  Rationale for Evaluation and Treatment: Rehabilitation  ONSET DATE: back 12/05/21, neck  SUBJECTIVE:                                                                                                                                                                                                         SUBJECTIVE STATEMENT: Pt reports she tried to ambulate around the block over break and she had increased pain.  Pt reports that she set up an appointment with Dr Duanne Guess to speak about her increased constipation and her feet falling asleep and getting cold.  PERTINENT HISTORY:   Depression, anxiety, psoriatic arthritis, carpal tunnel syndrome, plantar fasciitis  PAIN:  Are you having pain? Yes: NPRS scale:6/10 in lower back, 6/10 neck radiating into L arm to fingers Pain location: see above  Pain description: dull, constant Aggravating factors: cervical rotation Lt or Rt and weight bearing into RLE Relieving factors: avoiding a lot of neck movement  PRECAUTIONS: None  WEIGHT BEARING  RESTRICTIONS: No  FALLS:  Has patient fallen in last 6 months? No  LIVING ENVIRONMENT: Lives with: lives with their family Lives in: House/apartment Stairs: Yes: Internal: 1 steps; none Has following equipment at home: None  OCCUPATION: Runner, broadcasting/film/video at [employer]  PLOF: Independent  PATIENT GOALS: To be able to do daily activities without increased pain.    NEXT MD VISIT: Neurosurgeon: Dr Mikal Plane follow up after she gets EMG and CT scan of neck and back.  OBJECTIVE:   DIAGNOSTIC FINDINGS:  Lumbar MRI on 12/13/21: IMPRESSION: 1. No acute abnormality or evidence of cauda equina syndrome. 2. Progressive multilevel lumbar spondylosis as described above, worst at L5-S1 where there is new mild-to-moderate bilateral neuroforaminal stenosis.  PATIENT SURVEYS:  Eval FOTO: BACK 19% (Projected 45% by visit 13)   NECK 33% (projected 51 by visit 12)  SCREENING FOR RED FLAGS 12/22/21: Bowel or bladder incontinence: Yes: has history of incontinence, has worked with pelvic floor PT in the past Spinal tumors: No Cauda equina syndrome: No Compression fracture: No Abdominal aneurysm: No  COGNITION: Overall cognitive status: Within functional limits for tasks assessed  SENSATION: Reports occasional paraesthesias and burning throughout body at times  POSTURE: rounded shoulders and forward head  PALPATION: Muscle spasm and tenderness noted Rt upper trap   CERVICAL ROM: (+) pain with all movements  Active ROM A/PROM (deg) eval  Flexion    Extension 25  Right lateral flexion   Left lateral flexion   Right rotation 40  Left rotation 40   (Blank rows = not tested)  UPPER EXTREMITY ROM:  Reach behind back: Rt to bra line (+) pain, Lt to inferior scapula Reach behind head: Rt C7 (+) pain, Lt T1  Active ROM Right eval Left eval  Shoulder flexion WNL WNL  Shoulder extension    Shoulder abduction    Shoulder adduction    Shoulder extension    Shoulder internal rotation    Shoulder external rotation    Elbow flexion    Elbow extension    Wrist flexion (+) pain end range   Wrist extension    Wrist ulnar deviation    Wrist radial deviation    Wrist pronation    Wrist supination (+) pain end range    (Blank rows = not tested)  UPPER EXTREMITY MMT:  MMT Right eval Left eval  Shoulder flexion 4 4  Shoulder extension    Shoulder abduction 4 4  Shoulder adduction    Shoulder extension    Shoulder internal rotation    Shoulder external rotation    Middle trapezius    Lower trapezius    Elbow flexion 5 5  Elbow extension 5 5  Wrist flexion    Wrist extension    Wrist ulnar deviation    Wrist radial deviation    Wrist pronation 4 5  Wrist supination 4 (+) pain 5  Grip strength 23.3lb 21.6lb   (Blank rows = not tested)   LE MMT: 12/22/2021: Right hip strength of 4-/5, right hamstring strength of 4/5 Otherwise, WFL   CERVICAL SPECIAL TESTS:  Neck flexor muscle endurance test: not tested due to time limitations 12/22/21: Slump test: Positive  FUNCTIONAL TESTS:  12/26/2021: 5 times sit to stand: 19.9 sec Timed up and go (TUG): 10.5 sec   TODAY'S TREATMENT:  DATE: 02/12/2022 Nustep level 4 x5 min with PT present to discuss status Seated upper trap and levator stretch 2x20 sec bilat 3 way seated green pball rollout 5x10 sec each Seated shoulder ER and horizontal abduction  with red tband 2x10 bilat Standing hip abduction 2x10 bilat Standing back extension 2x10 Trigger Point Dry-Needling  Treatment instructions: Expect mild to moderate muscle soreness. S/S of pneumothorax if dry needled over a lung field, and to seek immediate medical attention should they occur. Patient verbalized understanding of these instructions and education. Patient Consent Given: Yes Education handout provided: Yes Muscles treated: Right suboccipital, Right upper traps, right cervical multifidi Electrical stimulation performed: No Parameters: N/A Treatment response/outcome: utilized skilled palpation to locate and identify trigger points.  Able to palpate twitch response and muscle elongation   DATE: 02/06/22 Nustep level 1 x6 min with PT present to discuss status 3 way seated green pball rollout 5x10 sec each Seated upper trap and levator stretch 2x20 sec bilat Seated shoulder ER and horizontal abduction with red tband 2x10 bilat Trigger Point Dry-Needling  Treatment instructions: Expect mild to moderate muscle soreness. S/S of pneumothorax if dry needled over a lung field, and to seek immediate medical attention should they occur. Patient verbalized understanding of these instructions and education. Patient Consent Given: Yes Education handout provided: Yes Muscles treated: Right suboccipital, bilat upper traps, right rhomboids, right cervical multifidi, left lumbar multifidi Electrical stimulation performed: No Parameters: N/A Treatment response/outcome: utilized skilled palpation to locate and identify trigger points.  Able to palpate twitch response and muscle elongation   DATE: 02/01/22 Pt seen for aquatic therapy today.  Treatment took place in water 3.25-4.5 ft in depth at the [facility] pool. Temp of water was 91.  Pt entered/exited the pool via stairs independently with single rail.   * walking forward / backward - cues for slow pace * side stepping with  shoulder add/abdct; forward walking with horiz abdct ( thumbs up) * holding wall:  heel raises x 20 * without support:  hip circles x 10 with RLE, x 2 with LLE (increased pain in back and LE, stopped) * suspended on yellow noodle  without UE support cycling (ncreased symptoms), suspended LE hip abdct/addct and hip flex/ext knees straight - multiple rounds  * suspended back float (yellow noodle under arms, blue noodle under legs) for decompression -   Pt shown decompression position along with gentle scap squeeze and head press .     Pt requires the buoyancy and hydrostatic pressure of water for support, and to offload joints by unweighting joint load by at least 50 % in navel deep water and by at least 75-80% in chest to neck deep water.  Viscosity of the water is needed for resistance of strengthening. Water current perturbations provides challenge to standing balance requiring increased core activation.  PATIENT EDUCATION:  Education details: aquatics progressions/ modifications Person educated: Patient Education method: Medical illustrator Education comprehension: verbalized understanding and returned demonstration  HOME EXERCISE PROGRAM: Access Code: [email or web address] URL: [email or web address] Date: 02/06/2022 Prepared by: Clydie Braun Evangelos Paulino  Exercises - Sidelying Transversus Abdominis Bracing  - 1 x daily - 7 x weekly - 1 sets - 10 reps - 10 hold - Clamshell  - 1 x daily - 7 x weekly - 1 sets - 10 reps - Sidelying Reverse Clamshell  - 1 x daily - 7 x weekly - 1 sets - 10 reps - Supine Posterior Pelvic Tilt  - 1 x daily - 7 x weekly -  2 sets - 10 reps - Supine Piriformis Stretch with Foot on Ground  - 1 x daily - 7 x weekly - 1 sets - 2 reps - 20 sec hold - Supine Hip Adductor Stretch  - 1 x daily - 7 x weekly - 1 sets - 3 reps - 20 hold - Supine March with Elevation  - 1 x daily - 7 x weekly - 1 sets - 10 reps - Supine Alternating Knee Taps with Hands  - 1 x daily - 7 x  weekly - 1 sets - 10 reps - Hip Flexor Stretch at Edge of Bed (Mirrored)  - 1 x daily - 7 x weekly - 1 sets - 3 reps - 20 hold - Bird Dog  - 1 x daily - 7 x weekly - 1 sets - 10 reps - Seated Upper Trapezius Stretch  - 1 x daily - 7 x weekly - 1 sets - 2 reps - 20 sec hold - Seated Levator Scapulae Stretch  - 1 x daily - 7 x weekly - 1 sets - 2 reps - 20 sec hold - Seated Piriformis Stretch  - 1 x daily - 7 x weekly - 1 sets - 3 reps - 20 hold - Seated Transversus Abdominis Bracing  - 1 x daily - 7 x weekly - 1 sets - 10 reps - Single Leg Stance with Support  - 1 x daily - 7 x weekly - 1 sets - 3 reps - 10 hold - Stride Stance Weight Shift  - 1 x daily - 7 x weekly - 1 sets - 15 reps - 3 hold - Forward Step Up  - 1 x daily - 7 x weekly - 3 sets - 10 reps - Side Lunge Adductor Stretch  - 1 x daily - 7 x weekly - 1 sets - 3 reps - 30 hold - Standing Isometric Hip Abduction with Ball on Wall  - 1 x daily - 7 x weekly - 1 sets - 5 reps - 5 hold - Standing Hip Hinge with Dowel  - 1 x daily - 7 x weekly - 1 sets - 10 reps - Standing Low Shoulder Row with Anchored Resistance  - 1 x daily - 7 x weekly - 1 sets - 10 reps - Standing Shoulder Extension with Resistance  - 1 x daily - 7 x weekly - 1 sets - 10 reps - Standing Shoulder Horizontal Abduction with Resistance  - 1 x daily - 7 x weekly - 2 sets - 10 reps - Shoulder External Rotation and Scapular Retraction with Resistance  - 1 x daily - 7 x weekly - 2 sets - 10 reps - Standing Diagonal Shoulder Extension with Anchored Resistance  - 1 x daily - 7 x weekly - 1 sets - 10 reps - Half Dead Lift with Kettlebell  - 1 x daily - 7 x weekly - 1 sets - 10 reps - Half Kneeling Hip Flexor Stretch with 3-Way Reach  - 1 x daily - 7 x weekly - 1 sets - 10 reps - Half Kneeling Hip Flexor Stretch with Sidebend  - 1 x daily - 7 x weekly - 1 sets - 10 reps - Half-Kneeling Shoulder Horizontal Abduction with Resistance  - 1 x daily - 7 x weekly - 3 sets - 10 reps -  Kneeling Diagonal Chop with Anchored Resistance  - 1 x daily - 7 x weekly - 3 sets - 10 reps - Lateral Step Up  -  1 x daily - 7 x weekly - 2 sets - 10 reps - Gastroc Stretch on Wall  - 1 x daily - 7 x weekly - 1 sets - 2 reps - 30 hold - Standing Soleus Stretch  - 1 x daily - 7 x weekly - 1 sets - 2 reps - 30 hold - Seated Plantar Fascia Mobilization with Small Ball  - 1 x daily - 7 x weekly - 1 sets - 60 hold - Seated Plantar Fascia Stretch  - 1 x daily - 7 x weekly - 3 sets - 10 reps  ASSESSMENT:  CLINICAL IMPRESSION: Stephanie [name] presents to skilled PT overall with decreased pain than last visit.  Patient continues to have a good response to dry needling with decreased pain reported following.  Pt to have another appointment with PT scheduled for tomorrow, could do dry needling for lumbar region on Tuesday, if needed, as this was not performed during today's session.  OBJECTIVE IMPAIRMENTS: decreased activity tolerance, decreased mobility, decreased ROM, decreased strength, impaired perceived functional ability, increased muscle spasms, impaired flexibility, impaired sensation, impaired UE functional use, postural dysfunction, and pain.   ACTIVITY LIMITATIONS: carrying, lifting, sleeping, toileting, dressing, reach over head, and caring for others  PARTICIPATION LIMITATIONS: cleaning, laundry, driving, shopping, occupation, and yard work  PERSONAL FACTORS: Behavior pattern, Past/current experiences, Time since onset of injury/illness/exacerbation, and 3+ comorbidities: psoriatic arthritis, chronic LBP, carpal tunnel syndrome  are also affecting patient's functional outcome.   REHAB POTENTIAL: Good  CLINICAL DECISION MAKING: Unstable/unpredictable  EVALUATION COMPLEXITY: High   GOALS: Goals reviewed with patient? Yes  SHORT TERM GOALS: Target date: 02/17/2022  Pt will be independent with her initial HEP Baseline:  Goal status: IN PROGRESS  2.  .  Pt will report at least a 30%  improvement in LBP symptoms since starting PT. Baseline:  Goal status: INITIAL      LONG TERM GOALS: Target date: 03/20/2022  Pt will increase LBP FOTO to at least 45% and cervical FOTO to 55% in order to demonstrate improvements in functional mobility. Baseline:  Goal status: INITIAL  2.  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  Pt will have atleast 10lb improvement in grip strength bilaterally. Baseline: 23lb on Rt  Goal status: INITIAL  4.  Pt will have increase in active cervical ROM to atleast 45 deg bilaterally to improve her ability to check blind spots while driving.  Baseline:  Goal status: INITIAL  5.  Pt will report atleast 50% improvement in her neck pain from the start of PT.  Baseline:  Goal status: INITIAL    PLAN:  PT FREQUENCY: 2x/week  PT DURATION: 8 weeks  PLANNED INTERVENTIONS: Therapeutic exercises, Therapeutic activity, Neuromuscular re-education, Balance training, Gait training, Patient/Family education, Self Care, Joint mobilization, Joint manipulation, Aquatic Therapy, Dry Needling, Taping, Manual therapy, and Re-evaluation  PLAN FOR NEXT SESSION: core stability, strength; cervical ROM, manual/dry needling as indicated   Reather Laurence, PT 02/12/22 8:13 AM  [facility] [address] [place], [place] [address] Phone # [phone] Fax [phone]

## 2022-02-13 ENCOUNTER — Ambulatory Visit (INDEPENDENT_AMBULATORY_CARE_PROVIDER_SITE_OTHER): Payer: Managed Care, Other (non HMO) | Admitting: Family Medicine

## 2022-02-13 ENCOUNTER — Ambulatory Visit: Payer: Managed Care, Other (non HMO)

## 2022-02-13 ENCOUNTER — Encounter: Payer: Self-pay | Admitting: Family Medicine

## 2022-02-13 VITALS — Ht 63.0 in | Wt 182.2 lb

## 2022-02-13 DIAGNOSIS — M5459 Other low back pain: Secondary | ICD-10-CM

## 2022-02-13 DIAGNOSIS — M542 Cervicalgia: Secondary | ICD-10-CM

## 2022-02-13 DIAGNOSIS — M6281 Muscle weakness (generalized): Secondary | ICD-10-CM

## 2022-02-13 DIAGNOSIS — M545 Low back pain, unspecified: Secondary | ICD-10-CM | POA: Diagnosis not present

## 2022-02-13 DIAGNOSIS — Z713 Dietary counseling and surveillance: Secondary | ICD-10-CM

## 2022-02-13 DIAGNOSIS — R252 Cramp and spasm: Secondary | ICD-10-CM

## 2022-02-13 NOTE — Therapy (Signed)
OUTPATIENT PHYSICAL THERAPY TREATMENT NOTE   Patient Name: Stephanie [name] MRN: [id number] DOB:October 02, 1980, 41 y.o., female Today's Date: 02/13/2022   PT End of Session - 02/13/22 1238     Visit Number 5    Date for PT Re-Evaluation 03/20/22    Authorization Type [employer]    Authorization - Number of Visits 19    PT Start Time 1235    PT Stop Time 1315    PT Time Calculation (min) 40 min    Activity Tolerance Patient tolerated treatment well    Behavior During Therapy WFL for tasks assessed/performed              Past Medical History:  Diagnosis Date   Acid reflux    Anxiety    Arthritis    Asthma    Bipolar depression (HCC)    Carpal tunnel syndrome    Depression    Hypothyroid    IBS (irritable bowel syndrome)    Normal pregnancy, first 07/27/2011   Sinusitis    SVD (spontaneous vaginal delivery) 07/28/2011   SVD (spontaneous vaginal delivery) 07/28/2011   Past Surgical History:  Procedure Laterality Date   extraction of wisdom teeth     Patient Active Problem List   Diagnosis Date Noted   SVD (spontaneous vaginal delivery) 07/28/2011    PCP: [name], MD  REFERRING PROVIDER: [name], MD  REFERRING DIAG: Neck pain; back pain   THERAPY DIAG:  Cervicalgia  Other low back pain  Muscle weakness (generalized)  Cramp and spasm  Rationale for Evaluation and Treatment: Rehabilitation  ONSET DATE: back 12/05/21, neck  SUBJECTIVE:                                                                                                                                                                                                         SUBJECTIVE STATEMENT: Pt reports she continues to have right LE radicular symptoms.  She also confirms continued bowel and bladder issues.  She is due to have NCV soon.    PERTINENT HISTORY:  Depression, anxiety, psoriatic arthritis, carpal tunnel syndrome, plantar fasciitis  PAIN:  Are you having pain? Yes: NPRS  scale:6/10 in lower back, 6/10 neck radiating into L arm to fingers Pain location: see above  Pain description: dull, constant Aggravating factors: cervical rotation Lt or Rt and weight bearing into RLE Relieving factors: avoiding a lot of neck movement  PRECAUTIONS: None  WEIGHT BEARING RESTRICTIONS: No  FALLS:  Has patient fallen in last 6 months? No  LIVING ENVIRONMENT: Lives with: lives with their  family Lives in: House/apartment Stairs: Yes: Internal: 1 steps; none Has following equipment at home: None  OCCUPATION: Runner, broadcasting/film/video at [employer]  PLOF: Independent  PATIENT GOALS: To be able to do daily activities without increased pain.    NEXT MD VISIT: Neurosurgeon: Dr Mikal Plane follow up after she gets EMG and CT scan of neck and back.  OBJECTIVE:   DIAGNOSTIC FINDINGS:  Lumbar MRI on 12/13/21: IMPRESSION: 1. No acute abnormality or evidence of cauda equina syndrome. 2. Progressive multilevel lumbar spondylosis as described above, worst at L5-S1 where there is new mild-to-moderate bilateral neuroforaminal stenosis.  PATIENT SURVEYS:  Eval FOTO: BACK 19% (Projected 45% by visit 13)   NECK 33% (projected 51 by visit 12)  SCREENING FOR RED FLAGS 12/22/21: Bowel or bladder incontinence: Yes: has history of incontinence, has worked with pelvic floor PT in the past Spinal tumors: No Cauda equina syndrome: No Compression fracture: No Abdominal aneurysm: No  COGNITION: Overall cognitive status: Within functional limits for tasks assessed  SENSATION: Reports occasional paraesthesias and burning throughout body at times  POSTURE: rounded shoulders and forward head  PALPATION: Muscle spasm and tenderness noted Rt upper trap   CERVICAL ROM: (+) pain with all movements  Active ROM A/PROM (deg) eval  Flexion   Extension 25  Right lateral flexion   Left lateral flexion   Right rotation 40  Left rotation 40   (Blank rows = not  tested)  UPPER EXTREMITY ROM:  Reach behind back: Rt to bra line (+) pain, Lt to inferior scapula Reach behind head: Rt C7 (+) pain, Lt T1  Active ROM Right eval Left eval  Shoulder flexion WNL WNL  Shoulder extension    Shoulder abduction    Shoulder adduction    Shoulder extension    Shoulder internal rotation    Shoulder external rotation    Elbow flexion    Elbow extension    Wrist flexion (+) pain end range   Wrist extension    Wrist ulnar deviation    Wrist radial deviation    Wrist pronation    Wrist supination (+) pain end range    (Blank rows = not tested)  UPPER EXTREMITY MMT:  MMT Right eval Left eval  Shoulder flexion 4 4  Shoulder extension    Shoulder abduction 4 4  Shoulder adduction    Shoulder extension    Shoulder internal rotation    Shoulder external rotation    Middle trapezius    Lower trapezius    Elbow flexion 5 5  Elbow extension 5 5  Wrist flexion    Wrist extension    Wrist ulnar deviation    Wrist radial deviation    Wrist pronation 4 5  Wrist supination 4 (+) pain 5  Grip strength 23.3lb 21.6lb   (Blank rows = not tested)   LE MMT: 12/22/2021: Right hip strength of 4-/5, right hamstring strength of 4/5 Otherwise, WFL   CERVICAL SPECIAL TESTS:  Neck flexor muscle endurance test: not tested due to time limitations 12/22/21: Slump test: Positive  FUNCTIONAL TESTS:  12/26/2021: 5 times sit to stand: 19.9 sec Timed up and go (TUG): 10.5 sec   TODAY'S TREATMENT:  DATE: 02/13/2022 Discussed response to yesterday's treatment Attempted McKenzie extension due to patients radicular symptoms triggered by flexion activities: patient was symptoms free in prone lying but had increased leg symptoms with prone on elbows (had patient switch to supine) Hook lying PPT x 20 Isometric hip adduction x 10 hold 5 sec  each Isometric hip abduction x 10 hold 5 sec each Hook lying clam with yellow loop x 20 Side lying clam with yellow loop x 20 each side Side lying hip abduction x 10 each LE Supine SLR x 10 each LE.  DATE: 02/12/2022 Nustep level 4 x5 min with PT present to discuss status Seated upper trap and levator stretch 2x20 sec bilat 3 way seated green pball rollout 5x10 sec each Seated shoulder ER and horizontal abduction with red tband 2x10 bilat Standing hip abduction 2x10 bilat Standing back extension 2x10 Trigger Point Dry-Needling  Treatment instructions: Expect mild to moderate muscle soreness. S/S of pneumothorax if dry needled over a lung field, and to seek immediate medical attention should they occur. Patient verbalized understanding of these instructions and education. Patient Consent Given: Yes Education handout provided: Yes Muscles treated: Right suboccipital, Right upper traps, right cervical multifidi Electrical stimulation performed: No Parameters: N/A Treatment response/outcome: utilized skilled palpation to locate and identify trigger points.  Able to palpate twitch response and muscle elongation   DATE: 02/06/22 Nustep level 1 x6 min with PT present to discuss status 3 way seated green pball rollout 5x10 sec each Seated upper trap and levator stretch 2x20 sec bilat Seated shoulder ER and horizontal abduction with red tband 2x10 bilat Trigger Point Dry-Needling  Treatment instructions: Expect mild to moderate muscle soreness. S/S of pneumothorax if dry needled over a lung field, and to seek immediate medical attention should they occur. Patient verbalized understanding of these instructions and education. Patient Consent Given: Yes Education handout provided: Yes Muscles treated: Right suboccipital, bilat upper traps, right rhomboids, right cervical multifidi, left lumbar multifidi Electrical stimulation performed: No Parameters: N/A Treatment response/outcome: utilized  skilled palpation to locate and identify trigger points.  Able to palpate twitch response and muscle elongation    PATIENT EDUCATION:  Education details: aquatics progressions/ modifications Person educated: Patient Education method: Customer service manager Education comprehension: verbalized understanding and returned demonstration  HOME EXERCISE PROGRAM: Access Code: [email or web address] URL: [email or web address] Date: 02/13/2022 Prepared by: Candyce Churn  Exercises - Sidelying Transversus Abdominis Bracing  - 1 x daily - 7 x weekly - 1 sets - 10 reps - 10 hold - Clamshell  - 1 x daily - 7 x weekly - 1 sets - 10 reps - Sidelying Reverse Clamshell  - 1 x daily - 7 x weekly - 1 sets - 10 reps - Supine Posterior Pelvic Tilt  - 1 x daily - 7 x weekly - 2 sets - 10 reps - Supine Piriformis Stretch with Foot on Ground  - 1 x daily - 7 x weekly - 1 sets - 2 reps - 20 sec hold - Supine Hip Adductor Stretch  - 1 x daily - 7 x weekly - 1 sets - 3 reps - 20 hold - Supine March with Elevation  - 1 x daily - 7 x weekly - 1 sets - 10 reps - Supine Alternating Knee Taps with Hands  - 1 x daily - 7 x weekly - 1 sets - 10 reps - Hip Flexor Stretch at Edge of Bed (Mirrored)  - 1 x daily - 7 x weekly - 1 sets -  3 reps - 20 hold - Bird Dog  - 1 x daily - 7 x weekly - 1 sets - 10 reps - Seated Upper Trapezius Stretch  - 1 x daily - 7 x weekly - 1 sets - 2 reps - 20 sec hold - Seated Levator Scapulae Stretch  - 1 x daily - 7 x weekly - 1 sets - 2 reps - 20 sec hold - Seated Piriformis Stretch  - 1 x daily - 7 x weekly - 1 sets - 3 reps - 20 hold - Seated Transversus Abdominis Bracing  - 1 x daily - 7 x weekly - 1 sets - 10 reps - Single Leg Stance with Support  - 1 x daily - 7 x weekly - 1 sets - 3 reps - 10 hold - Stride Stance Weight Shift  - 1 x daily - 7 x weekly - 1 sets - 15 reps - 3 hold - Forward Step Up  - 1 x daily - 7 x weekly - 3 sets - 10 reps - Side Lunge Adductor Stretch  - 1  x daily - 7 x weekly - 1 sets - 3 reps - 30 hold - Standing Isometric Hip Abduction with Ball on Wall  - 1 x daily - 7 x weekly - 1 sets - 5 reps - 5 hold - Standing Hip Hinge with Dowel  - 1 x daily - 7 x weekly - 1 sets - 10 reps - Standing Low Shoulder Row with Anchored Resistance  - 1 x daily - 7 x weekly - 1 sets - 10 reps - Standing Shoulder Extension with Resistance  - 1 x daily - 7 x weekly - 1 sets - 10 reps - Standing Shoulder Horizontal Abduction with Resistance  - 1 x daily - 7 x weekly - 2 sets - 10 reps - Shoulder External Rotation and Scapular Retraction with Resistance  - 1 x daily - 7 x weekly - 2 sets - 10 reps - Standing Diagonal Shoulder Extension with Anchored Resistance  - 1 x daily - 7 x weekly - 1 sets - 10 reps - Half Dead Lift with Kettlebell  - 1 x daily - 7 x weekly - 1 sets - 10 reps - Half Kneeling Hip Flexor Stretch with 3-Way Reach  - 1 x daily - 7 x weekly - 1 sets - 10 reps - Half Kneeling Hip Flexor Stretch with Sidebend  - 1 x daily - 7 x weekly - 1 sets - 10 reps - Half-Kneeling Shoulder Horizontal Abduction with Resistance  - 1 x daily - 7 x weekly - 3 sets - 10 reps - Kneeling Diagonal Chop with Anchored Resistance  - 1 x daily - 7 x weekly - 3 sets - 10 reps - Lateral Step Up  - 1 x daily - 7 x weekly - 2 sets - 10 reps - Gastroc Stretch on Wall  - 1 x daily - 7 x weekly - 1 sets - 2 reps - 30 hold - Standing Soleus Stretch  - 1 x daily - 7 x weekly - 1 sets - 2 reps - 30 hold - Seated Plantar Fascia Mobilization with Small Ball  - 1 x daily - 7 x weekly - 1 sets - 60 hold - Seated Plantar Fascia Stretch  - 1 x daily - 7 x weekly - 3 sets - 10 reps - Supine Hip Adduction Isometric with Ball  - 1 x daily - 7 x weekly -  1 sets - 10 reps - 5 sec hold - Hooklying Isometric Clamshell  - 1 x daily - 7 x weekly - 1 sets - 10 reps - 5 sec hold ASSESSMENT:  CLINICAL IMPRESSION: Stephanie [name] is progressing slowly.  She has multiple orthopedic issues.  She did not  respond well to McKenzie extension.  She had no leg pain on prone lying but symptoms returned once in prone on elbows position.  She is compliant and well motivated, she would benefit from continued skilled PT to address left hip instability and bilateral LE weakness along with neck issues.    OBJECTIVE IMPAIRMENTS: decreased activity tolerance, decreased mobility, decreased ROM, decreased strength, impaired perceived functional ability, increased muscle spasms, impaired flexibility, impaired sensation, impaired UE functional use, postural dysfunction, and pain.   ACTIVITY LIMITATIONS: carrying, lifting, sleeping, toileting, dressing, reach over head, and caring for others  PARTICIPATION LIMITATIONS: cleaning, laundry, driving, shopping, occupation, and yard work  PERSONAL FACTORS: Behavior pattern, Past/current experiences, Time since onset of injury/illness/exacerbation, and 3+ comorbidities: psoriatic arthritis, chronic LBP, carpal tunnel syndrome  are also affecting patient's functional outcome.   REHAB POTENTIAL: Good  CLINICAL DECISION MAKING: Unstable/unpredictable  EVALUATION COMPLEXITY: High   GOALS: Goals reviewed with patient? Yes  SHORT TERM GOALS: Target date: 02/17/2022  Pt will be independent with her initial HEP Baseline:  Goal status: IN PROGRESS  2.  .  Pt will report at least a 30% improvement in LBP symptoms since starting PT. Baseline:  Goal status: INITIAL      LONG TERM GOALS: Target date: 03/20/2022  Pt will increase LBP FOTO to at least 45% and cervical FOTO to 55% in order to demonstrate improvements in functional mobility. Baseline:  Goal status: INITIAL  2.  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  Pt will have atleast 10lb improvement in grip strength bilaterally. Baseline: 23lb on Rt  Goal status: INITIAL  4.  Pt will have increase in  active cervical ROM to atleast 45 deg bilaterally to improve her ability to check blind spots while driving.  Baseline:  Goal status: INITIAL  5.  Pt will report atleast 50% improvement in her neck pain from the start of PT.  Baseline:  Goal status: INITIAL    PLAN:  PT FREQUENCY: 2x/week  PT DURATION: 8 weeks  PLANNED INTERVENTIONS: Therapeutic exercises, Therapeutic activity, Neuromuscular re-education, Balance training, Gait training, Patient/Family education, Self Care, Joint mobilization, Joint manipulation, Aquatic Therapy, Dry Needling, Taping, Manual therapy, and Re-evaluation  PLAN FOR NEXT SESSION: core stability, strength; cervical ROM, manual/dry needling as indicated   Julienne Vogler B. Miela Desjardin, PT 02/13/22 11:23 PM  [facility] [address] [place], [place] [address] Phone # [phone] Fax [phone]

## 2022-02-13 NOTE — Patient Instructions (Addendum)
Constipation:  Start with 1 teaspoon per day, mixed in yogurt or cereal.  Slowly and progressively increase up to 1 heaping tbsp of flax seed meal/day.  (Keep in the refrigerator.)   Be sure to keep up a good fluid intake as you increase fiber.     Goals:  1. Include vegetables in at least 10 meals per week.      - If you use canned soup sometimes, first microwave some vegetables, add the soup, then re-heat.   2. Do at least 10 minutes of home PT exercises 6 X wk.        (And on alternate Sundays: Tai Chi and water exercise.)    3. Text your sponsor daily: a review of the day; how you feel it went with respect to achieving balance between needs of yourself and others.    Document progress on above goals using whatever method works best for you.     Follow-up in-office appt on Monday, January 15 at 3 PM.

## 2022-02-13 NOTE — Progress Notes (Signed)
Medical Nutrition Therapy PCP Rachell Cipro, MD [facility]rap[facility] Chucky May, MD Appt start time: 1600 end time: 1700 (1 hour) Primary concerns today: Referred by Rachell Cipro, MD for MNT related to preDM (A1c 5.7 on 09/05/20), obesity, HLD, and eating D/O.  Diagnoses also include bipolar, hypothyroid, and ex-induced asthma.   Relevant history/background: At age 41, in response to a chaotic home and her parents' divorce, Stephanie [name] started binge-eating sugar.  By 3rd grade, she started restricting, which worsened at puberty.  At age 72, anorexia was extreme, and she lost 25 lb in a month.  She started medication for bipolar D/O before age 56, and then started binge-eating sugary foods again and gained a lot of weight.  She lost to 150s in early 27s, before getting pregnant with her daughter.  Lost pregnancy weight using [employer] again after the birth of her daughter, and went through a manic phase where weight loss was maintained.  She started seeing [facility]) for body dysmorphia in Sept 2021, after which she reintroduced sugar to her diet.  With a family hx of diabetes, she had avoided sugar for the previous 20 years, but last fall's sugar "allowance" led to frequent intake of sugary foods and weight gain.  Stephanie [name] wants to be able to maintain moderation with respect to diet, while also reducing her DM risk and stabilizing weight and appetite.  Prior to getting her own therapy re. body image, Stephanie [name]'s 9-YO daughter started seeing a therapist for concerns about disordered eating, but is doing pretty well right now.  Stephanie [name]'s mom has her own disordered eating, and poor appetite control.  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  Currently tracking food intake with [employer] app.  She lives with her 9-YO daughter and husband.   Assessment:   Stephanie [name] has been going to PT for her back pain, usually 2 X wk.  She is able to do some limited walking.   She started taking [place] about 8 wks ago.  Is taking Cyclobenzaprine 10 mg every other day (taken daily caused constipation).  Has neuro imaging appt scheduled to further explore back problems.   She has continued to track food intake daily, and is in contact daily with her accountability partner.  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  She is sleeping ~7 1/2 hrs most nights; muscle relaxers have been helpful.     Weight: 182.2 lb. (186.8 lb on 12/21/21). (Ht is 63".)  This is ~28-lb weight loss since April 2023.   Usual eating pattern:  Most days getting 3 meals and 2 snacks.  Vegetable intake:  Usually getting in 7-12 meals/week.   Usual physical activity:  PT water exercise at [place] 1 X wk; [facility] 1-2 X wk, and ~10 minutes of home PT exercises ~6 X wk.  (Hopes to do [employer] at least sporadically.)   Weekly meal planning: Doing regularly (Saturdays).   Weekday lunch prep:  daily; packing the night before: Grk yogurt with frzn cherries & maple syrup, baked potato with ranch, or 1 c mac&chs +/- 1/2 c churro corn puffs (3 g sugar, 140 kcal).   Sweets:  Usually 3~5 per week.   Journaling (needs):  Writing either a gratitude list or ways she has practiced self-care.  Has had a few friend activities since last nutrition appt.    Frequent temptations:  Thanksgiving was challenging, but she felt she made pretty good choices.  Anticipates a lot of Christmas sweets that parents and other teachers are likely to share.    24-hr recall:  (Up at 5:30 AM) B (6:30 AM)-  1 c Cheerios, 1 c sk Lactaid milk, water Snk (10 AM)-  4 oz Grk yogurt with 7-8 frzn cherries & 1 tsp maple syrup L (12 PM)-  1 slc chx alfredo pizza, water Snk (1 PM)-  1 blueberry fig bar, water Snk ( PM)-   D (6:30 PM)-  1 1/4 c chx ravioli, olive oil sauce, 2 c cabbage salad, 1 tsp drsng, water Snk (7:30)-  1/2 c low-fat ice cream Typical  day? No. Usually eats ice cream only ~1 X wk.     Nutritional Diagnosis: Continued progress on NB-2.1 Physical inactivity as related to energy intake as evidenced by regular participation in PT.    Handouts given during visit include: After-Visit Summary (AVS) Goals Sheet (revised)   Monitoring/Evaluation:  Dietary intake, exercise, and body weight in 8 week(s).

## 2022-02-15 ENCOUNTER — Ambulatory Visit: Payer: Managed Care, Other (non HMO) | Admitting: Family Medicine

## 2022-02-27 ENCOUNTER — Other Ambulatory Visit: Payer: Self-pay | Admitting: Nurse Practitioner

## 2022-02-27 DIAGNOSIS — R159 Full incontinence of feces: Secondary | ICD-10-CM

## 2022-02-27 DIAGNOSIS — R32 Unspecified urinary incontinence: Secondary | ICD-10-CM

## 2022-03-01 ENCOUNTER — Ambulatory Visit (HOSPITAL_BASED_OUTPATIENT_CLINIC_OR_DEPARTMENT_OTHER): Payer: Managed Care, Other (non HMO) | Attending: Physical Medicine and Rehabilitation | Admitting: Physical Therapy

## 2022-03-01 DIAGNOSIS — M6281 Muscle weakness (generalized): Secondary | ICD-10-CM | POA: Diagnosis present

## 2022-03-01 DIAGNOSIS — R252 Cramp and spasm: Secondary | ICD-10-CM

## 2022-03-01 DIAGNOSIS — M542 Cervicalgia: Secondary | ICD-10-CM

## 2022-03-01 DIAGNOSIS — M5459 Other low back pain: Secondary | ICD-10-CM | POA: Diagnosis present

## 2022-03-01 NOTE — Therapy (Signed)
OUTPATIENT PHYSICAL THERAPY CERVICAL TREATMENT   Patient Name: Stephanie [name] MRN: [id number] DOB:1980-06-22, 41 y.o., female Today's Date: 03/01/2022   PT End of Session - 03/01/22 1627     Visit Number 6    Date for PT Re-Evaluation 03/20/22    Authorization Type [employer]    Authorization - Number of Visits 49    PT Start Time 1624    PT Stop Time 1703    PT Time Calculation (min) 39 min    Activity Tolerance Patient tolerated treatment well              Past Medical History:  Diagnosis Date   Acid reflux    Anxiety    Arthritis    Asthma    Bipolar depression ([place])    Carpal tunnel syndrome    Depression    Hypothyroid    IBS (irritable bowel syndrome)    Normal pregnancy, first 07/27/2011   Sinusitis    SVD (spontaneous vaginal delivery) 07/28/2011   SVD (spontaneous vaginal delivery) 07/28/2011   Past Surgical History:  Procedure Laterality Date   extraction of wisdom teeth     Patient Active Problem List   Diagnosis Date Noted   SVD (spontaneous vaginal delivery) 07/28/2011    PCP: Rachell Cipro, MD  REFERRING PROVIDER: Rachell Cipro, MD  REFERRING DIAG: Neck pain; back pain   THERAPY DIAG:  Cervicalgia  Other low back pain  Muscle weakness (generalized)  Cramp and spasm  Rationale for Evaluation and Treatment: Rehabilitation  ONSET DATE: back 12/05/21, neck  SUBJECTIVE:                                                                                                                                                                                                         SUBJECTIVE STATEMENT: Pt reports it took 5 days to recover from last PT session ("resisted clams and isometrics hurt a Lot").  She just finished unpacking boxes in storage unit.  She is having nerve conduction of back on Monday.   PERTINENT HISTORY:  Depression, anxiety, psoriatic arthritis, carpal tunnel syndrome  PAIN:  Are you having pain? Yes: NPRS scale:8.5/10  generalized (neck to toes) Pain location: see above  Pain description: dull, constant Aggravating factors: cervical rotation Lt or Rt and weight bearing into RLE Relieving factors: avoiding a lot of neck movement  PRECAUTIONS: None  WEIGHT BEARING RESTRICTIONS: No  FALLS:  Has patient fallen in last 6 months? No  LIVING ENVIRONMENT: Lives with: lives with their family Lives in: House/apartment Stairs: Yes: Internal: 1 steps;  none Has following equipment at home: None  OCCUPATION: Pharmacist, hospital at [employer]: To be able to do daily activities without increased pain.    NEXT MD VISIT: Neurosurgeon: Dr Cyndy Freeze follow up after she gets EMG and CT scan of neck and back.  OBJECTIVE:   DIAGNOSTIC FINDINGS:  Lumbar MRI on 12/13/21: IMPRESSION: 1. No acute abnormality or evidence of cauda equina syndrome. 2. Progressive multilevel lumbar spondylosis as described above, worst at L5-S1 where there is new mild-to-moderate bilateral neuroforaminal stenosis.  PATIENT SURVEYS:  FOTO: BACK 19% (Projected 45% by visit 13)   NECK  SCREENING FOR RED FLAGS 12/22/21: Bowel or bladder incontinence: Yes: has history of incontinence, has worked with pelvic floor PT in the past Spinal tumors: No Cauda equina syndrome: No Compression fracture: No Abdominal aneurysm: No  COGNITION: Overall cognitive status: Within functional limits for tasks assessed  SENSATION: Reports occasional paraesthesias and burning throughout body at times  POSTURE: rounded shoulders and forward head  PALPATION: Muscle spasm and tenderness noted Rt upper trap   CERVICAL ROM: (+) pain with all movements  Active ROM A/PROM (deg) eval  Flexion   Extension 25  Right lateral flexion   Left lateral flexion   Right rotation 40  Left rotation 40   (Blank rows = not tested)  UPPER EXTREMITY ROM:  Reach behind back: Rt to bra line (+) pain, Lt to  inferior scapula Reach behind head: Rt C7 (+) pain, Lt T1  Active ROM Right eval Left eval  Shoulder flexion WNL WNL  Shoulder extension    Shoulder abduction    Shoulder adduction    Shoulder extension    Shoulder internal rotation    Shoulder external rotation    Elbow flexion    Elbow extension    Wrist flexion (+) pain end range   Wrist extension    Wrist ulnar deviation    Wrist radial deviation    Wrist pronation    Wrist supination (+) pain end range    (Blank rows = not tested)  UPPER EXTREMITY MMT:  MMT Right eval Left eval  Shoulder flexion 4 4  Shoulder extension    Shoulder abduction 4 4  Shoulder adduction    Shoulder extension    Shoulder internal rotation    Shoulder external rotation    Middle trapezius    Lower trapezius    Elbow flexion 5 5  Elbow extension 5 5  Wrist flexion    Wrist extension    Wrist ulnar deviation    Wrist radial deviation    Wrist pronation 4 5  Wrist supination 4 (+) pain 5  Grip strength 23.3lb 21.6lb   (Blank rows = not tested)   LE MMT: 12/22/2021: Right hip strength of 4-/5, right hamstring strength of 4/5 Otherwise, WFL   CERVICAL SPECIAL TESTS:  Neck flexor muscle endurance test: not tested due to time limitations 12/22/21: Slump test: Positive  FUNCTIONAL TESTS:    TODAY'S TREATMENT:  DATE: 03/01/22 Pt seen for aquatic therapy today.  Treatment took place in water 27ft8" ft in depth at the [facility] pool. Temp of water was 92.  Pt entered/exited the pool via stairs independently with single rail.   * walking forward / backward no UE support * side stepping with shoulder add/abdct * TrA set with blue short noodle push down * holding wall:  heel / toe raises x 12, cues for form;  marching (limited height-stopped, increased pain to 9/10) * with pink fins on wrists, wide  stance, bracing TrA:  bilat shoulder abdct/add, unilateral and bilat horiz abdct (under 70 abdct); bilat shoulder ER/IR * walking backwards with reciprocal arm  swing with fins on wrists (elbows flexed) * Suspended yellow noodle between legs: gentle cc ski and jumping jack LEs.  * suspended back float (yellow noodle under arms, blue noodle under legs) for decompression - cervical rotation, cervical /trunk lateral flexion, thoracic lifts; Leg/arm lengthener     Pt requires the buoyancy and hydrostatic pressure of water for support, and to offload joints by unweighting joint load by at least 50 % in navel deep water and by at least 75-80% in chest to neck deep water.  Viscosity of the water is needed for resistance of strengthening. Water current perturbations provides challenge to standing balance requiring increased core activation.  PATIENT EDUCATION:  Education details: aquatics progressions/ modifications Person educated: Patient Education method: Medical illustrator Education comprehension: verbalized understanding and returned demonstration  HOME EXERCISE PROGRAM: Need next visit  ASSESSMENT:  CLINICAL IMPRESSION: Gradual reduction of generalized pain while in the water to 6/10.   Pt has increase in RLE radicular symptoms and pain up to 9/10 with marching. She tolerated fins on wrist well, with trunk stabilization/resisted UE exercises. Continued pain relief with decompression exercises/ ROM while suspended by noodles.   Goals are ongoing.   OBJECTIVE IMPAIRMENTS: decreased activity tolerance, decreased mobility, decreased ROM, decreased strength, impaired perceived functional ability, increased muscle spasms, impaired flexibility, impaired sensation, impaired UE functional use, postural dysfunction, and pain.   ACTIVITY LIMITATIONS: carrying, lifting, sleeping, toileting, dressing, reach over head, and caring for others  PARTICIPATION LIMITATIONS: cleaning, laundry,  driving, shopping, occupation, and yard work  PERSONAL FACTORS: Behavior pattern, Past/current experiences, Time since onset of injury/illness/exacerbation, and 3+ comorbidities: psoriatic arthritis, chronic LBP, carpal tunnel syndrome  are also affecting patient's functional outcome.   REHAB POTENTIAL: Good  CLINICAL DECISION MAKING: Unstable/unpredictable  EVALUATION COMPLEXITY: High   GOALS: Goals reviewed with patient? Yes  SHORT TERM GOALS: Target date: 04/26/2022   Pt will be independent with her initial HEP Baseline:  Goal status: INITIAL  2.  .  Pt will report at least a 30% improvement in LBP symptoms since starting PT. Baseline:  Goal status: INITIAL      LONG TERM GOALS: Target date: 04/26/2022  Pt will increase LBP FOTO to at least 45% and cervical FOTO to 55% in order to demonstrate improvements in functional mobility. Baseline:  Goal status: INITIAL  2.  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  Pt will have atleast 10lb improvement in grip strength bilaterally. Baseline: 23lb on Rt  Goal status: INITIAL  4.  Pt will have increase in active cervical ROM to atleast 45 deg bilaterally to improve her ability to check blind spots while driving.  Baseline:  Goal status: INITIAL  5.  Pt  will report atleast 50% improvement in her neck pain from the start of PT.  Baseline:  Goal status: INITIAL    PLAN:  PT FREQUENCY: 2x/week  PT DURATION: 8 weeks  PLANNED INTERVENTIONS: Therapeutic exercises, Therapeutic activity, Neuromuscular re-education, Balance training, Gait training, Patient/Family education, Self Care, Joint mobilization, Joint manipulation, Aquatic Therapy, Dry Needling, Taping, Manual therapy, and Re-evaluation  PLAN FOR NEXT SESSION: update LBP/neck HEP; scap strength; cervical ROM  Kerin Perna, PTA 03/01/22 5:04 PM [facility] [address] [place], [place], [address] Phone: [phone]   Fax:  [phone]

## 2022-03-08 ENCOUNTER — Ambulatory Visit
Payer: Managed Care, Other (non HMO) | Attending: Family Medicine | Admitting: Rehabilitative and Restorative Service Providers"

## 2022-03-08 ENCOUNTER — Encounter: Payer: Self-pay | Admitting: Rehabilitative and Restorative Service Providers"

## 2022-03-08 DIAGNOSIS — M6281 Muscle weakness (generalized): Secondary | ICD-10-CM

## 2022-03-08 DIAGNOSIS — M542 Cervicalgia: Secondary | ICD-10-CM

## 2022-03-08 DIAGNOSIS — R252 Cramp and spasm: Secondary | ICD-10-CM

## 2022-03-08 DIAGNOSIS — M5459 Other low back pain: Secondary | ICD-10-CM

## 2022-03-08 NOTE — Therapy (Signed)
OUTPATIENT PHYSICAL THERAPY TREATMENT NOTE   Patient Name: Stephanie [name] MRN: [id number] DOB:Jun 05, 1980, 41 y.o., female Today's Date: 03/08/2022   PT End of Session - 03/08/22 0936     Visit Number 7    Date for PT Re-Evaluation 03/20/22    Authorization Type Cigna    PT Start Time [phone]    PT Stop Time 1012    PT Time Calculation (min) 38 min    Activity Tolerance Patient tolerated treatment well    Behavior During Therapy [facility] for tasks assessed/performed              Past Medical History:  Diagnosis Date   Acid reflux    Anxiety    Arthritis    Asthma    Bipolar depression ([place])    Carpal tunnel syndrome    Depression    Hypothyroid    IBS (irritable bowel syndrome)    Normal pregnancy, first 07/27/2011   Sinusitis    SVD (spontaneous vaginal delivery) 07/28/2011   SVD (spontaneous vaginal delivery) 07/28/2011   Past Surgical History:  Procedure Laterality Date   extraction of wisdom teeth     Patient Active Problem List   Diagnosis Date Noted   SVD (spontaneous vaginal delivery) 07/28/2011    PCP: Rachell Cipro, MD  REFERRING PROVIDER: Rachell Cipro, MD  REFERRING DIAG: Neck pain; back pain   THERAPY DIAG:  Cervicalgia  Other low back pain  Muscle weakness (generalized)  Cramp and spasm  Rationale for Evaluation and Treatment: Rehabilitation  ONSET DATE: back 12/05/21, neck  SUBJECTIVE:                                                                                                                                                                                                         SUBJECTIVE STATEMENT: Pt reports that she had her nerve study and it revealed radiculopathy.  She states that she sees the Neurosurgeon after her appointment today.  States that she fell this morning when she tripped over wrapping paper. She is having nerve conduction of back on Monday.   PERTINENT HISTORY:  Depression, anxiety, psoriatic arthritis,  carpal tunnel syndrome  PAIN:  Are you having pain? Yes: NPRS scale:  8/10 generalized (neck to toes) Pain location: see above  Pain description: dull, constant Aggravating factors: cervical rotation Lt or Rt and weight bearing into RLE Relieving factors: avoiding a lot of neck movement  PRECAUTIONS: None  WEIGHT BEARING RESTRICTIONS: No  FALLS:  Has patient fallen in last 6 months? 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    NEXT MD VISIT: Neurosurgeon: Dr Cyndy Freeze on 03/08/2022 follow up after she gets EMG and CT scan of neck and back.  Dr Ernie Hew on 03/13/2022.  OBJECTIVE:   DIAGNOSTIC FINDINGS:  Lumbar MRI on 12/13/21: IMPRESSION: 1. No acute abnormality or evidence of cauda equina syndrome. 2. Progressive multilevel lumbar spondylosis as described above, worst at L5-S1 where there is new mild-to-moderate bilateral neuroforaminal stenosis.  PATIENT SURVEYS:  FOTO: BACK 19% (Projected 45% by visit 13)   NECK  SCREENING FOR RED FLAGS 12/22/21: Bowel or bladder incontinence: Yes: has history of incontinence, has worked with pelvic floor PT in the past Spinal tumors: No Cauda equina syndrome: No Compression fracture: No Abdominal aneurysm: No  COGNITION: Overall cognitive status: Within functional limits for tasks assessed  SENSATION: Reports occasional paraesthesias and burning throughout body at times  POSTURE: rounded shoulders and forward head  PALPATION: Muscle spasm and tenderness noted Rt upper trap   CERVICAL ROM: (+) pain with all movements  Active ROM A/PROM (deg) eval  Flexion   Extension 25  Right lateral flexion   Left lateral flexion   Right rotation 40  Left  rotation 40   (Blank rows = not tested)  UPPER EXTREMITY ROM:  Reach behind back: Rt to bra line (+) pain, Lt to inferior scapula Reach behind head: Rt C7 (+) pain, Lt T1  Active ROM Right eval Left eval  Shoulder flexion WNL WNL  Shoulder extension    Shoulder abduction    Shoulder adduction    Shoulder extension    Shoulder internal rotation    Shoulder external rotation    Elbow flexion    Elbow extension    Wrist flexion (+) pain end range   Wrist extension    Wrist ulnar deviation    Wrist radial deviation    Wrist pronation    Wrist supination (+) pain end range    (Blank rows = not tested)  UPPER EXTREMITY MMT:  MMT Right eval Left eval  Shoulder flexion 4 4  Shoulder extension    Shoulder abduction 4 4  Shoulder adduction    Shoulder extension    Shoulder internal rotation    Shoulder external rotation    Middle trapezius    Lower trapezius    Elbow flexion 5 5  Elbow extension 5 5  Wrist flexion    Wrist extension    Wrist ulnar deviation    Wrist radial deviation    Wrist pronation 4 5  Wrist supination 4 (+) pain 5  Grip strength 23.3lb 21.6lb   (Blank rows = not tested)   LE MMT: 12/22/2021: Right hip strength of 4-/5, right hamstring strength of 4/5 Otherwise, WFL   CERVICAL SPECIAL TESTS:  Neck flexor muscle endurance test: not tested due to time limitations 12/22/21: Slump test: Positive  FUNCTIONAL TESTS:  12/26/2021: 5 times sit to stand: 19.9 sec Timed up and go (TUG): 10.5 sec   TODAY'S TREATMENT:  DATE: 03/08/2022 Pt update on her nerve study results on UE and LE.  Nustep level 3 x6 min with PT present to discuss status Supine hamstring stretch with strap 2x20 sec bilat Supine posterior pelvic tilt 2x10 Lower trunk rotation 2x10 reps Single knee to chest 2x20 sec bilat Supine TA contraction while  pushing into thighs 2x10 Supine cervical retraction 2x10 Supine shoulder press 2x10   DATE: 03/01/22 Pt seen for aquatic therapy today.  Treatment took place in water 70f8" ft in [facility] Temp of water was 92.  Pt entered/exited the pool via stairs independently with single rail.   * walking forward / backward no UE support * side stepping with shoulder add/abdct * TrA set with blue short noodle push down * holding wall:  heel / toe raises x 12, cues for form;  marching (limited height-stopped, increased pain to 9/10) * with pink fins on wrists, wide stance, bracing TrA:  bilat shoulder abdct/add, unilateral and bilat horiz abdct (under 70 abdct); bilat shoulder ER/IR * walking backwards with reciprocal arm  swing with fins on wrists (elbows flexed) * Suspended yellow noodle between legs: gentle cc ski and jumping jack LEs.  * suspended back float (yellow noodle under arms, blue noodle under legs) for decompression - cervical rotation, cervical /trunk lateral flexion, thoracic lifts; Leg/arm lengthener     Pt requires the buoyancy and hydrostatic pressure of water for support, and to offload joints by unweighting joint load by at least 50 % in navel deep water and by at least 75-80% in chest to neck deep water.  Viscosity of the water is needed for resistance of strengthening. Water current perturbations provides challenge to standing balance requiring increased core activation.  PATIENT EDUCATION:  Education details: aquatics progressions/ modifications Person educated: Patient Education method: ECustomer service managerEducation comprehension: verbalized understanding and returned demonstration  HOME EXERCISE PROGRAM: Access Code: [email or web address] URL: [email or web address] Date: 02/13/2022 Prepared by: JCandyce Churn  Exercises - Sidelying Transversus Abdominis Bracing  - 1 x daily - 7 x weekly - 1 sets - 10 reps - 10 hold - Clamshell   - 1 x daily - 7 x weekly - 1 sets - 10 reps - Sidelying Reverse Clamshell  - 1 x daily - 7 x weekly - 1 sets - 10 reps - Supine Posterior Pelvic Tilt  - 1 x daily - 7 x weekly - 2 sets - 10 reps - Supine Piriformis Stretch with Foot on Ground  - 1 x daily - 7 x weekly - 1 sets - 2 reps - 20 sec hold - Supine Hip Adductor Stretch  - 1 x daily - 7 x weekly - 1 sets - 3 reps - 20 hold - Supine March with Elevation  - 1 x daily - 7 x weekly - 1 sets - 10 reps - Supine Alternating Knee Taps with Hands  - 1 x daily - 7 x weekly - 1 sets - 10 reps - Hip Flexor Stretch at Edge of Bed (Mirrored)  - 1 x daily - 7 x weekly - 1 sets - 3 reps - 20 hold - Bird Dog  - 1 x daily - 7 x weekly - 1 sets - 10 reps - Seated Upper Trapezius Stretch  - 1 x daily - 7 x weekly - 1 sets - 2 reps - 20 sec hold - Seated Levator Scapulae Stretch  - 1 x daily - 7 x weekly - 1 sets - 2  reps - 20 sec hold - Seated Piriformis Stretch  - 1 x daily - 7 x weekly - 1 sets - 3 reps - 20 hold - Seated Transversus Abdominis Bracing  - 1 x daily - 7 x weekly - 1 sets - 10 reps - Single Leg Stance with Support  - 1 x daily - 7 x weekly - 1 sets - 3 reps - 10 hold - Stride Stance Weight Shift  - 1 x daily - 7 x weekly - 1 sets - 15 reps - 3 hold - Forward Step Up  - 1 x daily - 7 x weekly - 3 sets - 10 reps - Side Lunge Adductor Stretch  - 1 x daily - 7 x weekly - 1 sets - 3 reps - 30 hold - Standing Isometric Hip Abduction with Ball on Wall  - 1 x daily - 7 x weekly - 1 sets - 5 reps - 5 hold - Standing Hip Hinge with Dowel  - 1 x daily - 7 x weekly - 1 sets - 10 reps - Standing Low Shoulder Row with Anchored Resistance  - 1 x daily - 7 x weekly - 1 sets - 10 reps - Standing Shoulder Extension with Resistance  - 1 x daily - 7 x weekly - 1 sets - 10 reps - Standing Shoulder Horizontal Abduction with Resistance  - 1 x daily - 7 x weekly - 2 sets - 10 reps - Shoulder External Rotation and Scapular Retraction with Resistance  - 1 x daily  - 7 x weekly - 2 sets - 10 reps - Standing Diagonal Shoulder Extension with Anchored Resistance  - 1 x daily - 7 x weekly - 1 sets - 10 reps - Half Dead Lift with Kettlebell  - 1 x daily - 7 x weekly - 1 sets - 10 reps - Half Kneeling Hip Flexor Stretch with 3-Way Reach  - 1 x daily - 7 x weekly - 1 sets - 10 reps - Half Kneeling Hip Flexor Stretch with Sidebend  - 1 x daily - 7 x weekly - 1 sets - 10 reps - Half-Kneeling Shoulder Horizontal Abduction with Resistance  - 1 x daily - 7 x weekly - 3 sets - 10 reps - Kneeling Diagonal Chop with Anchored Resistance  - 1 x daily - 7 x weekly - 3 sets - 10 reps - Lateral Step Up  - 1 x daily - 7 x weekly - 2 sets - 10 reps - Gastroc Stretch on Wall  - 1 x daily - 7 x weekly - 1 sets - 2 reps - 30 hold - Standing Soleus Stretch  - 1 x daily - 7 x weekly - 1 sets - 2 reps - 30 hold - Seated Plantar Fascia Mobilization with Small Ball  - 1 x daily - 7 x weekly - 1 sets - 60 hold - Seated Plantar Fascia Stretch  - 1 x daily - 7 x weekly - 3 sets - 10 reps - Supine Hip Adduction Isometric with Ball  - 1 x daily - 7 x weekly - 1 sets - 10 reps - 5 sec hold - Hooklying Isometric Clamshell  - 1 x daily - 7 x weekly - 1 sets - 10 reps - 5 sec hold  ASSESSMENT:  CLINICAL IMPRESSION: Ailah presents to skilled PT stating that she has had her EMG nerve conduction study and gets the full results from Dr Cyndy Freeze after this PT visit.  Patient  reports that she can tell that she has made some improvements since starting skilled PT, but continues to have the numbness and radicular symptoms, especially down her LE.  Patient to have full reassessment next week, following her appointment with her Neurosurgeon to assess what plan will be moving forward with skilled PT.  OBJECTIVE IMPAIRMENTS: decreased activity tolerance, decreased mobility, decreased ROM, decreased strength, impaired perceived functional ability, increased muscle spasms, impaired flexibility, impaired  sensation, impaired UE functional use, postural dysfunction, and pain.   ACTIVITY LIMITATIONS: carrying, lifting, sleeping, toileting, dressing, reach over head, and caring for others  PARTICIPATION LIMITATIONS: cleaning, laundry, driving, shopping, occupation, and yard work  PERSONAL FACTORS: Behavior pattern, Past/current experiences, Time since onset of injury/illness/exacerbation, and 3+ comorbidities: psoriatic arthritis, chronic LBP, carpal tunnel syndrome  are also affecting patient's functional outcome.   REHAB POTENTIAL: Good  CLINICAL DECISION MAKING: Unstable/unpredictable  EVALUATION COMPLEXITY: High   GOALS: Goals reviewed with patient? Yes  SHORT TERM GOALS: Target date: 05/03/2022   Pt will be independent with her initial HEP Baseline:  Goal status: MET  2.  .  Pt will report at least a 30% improvement in LBP symptoms since starting PT. Baseline:  Goal status: IN PROGRESS      LONG TERM GOALS: Target date: 05/03/2022  Pt will increase LBP FOTO to at least 45% and cervical FOTO to 55% in order to demonstrate improvements in functional mobility. Baseline:  Goal status: INITIAL  2.  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  Pt will have atleast 10lb improvement in grip strength bilaterally. Baseline: 23lb on Rt  Goal status: INITIAL  4.  Pt will have increase in active cervical ROM to atleast 45 deg bilaterally to improve her ability to check blind spots while driving.  Baseline:  Goal status: INITIAL  5.  Pt will report atleast 50% improvement in her neck pain from the start of PT.  Baseline:  Goal status: INITIAL    PLAN:  PT FREQUENCY: 2x/week  PT DURATION: 8 weeks  PLANNED INTERVENTIONS: Therapeutic exercises, Therapeutic activity, Neuromuscular re-education, Balance training, Gait training, Patient/Family education, Self Care, Joint  mobilization, Joint manipulation, Aquatic Therapy, Dry Needling, Taping, Manual therapy, and Re-evaluation  PLAN FOR NEXT SESSION: update LBP/neck HEP; scap strength; cervical ROM, aquatic PT.  Ask about MD appointment.  Juel Burrow, PT 03/08/22 10:34 AM  [facility] [address] [place], [place] [address] Phone # [phone] Fax [phone]

## 2022-03-09 ENCOUNTER — Encounter (HOSPITAL_BASED_OUTPATIENT_CLINIC_OR_DEPARTMENT_OTHER): Payer: Self-pay

## 2022-03-09 ENCOUNTER — Ambulatory Visit (HOSPITAL_BASED_OUTPATIENT_CLINIC_OR_DEPARTMENT_OTHER): Payer: Managed Care, Other (non HMO) | Admitting: Physical Therapy

## 2022-03-09 DIAGNOSIS — M542 Cervicalgia: Secondary | ICD-10-CM

## 2022-03-09 DIAGNOSIS — M5459 Other low back pain: Secondary | ICD-10-CM

## 2022-03-09 DIAGNOSIS — M6281 Muscle weakness (generalized): Secondary | ICD-10-CM

## 2022-03-09 NOTE — Therapy (Signed)
[facility] [address] [place], [place], [address] Phone: [phone]   Fax:  [phone]  Patient Details  Name: Stephanie [name] MRN: [id number] Date of Birth: 03-24-1980 Referring Provider:  Lewis Moccasin, MD  Encounter Date: 03/09/2022  Patient arrived at pool, but stated she had forgotten her swim suit.  She reported that she had appt with neurosurgeon and that "there's nothing they could do and I have to see a neurologist".  Pt states she has an upcoming CT of her abdomen, but does not have an appt with Neurologist yet.   She also stated that she had a fall yesterday and she is considering using a rollator at her job and in community since her RLE tends to "drag".   Confirmed next upcoming appt with her.  Pt not seen for treatment today; no charge.    Mayer Camel, PTA 03/09/22 3:46 PM [place]

## 2022-03-13 ENCOUNTER — Encounter (HOSPITAL_BASED_OUTPATIENT_CLINIC_OR_DEPARTMENT_OTHER): Payer: Self-pay | Admitting: Physical Therapy

## 2022-03-13 ENCOUNTER — Ambulatory Visit (HOSPITAL_BASED_OUTPATIENT_CLINIC_OR_DEPARTMENT_OTHER): Payer: Managed Care, Other (non HMO) | Admitting: Physical Therapy

## 2022-03-13 DIAGNOSIS — M542 Cervicalgia: Secondary | ICD-10-CM | POA: Diagnosis not present

## 2022-03-13 DIAGNOSIS — R252 Cramp and spasm: Secondary | ICD-10-CM

## 2022-03-13 DIAGNOSIS — M6281 Muscle weakness (generalized): Secondary | ICD-10-CM

## 2022-03-13 DIAGNOSIS — M5459 Other low back pain: Secondary | ICD-10-CM

## 2022-03-13 NOTE — Therapy (Signed)
OUTPATIENT PHYSICAL THERAPY TREATMENT NOTE   Patient Name: Stephanie [name] MRN: [id number] DOB:[birth year], 41 y.o., female Today's Date: 03/13/2022   PT End of Session - 03/13/22 1553     Visit Number 8    Date for PT Re-Evaluation 03/20/22    Authorization Type [employer]    Authorization - Number of Visits 64    PT Start Time 1533    PT Stop Time 1611    PT Time Calculation (min) 38 min    Activity Tolerance Patient limited by pain    Behavior During Therapy [facility] for tasks assessed/performed              Past Medical History:  Diagnosis Date   Acid reflux    Anxiety    Arthritis    Asthma    Bipolar depression ([place])    Carpal tunnel syndrome    Depression    Hypothyroid    IBS (irritable bowel syndrome)    Normal pregnancy, first 07/27/2011   Sinusitis    SVD (spontaneous vaginal delivery) 07/28/2011   SVD (spontaneous vaginal delivery) 07/28/2011   Past Surgical History:  Procedure Laterality Date   extraction of wisdom teeth     Patient Active Problem List   Diagnosis Date Noted   SVD (spontaneous vaginal delivery) 07/28/2011    PCP: Rachell Cipro, MD  REFERRING PROVIDER: Rachell Cipro, MD  REFERRING DIAG: Neck pain; back pain   THERAPY DIAG:  Cervicalgia  Other low back pain  Muscle weakness (generalized)  Cramp and spasm  Rationale for Evaluation and Treatment: Rehabilitation  ONSET DATE: back 12/05/21, neck  SUBJECTIVE:                                                                                                                                                                                                         SUBJECTIVE STATEMENT: Pt reports she had blood drawn today to test for Lupus and MS. She is using a rollator with ambulation now for safety.   PERTINENT HISTORY:  Depression, anxiety, psoriatic arthritis, carpal tunnel syndrome  PAIN:  Are you having pain? Yes: NPRS scale:  7/10 generalized (LE, back, feet, hands,  next) Pain location: see above  Pain description: dull, constant Aggravating factors: cervical rotation Lt or Rt and weight bearing into RLE Relieving factors: avoiding a lot of neck movement  PRECAUTIONS: None  WEIGHT BEARING RESTRICTIONS: No  FALLS:  Has patient fallen in last 6 months? No  LIVING ENVIRONMENT: Lives with: lives with their family Lives in: House/apartment Stairs: Yes: Internal: 1 steps; none  Has following equipment at home: None  OCCUPATION: Pharmacist, hospital at [employer]: To be able to do daily activities without increased pain.    NEXT MD VISIT: Neurosurgeon: Dr Cyndy Freeze on 03/08/2022 follow up after she gets EMG and CT scan of neck and back.  Dr Ernie Hew on 03/13/2022.  OBJECTIVE:   DIAGNOSTIC FINDINGS:  Lumbar MRI on 12/13/21: IMPRESSION: 1. No acute abnormality or evidence of cauda equina syndrome. 2. Progressive multilevel lumbar spondylosis as described above, worst at L5-S1 where there is new mild-to-moderate bilateral neuroforaminal stenosis.  PATIENT SURVEYS:  FOTO: BACK 19% (Projected 45% by visit 13)   NECK  SCREENING FOR RED FLAGS 12/22/21: Bowel or bladder incontinence: Yes: has history of incontinence, has worked with pelvic floor PT in the past Spinal tumors: No Cauda equina syndrome: No Compression fracture: No Abdominal aneurysm: No  COGNITION: Overall cognitive status: Within functional limits for tasks assessed  SENSATION: Reports occasional paraesthesias and burning throughout body at times  POSTURE: rounded shoulders and forward head  PALPATION: Muscle spasm and tenderness noted Rt upper trap   CERVICAL ROM: (+) pain with all movements  Active ROM A/PROM (deg) eval  Flexion   Extension 25  Right lateral flexion   Left lateral flexion   Right rotation 40  Left rotation 40   (Blank rows = not tested)  UPPER EXTREMITY ROM:  Reach behind back: Rt to bra line (+)  pain, Lt to inferior scapula Reach behind head: Rt C7 (+) pain, Lt T1  Active ROM Right eval Left eval  Shoulder flexion WNL WNL  Shoulder extension    Shoulder abduction    Shoulder adduction    Shoulder extension    Shoulder internal rotation    Shoulder external rotation    Elbow flexion    Elbow extension    Wrist flexion (+) pain end range   Wrist extension    Wrist ulnar deviation    Wrist radial deviation    Wrist pronation    Wrist supination (+) pain end range    (Blank rows = not tested)  UPPER EXTREMITY MMT:  MMT Right eval Left eval  Shoulder flexion 4 4  Shoulder extension    Shoulder abduction 4 4  Shoulder adduction    Shoulder extension    Shoulder internal rotation    Shoulder external rotation    Middle trapezius    Lower trapezius    Elbow flexion 5 5  Elbow extension 5 5  Wrist flexion    Wrist extension    Wrist ulnar deviation    Wrist radial deviation    Wrist pronation 4 5  Wrist supination 4 (+) pain 5  Grip strength 23.3lb 21.6lb   (Blank rows = not tested)   LE MMT: 12/22/2021: Right hip strength of 4-/5, right hamstring strength of 4/5 Otherwise, WFL   CERVICAL SPECIAL TESTS:  Neck flexor muscle endurance test: not tested due to time limitations 12/22/21: Slump test: Positive  FUNCTIONAL TESTS:  12/26/2021: 5 times sit to stand: 19.9 sec Timed up and go (TUG): 10.5 sec   TODAY'S TREATMENT:  DATE: 03/13/22 Pt seen for aquatic therapy today.  Treatment took place [facility] 10f8" [facility] [place] [facility] Temp of [place] was 92.  Pt entered/exited the pool via stairs independently with single rail.   * walking forward / backward no UE support;  gentle row with noodle on surface with noodle (RLE began to go numb with backwards) * sitting on solid yellow noodle:  unilateral shoulder  abdct/add, unilateral and bilat horiz abdct (under 70 abdct); elbow flexion/ext * TrA set with blue short noodle push down; tricep push down in front * lateral neck flexion x 5-10s, x 2; cervical rotation with head nods  * walking forwards with reciprocal arm swing in 455f8" * Suspended yellow noodle between legs: gentle cc ski , jumping jack LEs suspended * suspended back float (yellow noodle under arms, blue noodle under legs, nekdoodle) for decompression -scap squeeze with thoracic lifts x 3s x 10; Leg lengthener x 5 each; Leg press x 2 reps each     Pt requires the buoyancy and hydrostatic pressure of water for support, and to offload joints by unweighting joint load by at least 50 % in navel deep water and by at least 75-80% in chest to neck deep water.  Viscosity of the water is needed for resistance of strengthening. Water current perturbations provides challenge to standing balance requiring increased core activation.  DATE: 03/08/2022 Pt update on her nerve study results on UE and LE.  Nustep level 3 x6 min with PT present to discuss status Supine hamstring stretch with strap 2x20 sec bilat Supine posterior pelvic tilt 2x10 Lower trunk rotation 2x10 reps Single knee to chest 2x20 sec bilat Supine TA contraction while pushing into thighs 2x10 Supine cervical retraction 2x10 Supine shoulder press 2x10   DATE: 03/01/22 Pt seen for aquatic therapy today.  Treatment took place [facility] 75f88f [facility]  Pt entered/exited the pool via stairs independently with single rail.   * walking forward / backward no UE support * side stepping with shoulder add/abdct * TrA set with blue short noodle push down * holding wall:  heel / toe raises x 12, cues for form;  marching (limited height-stopped, increased pain to 9/10) * with pink fins on wrists, wide stance, bracing TrA:  bilat shoulder abdct/add, unilateral and bilat horiz abdct  (under 70 abdct); bilat shoulder ER/IR * walking backwards with reciprocal arm  swing with fins on wrists (elbows flexed) * Suspended yellow noodle between legs: gentle cc ski and jumping jack LEs.  * suspended back float (yellow noodle under arms, blue noodle under legs) for decompression - cervical rotation, cervical /trunk lateral flexion, thoracic lifts; Leg/arm lengthener     Pt requires the buoyancy and hydrostatic pressure of water for support, and to offload joints by unweighting joint load by at least 50 % in navel deep water and by at least 75-80% in chest to neck deep water.  Viscosity of the water is needed for resistance of strengthening. Water current perturbations provides challenge to standing balance requiring increased core activation.  PATIENT EDUCATION:  Education details: aquatics progressions/ modifications Person educated: Patient Education method: ExpCustomer service managerucation comprehension: verbalized understanding and returned demonstration  HOME EXERCISE PROGRAM: Access Code: [email or web address] URL: [email or web address] Date: 02/13/2022 Prepared by: JenCandyce ChurnExercises - Sidelying Transversus Abdominis Bracing  - 1 x daily - 7 x weekly - 1 sets - 10 reps -  10 hold - Clamshell  - 1 x daily - 7 x weekly - 1 sets - 10 reps - Sidelying Reverse Clamshell  - 1 x daily - 7 x weekly - 1 sets - 10 reps - Supine Posterior Pelvic Tilt  - 1 x daily - 7 x weekly - 2 sets - 10 reps - Supine Piriformis Stretch with Foot on Ground  - 1 x daily - 7 x weekly - 1 sets - 2 reps - 20 sec hold - Supine Hip Adductor Stretch  - 1 x daily - 7 x weekly - 1 sets - 3 reps - 20 hold - Supine March with Elevation  - 1 x daily - 7 x weekly - 1 sets - 10 reps - Supine Alternating Knee Taps with Hands  - 1 x daily - 7 x weekly - 1 sets - 10 reps - Hip Flexor Stretch at Edge of Bed (Mirrored)  - 1 x daily - 7 x weekly - 1 sets - 3 reps - 20 hold - Bird Dog  - 1 x daily  - 7 x weekly - 1 sets - 10 reps - Seated Upper Trapezius Stretch  - 1 x daily - 7 x weekly - 1 sets - 2 reps - 20 sec hold - Seated Levator Scapulae Stretch  - 1 x daily - 7 x weekly - 1 sets - 2 reps - 20 sec hold - Seated Piriformis Stretch  - 1 x daily - 7 x weekly - 1 sets - 3 reps - 20 hold - Seated Transversus Abdominis Bracing  - 1 x daily - 7 x weekly - 1 sets - 10 reps - Single Leg Stance with Support  - 1 x daily - 7 x weekly - 1 sets - 3 reps - 10 hold - Stride Stance Weight Shift  - 1 x daily - 7 x weekly - 1 sets - 15 reps - 3 hold - Forward Step Up  - 1 x daily - 7 x weekly - 3 sets - 10 reps - Side Lunge Adductor Stretch  - 1 x daily - 7 x weekly - 1 sets - 3 reps - 30 hold - Standing Isometric Hip Abduction with Ball on Wall  - 1 x daily - 7 x weekly - 1 sets - 5 reps - 5 hold - Standing Hip Hinge with Dowel  - 1 x daily - 7 x weekly - 1 sets - 10 reps - Standing Low Shoulder Row with Anchored Resistance  - 1 x daily - 7 x weekly - 1 sets - 10 reps - Standing Shoulder Extension with Resistance  - 1 x daily - 7 x weekly - 1 sets - 10 reps - Standing Shoulder Horizontal Abduction with Resistance  - 1 x daily - 7 x weekly - 2 sets - 10 reps - Shoulder External Rotation and Scapular Retraction with Resistance  - 1 x daily - 7 x weekly - 2 sets - 10 reps - Standing Diagonal Shoulder Extension with Anchored Resistance  - 1 x daily - 7 x weekly - 1 sets - 10 reps - Half Dead Lift with Kettlebell  - 1 x daily - 7 x weekly - 1 sets - 10 reps - Half Kneeling Hip Flexor Stretch with 3-Way Reach  - 1 x daily - 7 x weekly - 1 sets - 10 reps - Half Kneeling Hip Flexor Stretch with Sidebend  - 1 x daily - 7 x weekly - 1  sets - 10 reps - Half-Kneeling Shoulder Horizontal Abduction with Resistance  - 1 x daily - 7 x weekly - 3 sets - 10 reps - Kneeling Diagonal Chop with Anchored Resistance  - 1 x daily - 7 x weekly - 3 sets - 10 reps - Lateral Step Up  - 1 x daily - 7 x weekly - 2 sets - 10  reps - Gastroc Stretch on Wall  - 1 x daily - 7 x weekly - 1 sets - 2 reps - 30 hold - Standing Soleus Stretch  - 1 x daily - 7 x weekly - 1 sets - 2 reps - 30 hold - Seated Plantar Fascia Mobilization with Small Ball  - 1 x daily - 7 x weekly - 1 sets - 60 hold - Seated Plantar Fascia Stretch  - 1 x daily - 7 x weekly - 3 sets - 10 reps - Supine Hip Adduction Isometric with Ball  - 1 x daily - 7 x weekly - 1 sets - 10 reps - 5 sec hold - Hooklying Isometric Clamshell  - 1 x daily - 7 x weekly - 1 sets - 10 reps - 5 sec hold  ASSESSMENT:  CLINICAL IMPRESSION: Pt participates well throughout despite reports of elevated pain and of numbness in RLE with gait while submerged 85%.  Reps of exercises kept small and pt changed positions as needed for improved tolerance.  Patient to have full reassessment tomorrow, following her appointment with her Neurosurgeon to assess what plan will be moving forward with skilled PT.  OBJECTIVE IMPAIRMENTS: decreased activity tolerance, decreased mobility, decreased ROM, decreased strength, impaired perceived functional ability, increased muscle spasms, impaired flexibility, impaired sensation, impaired UE functional use, postural dysfunction, and pain.   ACTIVITY LIMITATIONS: carrying, lifting, sleeping, toileting, dressing, reach over head, and caring for others  PARTICIPATION LIMITATIONS: cleaning, laundry, driving, shopping, occupation, and yard work  PERSONAL FACTORS: Behavior pattern, Past/current experiences, Time since onset of injury/illness/exacerbation, and 3+ comorbidities: psoriatic arthritis, chronic LBP, carpal tunnel syndrome  are also affecting patient's functional outcome.   REHAB POTENTIAL: Good  CLINICAL DECISION MAKING: Unstable/unpredictable  EVALUATION COMPLEXITY: High   GOALS: Goals reviewed with patient? Yes  SHORT TERM GOALS: Target date: 05/08/2022   Pt will be independent with her initial HEP Baseline:  Goal status: MET  2.   .  Pt will report at least a 30% improvement in LBP symptoms since starting PT. Baseline:  Goal status: IN PROGRESS      LONG TERM GOALS: Target date: 05/08/2022  Pt will increase LBP FOTO to at least 45% and cervical FOTO to 55% in order to demonstrate improvements in functional mobility. Baseline:  Goal status: INITIAL  2.  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  Pt will have atleast 10lb improvement in grip strength bilaterally. Baseline: 23lb on Rt  Goal status: INITIAL  4.  Pt will have increase in active cervical ROM to atleast 45 deg bilaterally to improve her ability to check blind spots while driving.  Baseline:  Goal status: INITIAL  5.  Pt will report atleast 50% improvement in her neck pain from the start of PT.  Baseline:  Goal status: INITIAL    PLAN:  PT FREQUENCY: 2x/week  PT DURATION: 8 weeks  PLANNED INTERVENTIONS: Therapeutic exercises, Therapeutic activity, Neuromuscular re-education, Balance training, Gait training, Patient/Family education, Self Care, Joint  mobilization, Joint manipulation, Aquatic Therapy, Dry Needling, Taping, Manual therapy, and Re-evaluation  PLAN FOR NEXT SESSION: update LBP/neck HEP; scap strength; cervical ROM, aquatic PT.  Ask about MD appointment.  Kerin Perna, PTA 03/13/22 4:14 PM [facility] [address] [place], [place], [address] Phone: [phone]   Fax:  [phone]

## 2022-03-14 ENCOUNTER — Encounter: Payer: Self-pay | Admitting: Rehabilitative and Restorative Service Providers"

## 2022-03-14 ENCOUNTER — Ambulatory Visit: Payer: Managed Care, Other (non HMO) | Admitting: Rehabilitative and Restorative Service Providers"

## 2022-03-14 DIAGNOSIS — M5459 Other low back pain: Secondary | ICD-10-CM

## 2022-03-14 DIAGNOSIS — R252 Cramp and spasm: Secondary | ICD-10-CM

## 2022-03-14 DIAGNOSIS — M542 Cervicalgia: Secondary | ICD-10-CM

## 2022-03-14 DIAGNOSIS — M6281 Muscle weakness (generalized): Secondary | ICD-10-CM

## 2022-03-14 NOTE — Therapy (Signed)
OUTPATIENT PHYSICAL THERAPY TREATMENT NOTE AND DISCHARGE SUMMARY   Patient Name: Stephanie [name] MRN: [id number] DOB:12/16/1980, 41 y.o., female Today's Date: 03/14/2022   PT End of Session - 03/14/22 1149     Visit Number 9    Date for PT Re-Evaluation 03/20/22    Authorization Type [employer]    PT Start Time 1143    PT Stop Time 1225    PT Time Calculation (min) 42 min    Activity Tolerance Patient limited by pain    Behavior During Therapy [facility] for tasks assessed/performed              Past Medical History:  Diagnosis Date   Acid reflux    Anxiety    Arthritis    Asthma    Bipolar depression ([place])    Carpal tunnel syndrome    Depression    Hypothyroid    IBS (irritable bowel syndrome)    Normal pregnancy, first 07/27/2011   Sinusitis    SVD (spontaneous vaginal delivery) 07/28/2011   SVD (spontaneous vaginal delivery) 07/28/2011   Past Surgical History:  Procedure Laterality Date   extraction of wisdom teeth     Patient Active Problem List   Diagnosis Date Noted   SVD (spontaneous vaginal delivery) 07/28/2011    PCP: Rachell Cipro, MD  REFERRING PROVIDER: Rachell Cipro, MD  REFERRING DIAG: Neck pain; back pain   THERAPY DIAG:  Cervicalgia  Other low back pain  Muscle weakness (generalized)  Cramp and spasm  Rationale for Evaluation and Treatment: Rehabilitation  ONSET DATE: back 12/05/21, neck  SUBJECTIVE:                                                                                                                                                                                                         SUBJECTIVE STATEMENT: Pt reports that her follow up appointment with the Neurologist is scheduled for February.  Patient states that she has made at least 40% improvement with both her cervical and lumbar since initial evaluation.  Pt reports that she has access to a pool to continue with exercises.  PERTINENT HISTORY:  Depression, anxiety,  psoriatic arthritis, carpal tunnel syndrome  PAIN:  Are you having pain? Yes: NPRS scale:  7/10 generalized (LE, back, feet, hands, next) Pain location: see above  Pain description: dull, constant Aggravating factors: cervical rotation Lt or Rt and weight bearing into RLE Relieving factors: avoiding a lot of neck movement  PRECAUTIONS: None  WEIGHT BEARING RESTRICTIONS: No  FALLS:  Has patient fallen in last 6 months? No  LIVING ENVIRONMENT: Lives with: lives with their family Lives in: House/apartment Stairs: Yes: Internal: 1 steps; none Has following equipment at home: None  OCCUPATION: Pharmacist, hospital at [employer]  PLOF: Independent  PATIENT GOALS: To be able to do daily activities without increased pain.    NEXT MD VISIT: Neurosurgeon: Dr Cyndy Freeze on 03/08/2022 follow up after she gets EMG and CT scan of neck and back.  Dr Ernie Hew on 03/13/2022.  OBJECTIVE:   DIAGNOSTIC FINDINGS:  Lumbar MRI on 12/13/21: IMPRESSION: 1. No acute abnormality or evidence of cauda equina syndrome. 2. Progressive multilevel lumbar spondylosis as described above, worst at L5-S1 where there is new mild-to-moderate bilateral neuroforaminal stenosis.  PATIENT SURVEYS:  Evaluation: FOTO: BACK 19% (Projected 45% by visit 13)   NECK 31% (projected 51% by visit 12)  03/14/2022: Lumbar FOTO:  28% Cervical FOTO:  41%  SCREENING FOR RED FLAGS 12/22/21: Bowel or bladder incontinence: Yes: has history of incontinence, has worked with pelvic floor PT in the past Spinal tumors: No Cauda equina syndrome: No Compression fracture: No Abdominal aneurysm: No  COGNITION: Overall cognitive status: Within functional limits for tasks assessed  SENSATION: Reports occasional paraesthesias and burning throughout body at times  POSTURE: rounded shoulders and forward head  PALPATION: Muscle spasm and tenderness noted Rt upper trap   CERVICAL ROM: (+) pain with all  movements  Active ROM A/PROM (deg) eval A/ROM (deg) 03/14/22  Flexion  40  Extension 25 25  Right lateral flexion  30  Left lateral flexion  40  Right rotation 40 50  Left rotation 40 60   (Blank rows = not tested)  UPPER EXTREMITY ROM:  Reach behind back: Rt to bra line (+) pain, Lt to inferior scapula Reach behind head: Rt C7 (+) pain, Lt T1  Active ROM Right eval Left eval  Shoulder flexion WNL WNL  Shoulder extension    Shoulder abduction    Shoulder adduction    Shoulder extension    Shoulder internal rotation    Shoulder external rotation    Elbow flexion    Elbow extension    Wrist flexion (+) pain end range   Wrist extension    Wrist ulnar deviation    Wrist radial deviation    Wrist pronation    Wrist supination (+) pain end range    (Blank rows = not tested)  UPPER EXTREMITY MMT:  MMT Right eval Left eval  Shoulder flexion 4 4  Shoulder extension    Shoulder abduction 4 4  Shoulder adduction    Shoulder extension    Shoulder internal rotation    Shoulder external rotation    Middle trapezius    Lower trapezius    Elbow flexion 5 5  Elbow extension 5 5  Wrist flexion    Wrist extension    Wrist ulnar deviation    Wrist radial deviation    Wrist pronation 4 5  Wrist supination 4 (+) pain 5  Grip strength 23.3lb 21.6lb   (Blank rows = not tested)  03/14/2022: Grip Strength:  Right 45 lbs, Left 38 lbs  LE MMT: 12/22/2021: Right hip strength of 4-/5, right hamstring strength of 4/5 Otherwise, [facility]  03/14/2022:  Bilat UE  strength is grossly 4+/5 throughout   Bilat LE strength is grossly 4/5 throughout   CERVICAL SPECIAL TESTS:  Neck flexor muscle endurance test: not tested due to time limitations 12/22/21: Slump test: Positive  FUNCTIONAL TESTS:  12/26/2021: 5 times sit to stand: 19.9 sec  Timed up and go (TUG): 10.5 sec  03/14/2022: 5 times sit to stand: 14.5 sec Timed up and go (TUG): 13.4 sec   TODAY'S TREATMENT:                                                                                                                                DATE: 03/14/2022 Nustep level 3 x6 min with PT present to discuss status Retesting of objective measures and FOTO (see above) Seated:  cervical retraction and scapular retraction 2x10 Upper Trap and Levator stretch 2x20 sec bilat Seated shoulder ER and shoulder horizontal abduction with yellow tband 2x10 Seated piriformis stretch and hamstring stretch 2x20 sec bilat Seated thoracic rotation x10 bilat   DATE: 03/13/22 Pt seen for aquatic therapy today.  Treatment took place [place] 11f8" [place] of [place] was 92.  Pt entered/exited the pool via stairs independently with single rail.   * walking forward / backward no UE support;  gentle row with noodle on surface with noodle (RLE began to go numb with backwards) * sitting on solid yellow noodle:  unilateral shoulder abdct/add, unilateral and bilat horiz abdct (under 70 abdct); elbow flexion/ext * TrA set with blue short noodle push down; tricep push down in front * lateral neck flexion x 5-10s, x 2; cervical rotation with head nods  * walking forwards with reciprocal arm swing in 430f8" * Suspended yellow noodle between legs: gentle cc ski , jumping jack LEs suspended * suspended back float (yellow noodle under arms, blue noodle under legs, nekdoodle) for decompression -scap squeeze with thoracic lifts x 3s x 10; Leg lengthener x 5 each; Leg press x 2 reps each     Pt requires the buoyancy and hydrostatic pressure of water for support, and to offload joints by unweighting joint load by at least 50 % in navel deep water and by at least 75-80% in chest to neck deep water.  Viscosity of the water is needed for resistance of strengthening. Water current perturbations provides challenge to standing balance requiring increased core activation.  DATE: 03/08/2022 Pt update on her nerve study  results on UE and LE.  Nustep level 3 x6 min with PT present to discuss status Supine hamstring stretch with strap 2x20 sec bilat Supine posterior pelvic tilt 2x10 Lower trunk rotation 2x10 reps Single knee to chest 2x20 sec bilat Supine TA contraction while pushing into thighs 2x10 Supine cervical retraction 2x10 Supine shoulder press 2x10    PATIENT EDUCATION:  Education details: aquatics progressions/ modifications Person educated: Patient Education method: ExCustomer service managerducation comprehension: verbalized understanding and returned demonstration  HOME EXERCISE PROGRAM: Access Code: [email or web address]: [email or web address] Date: 03/14/2022 Prepared by: ShShelby Dubinenke  Exercises - Seated Upper Trapezius Stretch  - 1 x daily - 7 x weekly - 1 sets - 2 reps - 20 sec hold - Seated Levator Scapulae Stretch  - 1 x  daily - 7 x weekly - 1 sets - 2 reps - 20 sec hold - Seated Scapular Retraction  - 1 x daily - 7 x weekly - 2 sets - 10 reps - Seated Cervical Retraction  - 1 x daily - 7 x weekly - 2 sets - 10 reps - Shoulder External Rotation and Scapular Retraction with Resistance  - 1 x daily - 7 x weekly - 2 sets - 10 reps - Standing Shoulder Row with Anchored Resistance  - 1 x daily - 7 x weekly - 2 sets - 10 reps - Standing Shoulder Horizontal Abduction with Resistance  - 1 x daily - 7 x weekly - 2 sets - 10 reps - Supine Lower Trunk Rotation  - 1 x daily - 7 x weekly - 1 sets - 10 reps - Supine Posterior Pelvic Tilt  - 1 x daily - 7 x weekly - 2 sets - 10 reps - Supine Transversus Abdominis Bracing - Hands on Stomach  - 1 x daily - 7 x weekly - 2 sets - 10 reps - Supine March  - 1 x daily - 7 x weekly - 2 sets - 10 reps - Sidelying Thoracic Rotation with Open Book  - 1 x daily - 7 x weekly - 1 sets - 10 reps - Seated Piriformis Stretch  - 1 x daily - 7 x weekly - 1 sets - 2 reps - 20 sec hold - Seated Hamstring Stretch  - 1 x daily - 7 x weekly - 1 sets - 2  reps - 20 sec hold - Seated Hip Abduction with Resistance  - 1 x daily - 7 x weekly - 2 sets - 10 reps - Standing Thoracic Open Book at [place]  - 1 x daily - 7 x weekly - 1 sets - 10 reps  ASSESSMENT:  CLINICAL IMPRESSION: Ms Dunigan presents to skilled PT reporting making some progress with PT, but continues to have pain.  States that at times if feels as if there is a disconnect between what she is asking her arms and legs to do and what they do, so she is using the 4WRW to decrease her risk of falling.  Provided pt with an updated HEP of exercises that she can easily perform at this stage to continue to address her cervical and lumbar pain.  Pt able to demonstrate all exercises during session and provided with handout and yellow and red theraband.  Patient has met most PT goals, but is unable to work on floor transfers secondary to weakness and instability.  Patient reports that her PCP will be referring her to Pelvic PT soon secondary to her constipation and difficulty with having a BM.  Patient discharged from orthopedic PT at this time to continue with HEP and will resume with new evaluation for pelvic floor PT once order received.  OBJECTIVE IMPAIRMENTS: decreased activity tolerance, decreased mobility, decreased ROM, decreased strength, impaired perceived functional ability, increased muscle spasms, impaired flexibility, impaired sensation, impaired UE functional use, postural dysfunction, and pain.   ACTIVITY LIMITATIONS: carrying, lifting, sleeping, toileting, dressing, reach over head, and caring for others  PARTICIPATION LIMITATIONS: cleaning, laundry, driving, shopping, occupation, and yard work  PERSONAL FACTORS: Behavior pattern, Past/current experiences, Time since onset of injury/illness/exacerbation, and 3+ comorbidities: psoriatic arthritis, chronic LBP, carpal tunnel syndrome  are also affecting patient's functional outcome.   REHAB POTENTIAL: Good  CLINICAL DECISION MAKING:  Unstable/unpredictable  EVALUATION COMPLEXITY: High   GOALS: Goals reviewed with patient?  Yes  SHORT TERM GOALS: Target date: 03/20/2022   Pt will be independent with her initial HEP Baseline:  Goal status: MET  2.  .  Pt will report at least a 30% improvement in LBP symptoms since starting PT. Baseline:  Goal status: MET on 03/14/2022      LONG TERM GOALS: Target date: 03/20/2022  Pt will increase LBP FOTO to at least 45% and cervical FOTO to 55% in order to demonstrate improvements in functional mobility. Baseline:  Goal status: PARTIALLY MET (see above)  2.  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  Pt will have atleast 10lb improvement in grip strength bilaterally. Baseline: 23lb on Rt  Goal status: MET on 03/14/22  4.  Pt will have increase in active cervical ROM to atleast 45 deg bilaterally to improve her ability to check blind spots while driving.  Baseline:  Goal status: MET on 03/14/2022  5.  Pt will report atleast 50% improvement in her neck pain from the start of PT.  Baseline:  Goal status: PARTIALLY MET (reports 40% improvement on cervical pain)    PLAN:  PT FREQUENCY: 2x/week  PT DURATION: 8 weeks  PLANNED INTERVENTIONS: Therapeutic exercises, Therapeutic activity, Neuromuscular re-education, Balance training, Gait training, Patient/Family education, Self Care, Joint mobilization, Joint manipulation, Aquatic Therapy, Dry Needling, Taping, Manual therapy, and Re-evaluation  PLAN FOR NEXT SESSION: Discharge completed on 03/14/2022   PHYSICAL THERAPY DISCHARGE SUMMARY  Patient agrees to discharge. Patient goals were partially met. Patient is being discharged due to  being independent with HEP and needing further medical follow up and testing with Neurologist.  Per pt report, she will be receiving a referral to pelvic floor PT soon.    Juel Burrow, PT 03/14/22 12:50 PM  [facility] [address] [place], [place] [address] Phone # [phone] Fax [phone]

## 2022-03-22 ENCOUNTER — Ambulatory Visit: Payer: Managed Care, Other (non HMO) | Attending: Physical Medicine and Rehabilitation

## 2022-03-22 ENCOUNTER — Other Ambulatory Visit: Payer: Self-pay

## 2022-03-22 DIAGNOSIS — N3946 Mixed incontinence: Secondary | ICD-10-CM | POA: Diagnosis present

## 2022-03-22 DIAGNOSIS — M62838 Other muscle spasm: Secondary | ICD-10-CM | POA: Diagnosis present

## 2022-03-22 DIAGNOSIS — R279 Unspecified lack of coordination: Secondary | ICD-10-CM | POA: Diagnosis present

## 2022-03-22 DIAGNOSIS — M5459 Other low back pain: Secondary | ICD-10-CM | POA: Diagnosis not present

## 2022-03-22 DIAGNOSIS — M6281 Muscle weakness (generalized): Secondary | ICD-10-CM | POA: Insufficient documentation

## 2022-03-22 DIAGNOSIS — R293 Abnormal posture: Secondary | ICD-10-CM | POA: Insufficient documentation

## 2022-03-22 NOTE — Patient Instructions (Signed)
Squatty potty: When your knees are level or below the level of your hips, pelvic floor muscles are pressed against rectum, preventing ease of bowel movement. By getting knees above the level of the hips, these pelvic floor muscles relax, allowing easier passage of bowel movement. Ways to get knees above hips: o Squatty Potty (7inch and 9inch versions) o Small stool o Roll of toilet paper under each foot o Hardback book or stack of magazines under each foot  Relaxed Toileting mechanics: Once in this position, make sure to lean forward with forearms on thighs, wide knees, relaxed stomach, and breathe.   Double-voiding:  This technique is to help with post-void dribbling, or leaking a little bit when you stand up right after urinating.  Use relaxed toileting mechanics to urinate as much as you feel like you have to without straining.  Sit back upright from leaning forward and relax this way for 10-20 seconds.  Lean forward again to finish voiding any amount more.    [facility] [address], [address] [place], [place] [address] Phone # [phone] Fax [phone]  
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
## 2022-04-02 ENCOUNTER — Ambulatory Visit (INDEPENDENT_AMBULATORY_CARE_PROVIDER_SITE_OTHER): Payer: Managed Care, Other (non HMO) | Admitting: Family Medicine

## 2022-04-02 DIAGNOSIS — Z713 Dietary counseling and surveillance: Secondary | ICD-10-CM

## 2022-04-02 NOTE — Progress Notes (Signed)
Medical Nutrition Therapy PCP Rachell Cipro, MD Therapists [facility] Chucky May, MD Appt start time: 1500 end time: 1600 (1 hour) Primary concerns today: Referred by Rachell Cipro, MD for MNT related to preDM (A1c 5.7 on 09/05/20), obesity, HLD, and eating D/O.  Diagnoses also include bipolar, hypothyroid, and ex-induced asthma.   Relevant history/background: At age 42, in response to a chaotic home and her parents' divorce, Mckell started binge-eating sugar.  By 3rd grade, she started restricting, which worsened at puberty.  At age 60, anorexia was extreme, and she lost 25 lb in a month.  She started medication for bipolar D/O before age 41, and then started binge-eating sugary foods again and gained a lot of weight.  She lost to 150s in early 26s, before getting pregnant with her daughter.  Lost pregnancy weight using [employer] again after the birth of her daughter, and went through a manic phase where weight loss was maintained.  She started seeing [facility] for body dysmorphia in Sept 2021, after which she reintroduced sugar to her diet.  With a family hx of diabetes, she had avoided sugar for the previous 20 years, but last fall's sugar "allowance" led to frequent intake of sugary foods and weight gain.  Yael wants to be able to maintain moderation with respect to diet, while also reducing her DM risk and stabilizing weight and appetite.  Prior to getting her own therapy re. body image, Verdia's 9-YO daughter started seeing a therapist for concerns about disordered eating, but is doing pretty well right now.  Shane's mom has her own disordered eating, and poor appetite control.  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  Currently tracking food intake with [employer] app.  She lives with her 9-YO daughter and husband.   Assessment:   Donja has had chronic diarrhea for several weeks, and she has had difficulty swallowing as well as  walking and even holding onto objects.  She has an abdominal CT scan tomorrow, and an appt with a neurologist on Feb 13.   MRI showed no abnormalities, and nerve conduction tests revealed evidence of nerve pathology.  She is still limited by back pain; has been going to PT for back pain, usually 2 X wk.   Encouraged prioritizing sleep at this time, which also means making sure she eats adequately throughout the day, so she won't be over-hungry at night, or feel compelled to eat late, then staying up b/c of reflux.    Weight: 174.6 lb. (182.2 lb on 02/13/22). (Ht is 63".)  This is ~34-lb weight loss since April 2023.   Usual eating pattern:  Most days getting 3 meals and 2 snacks.  Vegetable intake:  Usually getting in 7-12 meals/week.   Usual physical activity:  PT water exercise at [facility] 1 X wk; [facility] 1-2 X wk, and ~10 minutes of home PT exercises ~6 X wk.  Weekly meal planning: Doing regularly most Saturdays.   Weekday lunch prep:  daily; packing the night before.   24-hr recall:  (Up at 3 AM; awoke with stomach pain) B (9 AM)-  1 [employer] apple fig bar (140 kcal), water Snk ( AM)-  --- L (12:30 PM)-  8-in [place], provolone, let, mayo sub, water Snk ( PM)-  water D (6:30 PM)-  1 1/2 c chx and dumplings, 1 biscuit, water Snk (9:30)-  1 large [employer] cookie, 1 c warm lactose-free milk Typical day? No.  Nutritional Diagnosis: Regression on NB-2.1 Physical inactivity as related to energy intake as evidenced by limited physical activity d/t current disability.     Handouts given during visit include: After-Visit Summary (AVS)  Monitoring/Evaluation:  Dietary intake, exercise, and body weight in 6 week(s).

## 2022-04-02 NOTE — Patient Instructions (Addendum)
Even if you don't feel hungry, eat at regular intervals through the day, so you won't be ravenous late in the day, needing to stay up if you eat b/c of reflux.     - Aim to be done eating by 7 PM.    Consider each meal an opportunity to get protein.     - For breakfast, you can boil some eggs in advance, or make a crustless quiche on the weekend.    Easy to swallow foods:  Yogurt (blend with cherries) Pureed soups Smoothies Well cooked vegetables Pears Cheese Pasta with sauce  Keep a list of any other foods you find are well tolerated.    Follow-up:  Feb 19 at 11 AM.

## 2022-04-03 ENCOUNTER — Emergency Department (HOSPITAL_COMMUNITY)
Admission: EM | Admit: 2022-04-03 | Discharge: 2022-04-04 | Disposition: A | Discharge status: Home / Self Care | Payer: Managed Care, Other (non HMO) | Attending: Emergency Medicine | Admitting: Emergency Medicine

## 2022-04-03 ENCOUNTER — Other Ambulatory Visit: Payer: Self-pay

## 2022-04-03 ENCOUNTER — Encounter (HOSPITAL_COMMUNITY): Payer: Self-pay

## 2022-04-03 ENCOUNTER — Ambulatory Visit
Admission: RE | Admit: 2022-04-03 | Discharge: 2022-04-03 | Disposition: A | Discharge status: Home / Self Care | Payer: Managed Care, Other (non HMO) | Source: Ambulatory Visit | Attending: Nurse Practitioner | Admitting: Nurse Practitioner

## 2022-04-03 DIAGNOSIS — D72829 Elevated white blood cell count, unspecified: Secondary | ICD-10-CM | POA: Diagnosis not present

## 2022-04-03 DIAGNOSIS — G8929 Other chronic pain: Secondary | ICD-10-CM | POA: Diagnosis not present

## 2022-04-03 DIAGNOSIS — R32 Unspecified urinary incontinence: Secondary | ICD-10-CM

## 2022-04-03 DIAGNOSIS — J45909 Unspecified asthma, uncomplicated: Secondary | ICD-10-CM | POA: Insufficient documentation

## 2022-04-03 DIAGNOSIS — R109 Unspecified abdominal pain: Secondary | ICD-10-CM | POA: Insufficient documentation

## 2022-04-03 DIAGNOSIS — R159 Full incontinence of feces: Secondary | ICD-10-CM

## 2022-04-03 LAB — URINALYSIS, ROUTINE W REFLEX MICROSCOPIC
Bilirubin Urine: NEGATIVE
Glucose, UA: NEGATIVE mg/dL
Hgb urine dipstick: NEGATIVE
Ketones, ur: NEGATIVE mg/dL
Leukocytes,Ua: NEGATIVE
Nitrite: NEGATIVE
Protein, ur: NEGATIVE mg/dL
Specific Gravity, Urine: 1.015 (ref 1.005–1.030)
pH: 8 (ref 5.0–8.0)

## 2022-04-03 LAB — CBC WITH DIFFERENTIAL/PLATELET
Abs Immature Granulocytes: 0.02 10*3/uL (ref 0.00–0.07)
Basophils Absolute: 0.1 10*3/uL (ref 0.0–0.1)
Basophils Relative: 1 %
Eosinophils Absolute: 0.1 10*3/uL (ref 0.0–0.5)
Eosinophils Relative: 1 %
HCT: 39.4 % (ref 36.0–46.0)
Hemoglobin: 13.1 g/dL (ref 12.0–15.0)
Immature Granulocytes: 0 %
Lymphocytes Relative: 37 %
Lymphs Abs: 4.3 10*3/uL — ABNORMAL HIGH (ref 0.7–4.0)
MCH: 28.1 pg (ref 26.0–34.0)
MCHC: 33.2 g/dL (ref 30.0–36.0)
MCV: 84.5 fL (ref 80.0–100.0)
Monocytes Absolute: 1 10*3/uL (ref 0.1–1.0)
Monocytes Relative: 9 %
Neutro Abs: 6.2 10*3/uL (ref 1.7–7.7)
Neutrophils Relative %: 52 %
Platelets: 333 10*3/uL (ref 150–400)
RBC: 4.66 MIL/uL (ref 3.87–5.11)
RDW: 13.8 % (ref 11.5–15.5)
WBC: 11.8 10*3/uL — ABNORMAL HIGH (ref 4.0–10.5)
nRBC: 0 % (ref 0.0–0.2)

## 2022-04-03 LAB — COMPREHENSIVE METABOLIC PANEL
ALT: 19 U/L (ref 0–44)
AST: 21 U/L (ref 15–41)
Albumin: 4.3 g/dL (ref 3.5–5.0)
Alkaline Phosphatase: 48 U/L (ref 38–126)
Anion gap: 9 (ref 5–15)
BUN: 11 mg/dL (ref 6–20)
CO2: 26 mmol/L (ref 22–32)
Calcium: 9.2 mg/dL (ref 8.9–10.3)
Chloride: 100 mmol/L (ref 98–111)
Creatinine, Ser: 0.63 mg/dL (ref 0.44–1.00)
GFR, Estimated: >60 mL/min (ref >60)
Glucose, Bld: 106 mg/dL — ABNORMAL HIGH (ref 70–99)
Potassium: 3.4 mmol/L — ABNORMAL LOW (ref 3.5–5.1)
Sodium: 135 mmol/L (ref 135–145)
Total Bilirubin: 0.7 mg/dL (ref 0.3–1.2)
Total Protein: 7 g/dL (ref 6.5–8.1)

## 2022-04-03 LAB — LIPASE, BLOOD: Lipase: 35 U/L (ref 11–51)

## 2022-04-03 LAB — PREGNANCY, URINE: Preg Test, Ur: NEGATIVE

## 2022-04-03 MED ORDER — ONDANSETRON 4 MG PO TBDP
4.0000 mg | ORAL_TABLET | Freq: Once | ORAL | Status: AC
  Administered 2022-04-03: 4 mg via ORAL
  Filled 2022-04-03: qty 1

## 2022-04-03 MED ORDER — IOPAMIDOL (ISOVUE-300) INJECTION 61%
100.0000 mL | Freq: Once | INTRAVENOUS | Status: AC | PRN
  Administered 2022-04-03: 100 mL via INTRAVENOUS

## 2022-04-03 NOTE — ED Triage Notes (Signed)
Arrives EMS from home with ongoing abdominal pian since ~July . Went CT today outpatient.   Diarrhea occurred immediately after CT scan. 2 hours pta had excruciating" abdominal pain that is contraction like.

## 2022-04-03 NOTE — ED Provider Triage Note (Signed)
Emergency Medicine Provider Triage Evaluation Note  Stephanie [name] , a 42 y.o. female  was evaluated in triage.  Pt complains of abdominal pain onset 530.  Patient notes that she went to get a CT scan of her abdominal pain that has been bothering her.  Notes that after the CT scan she began to have episodes of watery nonbloody diarrhea.  Denies sick contacts.  Patient has associated cough that has been getting better.  Denies nausea, vomiting, vaginal bleeding, vaginal discharge, urinary symptoms.  Review of Systems  Positive:  Negative:   Physical Exam  BP (!) 135/55   Pulse 97   Temp 98.2 F (36.8 C)   Resp 18   Ht 5\' 3"  (1.6 m)   Wt 82.6 kg   SpO2 100%   BMI 32.24 kg/m  Gen:   Awake, no distress   Resp:  Normal effort  MSK:   Moves extremities without difficulty  Other:  Generalized abdominal tenderness to palpation.  Uncomfortable appearing.  Medical Decision Making  Medically screening exam initiated at 10:45 PM.  Appropriate orders placed.  TIEGAN JAMBOR was informed that the remainder of the evaluation will be completed by another provider, this initial triage assessment does not replace that evaluation, and the importance of remaining in the ED until their evaluation is complete.    Ceejay Kegley A, PA-C 04/03/22 2245

## 2022-04-04 MED ORDER — DICYCLOMINE HCL 10 MG PO CAPS
10.0000 mg | ORAL_CAPSULE | Freq: Once | ORAL | Status: AC
  Administered 2022-04-04: 10 mg via ORAL
  Filled 2022-04-04: qty 1

## 2022-04-04 MED ORDER — DICYCLOMINE HCL 20 MG PO TABS: 20.0000 mg | ORAL_TABLET | Freq: Two times a day (BID) | ORAL | 0 refills | Status: DC | PRN

## 2022-04-04 NOTE — Progress Notes (Signed)
04/04/2022 Stephanie [name] [id number] 12-Jun-1980  Referring provider: Fanny Bien, MD Primary GI doctor: Dr. Loletha Carrow  ASSESSMENT AND PLAN:   42 year old female with history of psoriatic arthritis presents with a  multitude of symptoms, including dysphagia, GERD, AB pain with nocturnal symptoms, alternating diarrhea/constipation, and rectal bleeding and has lost 30 lbs since April.  Had colonoscopy 2018 with Dr. Collene Mares, will try to get report.  Recent CT with esophagitis  Previously diagnosed with IBS and patient is anxious,  however with her symptoms will plan for EGD and colonoscopy to rule out esophagitis with steroid use, GERD, PUD, H pylori, IBD.   In addition some of the patient;s AB pain and diarrhea improved with the Humira, she is having to stop this due to neuropathy and plans on going to Rinvoq which would not be GI dosing if that was needed Will get fecal calprotectin, TTG/IGA, TSH, and CBC, CMET.  I did not get ESR/CRP due to psoriatic arthritis history, fecal cal should be more specific.  Discussed FODMAP Start on trulance, did not tolerate linzess, continue pelvic floor PT Can do dicyclomine 1/2 as needed Start on pantoprazole 20 mg daily ( sensitive to medications) Can consider SIBO testing if negative  I recommend upper gastrointestinal and colorectal evaluation with an EGD and colonoscopy.  Risk of bowel prep, conscious sedation, and EGD and colonoscopy were discussed.  Risks include but are not limited to dehydration, pain, bleeding, cardiopulmonary process, bowel perforation, or other possible adverse outcomes..  Treatment plan was discussed with patient, and agreed upon.   Patient Care Team: Fanny Bien, MD as PCP - General (Family Medicine) Kennith Center, RD as Dietitian (Family Medicine)  HISTORY OF PRESENT ILLNESS: 42 y.o. female with a past medical history of GERD, psoriatic arthritis follows with Dr. Trudie Reed at [facility] rheumatology, switching  from humira to rinvoq, BMI 32, cough, neuropathy and others listed below presents for evaluation of GERD and AB pain.   Labs reviewed from 04/03/22 show no anemia Hgb 13, platelets 333, WBC 11.8.  K3.4, BUN 11, creatinine 0.63, normal liver function CT abdomen pelvis with contrast for urinary incontinence right abdominal pain shows no acute abnormality abdomen or pelvis.  Small hiatal hernia with mild symmetric distal esophageal wall thickening reflux esophagitis.  Patient is here with her mother, very anxious and seems to have a lot of side effects from medications.  She states humira caused her to have more constipation rather than diarrhea, made her rash worse, and gave her neurological issues, has radiculopathy and neuropathy, walking with walker at this time and following with neurologist.  Trying to switch from humira to Rinvoq.   She states 2-3 weeks ago stopped ABX, got flu/bronchitis and was on cipro/doxycycline and prednisone and steroid inhaler. States she had flushing, one leg hot and one leg cold, was red/flushing with steroid.  Had to have recent diflucan for yeast a week ago.   She has history of GERD, with omeprazole had severe diarrhea, was started on pantoprazole recently from her PCP but not started yet due to fear.  She has mucus in her stools, started on trulance 3 mg but has not taken yet She alternates with constipation and diarrhea, tried linzess but had severe diarrhea with it.  She was having AB pain nightly for last 1-2 months, better with humira.  She has had urinary incontinence and recent fecal incontinence, going to pelvic floor PT for this.  Has had BRB in stool  and on TP, has seen some dark red as well.  She was 215 in July and she is now at 173. I believe the 1/16 was not measuring and carried over.   She has trouble swallowing, feels she is coughing while eating and will cough food up for several hours before.  She states she has coughed up blood with small  amount of food before and has vomited orange liquid as well.  She has had some painful swallowing for last 2 weeks.  No melena.  No NSAIDS, no ETOH, no smoking, no drug use.   Colonoscopy 2018 Dr. Loreta Ave, states had "damage" to colon due to NSAIDS and given IBGard, will try to get records.   Wt Readings from Last 10 Encounters:  04/05/22 173 lb (78.5 kg)  04/03/22 182 lb (82.6 kg)  02/13/22 182 lb 3.2 oz (82.6 kg)  09/27/21 201 lb 9.6 oz (91.4 kg)  07/06/21 209 lb (94.8 kg)  04/17/21 207 lb 3.2 oz (94 kg)  11/17/20 200 lb 6.4 oz (90.9 kg)  07/27/11 207 lb (93.9 kg)  04/20/11 181 lb 3.2 oz (82.2 kg)      She  reports that she quit smoking about 25 years ago. Her smoking use included cigarettes. She does not have any smokeless tobacco history on file. She reports that she does not drink alcohol and does not use drugs.  Current Medications:   Current Outpatient Medications (Endocrine & Metabolic):    levothyroxine (SYNTHROID) 112 MCG tablet, Take 112 mcg by mouth daily before breakfast.   Current Outpatient Medications (Respiratory):    fexofenadine (ALLEGRA ALLERGY) 180 MG tablet, Take 180 mg by mouth daily.    Current Outpatient Medications (Other):    Aflibercept (EYLEA) 2 MG/0.05ML SOSY, by Intravitreal route.   busPIRone (BUSPAR) 15 MG tablet, Take 15 mg by mouth 2 (two) times daily.   cholecalciferol (VITAMIN D3) 25 MCG (1000 UNIT) tablet, Take 5 Units by mouth daily.   dicyclomine (BENTYL) 20 MG tablet, Take 1 tablet (20 mg total) by mouth every 12 (twelve) hours as needed (for abdominal pain/cramping).   lumateperone tosylate (CAPLYTA) 42 MG capsule, Take 42 mg by mouth daily.   pantoprazole (PROTONIX) 40 MG tablet, Take 40 mg by mouth daily.   Plecanatide (TRULANCE) 3 MG TABS, Take 3 mg by mouth daily.  Medical History:  Past Medical History:  Diagnosis Date   Acid reflux    Anxiety    Arthritis    Asthma    Bipolar depression (HCC)    Carpal tunnel syndrome     Depression    Hypothyroid    IBS (irritable bowel syndrome)    Normal pregnancy, first 07/27/2011   Sinusitis    SVD (spontaneous vaginal delivery) 07/28/2011   SVD (spontaneous vaginal delivery) 07/28/2011   Allergies:  Allergies  Allergen Reactions   Prednisone Cough, Other (See Comments), Rash and Shortness Of Breath   Lactose Intolerance (Gi) Diarrhea and Nausea And Vomiting   Morphine And Related Other (See Comments)    Reaction unknown  Bipolar reaction   Amoxicillin Diarrhea and Other (See Comments)   Codeine Other (See Comments)    Reaction: causes bipolar   Dust Mite Extract    Methotrexate Nausea Only   Milk (Cow)    Naproxen Diarrhea   Pollen Extract    Lamotrigine Hives, Other (See Comments) and Rash     Surgical History:  She  has a past surgical history that includes extraction of wisdom teeth. Family History:  Her family history includes Alcohol abuse in her father, mother, and sister; Arthritis in her father, mother, and sister; Asthma in her father and mother; Cancer in her mother; Depression in her father, mother, and sister; Diabetes in her father and mother; Drug abuse in her father, mother, and sister; Heart disease in her father; Hypertension in her father; Kidney disease in her mother; Mental illness in her father and mother.  REVIEW OF SYSTEMS  : All other systems reviewed and negative except where noted in the History of Present Illness.  PHYSICAL EXAM: There were no vitals taken for this visit. General:   Pleasant, well developed female in no acute distress Head:   Normocephalic and atraumatic. Eyes:  sclerae anicteric,conjunctive pink  Heart:   regular rate and rhythm Pulm:  Clear anteriorly; no wheezing Abdomen:   Soft, Obese AB, Sluggish bowel sounds. moderate tenderness in the epigastrium and in the RLQ. Without guarding and Without rebound, No organomegaly appreciated. Rectal: Not evaluated Extremities:  Without edema. Msk: Symmetrical without  gross deformities. Peripheral pulses intact.  Neurologic:  Alert and  oriented x4;  No focal deficits.  Skin:   Dry and intact without significant lesions or rashes. Psychiatric:  Cooperative.anxious mood and affect.  RELEVANT LABS AND IMAGING: CBC    Component Value Date/Time   WBC 11.8 (H) 04/03/2022 2056   RBC 4.66 04/03/2022 2056   HGB 13.1 04/03/2022 2056   HCT 39.4 04/03/2022 2056   PLT 333 04/03/2022 2056   MCV 84.5 04/03/2022 2056   MCH 28.1 04/03/2022 2056   MCHC 33.2 04/03/2022 2056   RDW 13.8 04/03/2022 2056   LYMPHSABS 4.3 (H) 04/03/2022 2056   MONOABS 1.0 04/03/2022 2056   EOSABS 0.1 04/03/2022 2056   BASOSABS 0.1 04/03/2022 2056    CMP     Component Value Date/Time   NA 135 04/03/2022 2056   K 3.4 (L) 04/03/2022 2056   CL 100 04/03/2022 2056   CO2 26 04/03/2022 2056   GLUCOSE 106 (H) 04/03/2022 2056   BUN 11 04/03/2022 2056   CREATININE 0.63 04/03/2022 2056   CALCIUM 9.2 04/03/2022 2056   PROT 7.0 04/03/2022 2056   ALBUMIN 4.3 04/03/2022 2056   AST 21 04/03/2022 2056   ALT 19 04/03/2022 2056   ALKPHOS 48 04/03/2022 2056   BILITOT 0.7 04/03/2022 2056   GFRNONAA >60 04/03/2022 2056     Vladimir Crofts, PA-C 3:32 PM

## 2022-04-04 NOTE — Discharge Instructions (Signed)
Continue your daily prescribed medications.  You may use Bentyl as needed for management of persistent abdominal pain or cramping.  Follow-up with gastroenterology and your primary doctor, as needed.  Return for any new or concerning symptoms.

## 2022-04-04 NOTE — ED Provider Notes (Signed)
[facility]-EMERGENCY DEPT Provider Note   CSN: [id number] Arrival date & time: 04/03/22  2008     History  Chief Complaint  Patient presents with   Abdominal Pain    Stephanie [name] is a 42 y.o. female.  42 year old female with a history of asthma, hypothyroid, rheumatoid arthritis (previously on Humira), and bipolar depression presents to the emergency department for evaluation of abdominal pain.  She has been experiencing intermittent abdominal pain since July.  She describes a cramping sensation which waxes and wanes in severity.  It began acutely this evening at 1730 after receiving a CT scan with oral and IV contrast to further assess the cause of her pain.  Had multiple episodes of watery, nonbloody diarrhea.  Symptoms today not associated with vomiting, urinary symptoms, vaginal complaints, fever.  She states that her abdominal cramping has been spontaneously improving since ED arrival.  No prior abdominal surgeries.  The history is provided by the patient. No language interpreter was used.  Abdominal Pain      Home Medications Prior to Admission medications   Medication Sig Start Date End Date Taking? Authorizing Provider  dicyclomine (BENTYL) 20 MG tablet Take 1 tablet (20 mg total) by mouth every 12 (twelve) hours as needed (for abdominal pain/cramping). 04/04/22  Yes Antony Madura, PA-C  Aflibercept (EYLEA) 2 MG/0.05ML SOSY by Intravitreal route.    [provider]  busPIRone (BUSPAR) 15 MG tablet Take 15 mg by mouth 2 (two) times daily.    [provider]  cholecalciferol (VITAMIN D3) 25 MCG (1000 UNIT) tablet Take 5 Units by mouth daily.    [provider]  fexofenadine (ALLEGRA ALLERGY) 180 MG tablet Take 180 mg by mouth daily.    [provider]  levothyroxine (SYNTHROID) 112 MCG tablet Take 112 mcg by mouth daily before breakfast.    [provider]  lumateperone tosylate (CAPLYTA) 42 MG capsule Take  42 mg by mouth daily.    [provider]  pantoprazole (PROTONIX) 40 MG tablet Take 40 mg by mouth daily.    [provider]  Plecanatide (TRULANCE) 3 MG TABS Take 3 mg by mouth daily.    [provider]      Allergies    Prednisone, Lactose intolerance (gi), Morphine and related, Amoxicillin, Codeine, Dust mite extract, Methotrexate, Milk (cow), Naproxen, Pollen extract, and Lamotrigine    Review of Systems   Review of Systems  Gastrointestinal:  Positive for abdominal pain.  Ten systems reviewed and are negative for acute change, except as noted in the HPI.    Physical Exam Updated Vital Signs BP (!) 123/59 (BP Location: Right Arm)   Pulse 78   Temp 97.7 F (36.5 C) (Oral)   Resp 18   Ht 5\' 3"  (1.6 m)   Wt 82.6 kg   SpO2 97%   BMI 32.24 kg/m   Physical Exam Vitals and nursing note reviewed.  Constitutional:      General: She is not in acute distress.    Appearance: She is well-developed. She is not diaphoretic.     Comments: Obese, nontoxic appearing female  HENT:     Head: Normocephalic and atraumatic.  Eyes:     General: No scleral icterus.    Conjunctiva/sclera: Conjunctivae normal.  Cardiovascular:     Rate and Rhythm: Normal rate and regular rhythm.     Pulses: Normal pulses.  Pulmonary:     Effort: Pulmonary effort is normal. No respiratory distress.  Comments: Respirations even and unlabored Abdominal:     General: There is no distension.     Palpations: Abdomen is soft. There is no mass.     Comments: Soft, nondistended, obese abdomen. No focal TTP or peritoneal signs.  Musculoskeletal:        General: Normal range of motion.     Cervical back: Normal range of motion.  Skin:    General: Skin is warm and dry.     Coloration: Skin is not pale.     Findings: No erythema or rash.  Neurological:     Mental Status: She is alert and oriented to person, place, and time.     Coordination: Coordination normal.  Psychiatric:         Behavior: Behavior normal.     ED Results / Procedures / Treatments   Labs (all labs ordered are listed, but only abnormal results are displayed) Labs Reviewed  COMPREHENSIVE METABOLIC PANEL - Abnormal; Notable for the following components:      Result Value   Potassium 3.4 (*)    Glucose, Bld 106 (*)    All other components within normal limits  URINALYSIS, ROUTINE W REFLEX MICROSCOPIC - Abnormal; Notable for the following components:   Color, Urine STRAW (*)    All other components within normal limits  CBC WITH DIFFERENTIAL/PLATELET - Abnormal; Notable for the following components:   WBC 11.8 (*)    Lymphs Abs 4.3 (*)    All other components within normal limits  LIPASE, BLOOD  PREGNANCY, URINE    EKG None  Radiology CT ABDOMEN PELVIS W CONTRAST  Result Date: 04/03/2022 CLINICAL DATA:  Urinary incontinence.  Right abdominal pain. EXAM: CT ABDOMEN AND PELVIS WITH CONTRAST TECHNIQUE: Multidetector CT imaging of the abdomen and pelvis was performed using the standard protocol following bolus administration of intravenous contrast. RADIATION DOSE REDUCTION: This exam was performed according to the departmental dose-optimization program which includes automated exposure control, adjustment of the mA and/or kV according to patient size and/or use of iterative reconstruction technique. CONTRAST:  ISOVUE-300 IOPAMIDOL (ISOVUE-300) INJECTION 61% COMPARISON:  CT November 12, 2016 FINDINGS: Lower chest: No acute abnormality. Small hiatal hernia with mild symmetric distal esophageal wall thickening. Hepatobiliary: No suspicious hepatic lesion. Gallbladder is unremarkable. No biliary ductal dilation. Pancreas: Pancreatic ductal dilation or evidence of acute inflammation. Spleen: No splenomegaly. Adrenals/Urinary Tract: Bilateral adrenal glands appear normal. No hydronephrosis. Left upper/interpolar renal cortical calcification located in an area of cortical scarring. Urinary bladder is  unremarkable for degree of distension. Stomach/Bowel: Stomach is unremarkable for degree of distension. No pathologic dilation of small or large bowel. The appendix and terminal ileum appear normal. Moderate volume of formed stool in the colon. No evidence of acute bowel inflammation. Vascular/Lymphatic: Normal caliber abdominal aorta. Aortic atherosclerosis. Smooth IVC contours. No pathologically enlarged abdominal or pelvic lymph nodes. Reproductive: No suspicious adnexal mass. Intrauterine device appears appropriate in positioning. Slight heterogeneity of the endocervical canal with fluid in the canal. Other: Trace pelvic free fluid is within physiologic normal limits. Musculoskeletal: L5-S1 discogenic disease with Modic type endplate changes. IMPRESSION: 1. No acute abnormality in the abdomen or pelvis. 2. Small hiatal hernia with mild symmetric distal esophageal wall thickening, which may reflect esophagitis. 3. Slight heterogeneity of the endocervical canal with fluid in the canal, which is nonspecific but may be related to patient's menstrual cycle. Consider further evaluation with direct visualization if clinically indicated. 4. Left upper/interpolar renal cortical calcification located in an area of cortical  scarring, compatible with sequela of prior infection/inflammation. 5.  Aortic Atherosclerosis (ICD10-I70.0). Electronically Signed   By: Dahlia Bailiff M.D.   On: 04/03/2022 17:37    Procedures Procedures    Medications Ordered in ED Medications  ondansetron (ZOFRAN-ODT) disintegrating tablet 4 mg (4 mg Oral Given 04/03/22 2138)  dicyclomine (BENTYL) capsule 10 mg (10 mg Oral Given 04/04/22 0415)    ED Course/ Medical Decision Making/ A&P                             Medical Decision Making Amount and/or Complexity of Data Reviewed Labs: ordered.  Risk Prescription drug management.   This patient presents to the ED for concern of abdominal pain and diarrhea, this involves an  extensive number of treatment options, and is a complaint that carries with it a high risk of complications and morbidity.  The differential diagnosis includes IBS vs IBD vs viral illness vs diverticulitis vs UTI vs C diff colitis.   Co morbidities that complicate the patient evaluation  RA Asthma Bipolar depression   Additional history obtained:  External records from outside source obtained and reviewed including CT abdomen/pelvis from yesterday afternoon - negative for acute intraabdominal or pelvic process.   Lab Tests:  I Ordered, and personally interpreted labs.  The pertinent results include:  Leukocytosis of 11.8 (c/w baseline). Otherwise normal CMP, lipase, UA. Pregnancy negative.   Cardiac Monitoring:  The patient was maintained on a cardiac monitor.  I personally viewed and interpreted the cardiac monitored which showed an underlying rhythm of: NSR   Medicines ordered and prescription drug management:  I ordered medication including Bentyl for abdominal cramping  Reevaluation of the patient after these medicines showed that the patient stayed the same I have reviewed the patients home medicines and have made adjustments as needed   Problem List / ED Course:  Patient has been experiencing ongoing abdominal pain since July.  She had an episode of this pain today which began after completion of CT abdomen/pelvis.  This was ordered as an outpatient study for further evaluation of her symptoms. Patient's abdominal pelvic CT does not reveal any surgical, infectious, or other emergent pathology.  Given symptom chronicity, IBS and IBD remain on the differential; however, imaging findings do not suggest active Crohn's or ulcerative colitis at this time.  Will benefit from ongoing outpatient gastroenterology follow-up. Labs including CBC, CMP, lipase are unremarkable.  Urinalysis negative for UTI. Patient was treated with Bentyl for symptomatic management.  Will trial on  outpatient course to use as needed pending GI follow-up.   Reevaluation:  After the interventions noted above, I reevaluated the patient and found that they have :stayed the same   Social Determinants of Health:  Insured patient   Dispostion:  After consideration of the diagnostic results and the patients response to treatment, I feel that the patent would benefit from outpatient gastroenterology follow-up.  Given prescription for Bentyl to take for management of abdominal cramping, as needed.  Return precautions discussed and provided. Patient discharged in stable condition with no unaddressed concerns.          Final Clinical Impression(s) / ED Diagnoses Final diagnoses:  Chronic abdominal pain    Rx / DC Orders ED Discharge Orders          Ordered    dicyclomine (BENTYL) 20 MG tablet  Every 12 hours PRN        04/04/22 0400  Antonietta Breach, PA-C 04/04/22 [place], MD 04/04/22 [phone]

## 2022-04-05 ENCOUNTER — Encounter: Payer: Self-pay | Admitting: Physician Assistant

## 2022-04-05 ENCOUNTER — Ambulatory Visit (INDEPENDENT_AMBULATORY_CARE_PROVIDER_SITE_OTHER): Payer: Managed Care, Other (non HMO) | Admitting: Physician Assistant

## 2022-04-05 ENCOUNTER — Other Ambulatory Visit (INDEPENDENT_AMBULATORY_CARE_PROVIDER_SITE_OTHER): Payer: Managed Care, Other (non HMO)

## 2022-04-05 ENCOUNTER — Telehealth: Payer: Self-pay | Admitting: Physician Assistant

## 2022-04-05 VITALS — HR 104 | Ht 63.0 in | Wt 173.0 lb

## 2022-04-05 DIAGNOSIS — K209 Esophagitis, unspecified without bleeding: Secondary | ICD-10-CM

## 2022-04-05 DIAGNOSIS — K625 Hemorrhage of anus and rectum: Secondary | ICD-10-CM

## 2022-04-05 DIAGNOSIS — K2100 Gastro-esophageal reflux disease with esophagitis, without bleeding: Secondary | ICD-10-CM

## 2022-04-05 DIAGNOSIS — L4050 Arthropathic psoriasis, unspecified: Secondary | ICD-10-CM

## 2022-04-05 DIAGNOSIS — R198 Other specified symptoms and signs involving the digestive system and abdomen: Secondary | ICD-10-CM

## 2022-04-05 DIAGNOSIS — R1310 Dysphagia, unspecified: Secondary | ICD-10-CM | POA: Diagnosis not present

## 2022-04-05 LAB — COMPREHENSIVE METABOLIC PANEL
ALT: 14 U/L (ref 0–35)
AST: 16 U/L (ref 0–37)
Albumin: 4.6 g/dL (ref 3.5–5.2)
Alkaline Phosphatase: 57 U/L (ref 39–117)
BUN: 11 mg/dL (ref 6–23)
CO2: 29 mEq/L (ref 19–32)
Calcium: 9.3 mg/dL (ref 8.4–10.5)
Chloride: 103 mEq/L (ref 96–112)
Creatinine, Ser: 0.68 mg/dL (ref 0.40–1.20)
GFR: 106.48 mL/min (ref >60.00)
Glucose, Bld: 103 mg/dL — ABNORMAL HIGH (ref 70–99)
Potassium: 3.4 mEq/L — ABNORMAL LOW (ref 3.5–5.1)
Sodium: 140 mEq/L (ref 135–145)
Total Bilirubin: 0.3 mg/dL (ref 0.2–1.2)
Total Protein: 7.6 g/dL (ref 6.0–8.3)

## 2022-04-05 LAB — CBC WITH DIFFERENTIAL/PLATELET
Basophils Absolute: 0.1 10*3/uL (ref 0.0–0.1)
Basophils Relative: 1.1 % (ref 0.0–3.0)
Eosinophils Absolute: 0.1 10*3/uL (ref 0.0–0.7)
Eosinophils Relative: 1.1 % (ref 0.0–5.0)
HCT: 40.7 % (ref 36.0–46.0)
Hemoglobin: 13.5 g/dL (ref 12.0–15.0)
Lymphocytes Relative: 36.5 % (ref 12.0–46.0)
Lymphs Abs: 3.6 10*3/uL (ref 0.7–4.0)
MCHC: 33 g/dL (ref 30.0–36.0)
MCV: 85.4 fl (ref 78.0–100.0)
Monocytes Absolute: 0.8 10*3/uL (ref 0.1–1.0)
Monocytes Relative: 7.7 % (ref 3.0–12.0)
Neutro Abs: 5.4 10*3/uL (ref 1.4–7.7)
Neutrophils Relative %: 53.6 % (ref 43.0–77.0)
Platelets: 335 10*3/uL (ref 150.0–400.0)
RBC: 4.77 Mil/uL (ref 3.87–5.11)
RDW: 14.3 % (ref 11.5–15.5)
WBC: 10 10*3/uL (ref 4.0–10.5)

## 2022-04-05 LAB — TSH: TSH: 0.53 u[IU]/mL (ref 0.35–5.50)

## 2022-04-05 MED ORDER — PANTOPRAZOLE SODIUM 20 MG PO TBEC: 20.0000 mg | DELAYED_RELEASE_TABLET | Freq: Every day | ORAL | 3 refills | Status: DC

## 2022-04-05 MED ORDER — PLENVU 140 G PO SOLR: 1.0000 | ORAL | 0 refills | Status: DC

## 2022-04-05 NOTE — Telephone Encounter (Signed)
Patient is calling wondering at what times is she supposed to take her new Trulance Rx. Please advised

## 2022-04-05 NOTE — Progress Notes (Signed)
____________________________________________________________  Attending physician addendum:  Thank you for sending this case to me. I have reviewed the entire note and agree with the plan (with the following changes)  Since we are doing EGD and colonoscopy, you do not need to get a fecal calprotectin.  That test is for use following patients with known IBD.  If endoscopic testing and biopsies are unrevealing, this overall clinical picture is most consistent with a functional bowel disorder.  Subsequent SIBO testing would be warranted.  Wilfrid Lund, MD  ____________________________________________________________

## 2022-04-05 NOTE — Patient Instructions (Addendum)
Your provider has requested that you go to the basement level for lab work before leaving today. Press "B" on the elevator. The lab is located at the first door on the left as you exit the elevator.   [place] for the pantoprazole 20 mg daily Please take this medication 30 minutes to 1 hour before meals- this makes it more effective.  Avoid spicy and acidic foods Avoid fatty foods Limit your intake of coffee, tea, alcohol, and carbonated drinks Work to maintain a healthy weight Keep the head of the bed elevated at least 3 inches with blocks or a wedge pillow if you are having any nighttime symptoms Stay upright for 2 hours after eating Avoid meals and snacks three to four hours before bedtime  Suggest getting shingles vaccine.   Start the trulance and see how you do Dicyclomine as needed.   - Drink at least 64-80 ounces of water/liquid per day. - Establish a time to try to move your bowels every day.  For many people, this is after a cup of coffee or after a meal such as breakfast. - Sit all of the way back on the toilet keeping your back fairly straight and while sitting up, try to rest the tops of your forearms on your upper thighs.   - Raising your feet with a step stool/squatty potty can be helpful to improve the angle that allows your stool to pass through the rectum. - Relax the rectum feeling it bulge toward the toilet water.  If you feel your rectum raising toward your body, you are contracting rather than relaxing. - Breathe in and slowly exhale. "Belly breath" by expanding your belly towards your belly button. Keep belly expanded as you gently direct pressure down and back to the anus.  A low pitched GRRR sound can assist with increasing intra-abdominal pressure.  - Repeat 3-4 times. If unsuccessful, contract the pelvic floor to restore normal tone and get off the toilet.  Avoid excessive straining. - To reduce excessive wiping by teaching your anus to normally contract, place hands  on outer aspect of knees and resist knee movement outward.  Hold 5-10 second then place hands just inside of knees and resist inward movement of knees.  Hold 5 seconds.  Repeat a few times each way.  Go to the ER if unable to pass gas, severe AB pain, unable to hold down food, any shortness of breath of chest pain.    FODMAP stands for fermentable oligo-, di-, mono-saccharides and polyols (1). These are the scientific terms used to classify groups of carbs that are notorious for triggering digestive symptoms like bloating, gas and stomach pain.     You have been scheduled for an endoscopy and colonoscopy. Please follow the written instructions given to you at your visit today. Please pick up your prep supplies at the pharmacy within the next 1-3 days. If you use inhalers (even only as needed), please bring them with you on the day of your procedure.   I appreciate the opportunity to care for you. Vicie Mutters, PA-C

## 2022-04-06 ENCOUNTER — Other Ambulatory Visit: Payer: Managed Care, Other (non HMO)

## 2022-04-06 DIAGNOSIS — K209 Esophagitis, unspecified without bleeding: Secondary | ICD-10-CM

## 2022-04-06 DIAGNOSIS — K2100 Gastro-esophageal reflux disease with esophagitis, without bleeding: Secondary | ICD-10-CM

## 2022-04-06 DIAGNOSIS — K625 Hemorrhage of anus and rectum: Secondary | ICD-10-CM

## 2022-04-06 LAB — TISSUE TRANSGLUTAMINASE, IGA: (tTG) Ab, IgA: <1 U/mL

## 2022-04-06 LAB — IGA: Immunoglobulin A: 102 mg/dL (ref 47–310)

## 2022-04-06 NOTE — Telephone Encounter (Signed)
Spoke with patient regarding medications she was prescribed & all questions were answered. Advised her to let us know if symptoms are not improving on Trulance.

## 2022-04-06 NOTE — Telephone Encounter (Signed)
Left message for patient to call back.

## 2022-04-10 LAB — CALPROTECTIN, FECAL: Calprotectin, Fecal: 396 ug/g — ABNORMAL HIGH (ref 0–120)

## 2022-04-13 ENCOUNTER — Telehealth: Payer: Self-pay | Admitting: Physician Assistant

## 2022-04-13 NOTE — Telephone Encounter (Signed)
Discussed with pt that Trulance is the med that is prob causing her diarrhea. Pt will hold it for a few days and try it again to see how she does with the mdications.

## 2022-04-13 NOTE — Telephone Encounter (Signed)
Inbound call from patient requesting a phone call from a nurse , patient have a couple question regarding 2 medication she is taking that are causing her diarrhea "Pantroprazole" Trulance"..Please advise

## 2022-04-19 ENCOUNTER — Ambulatory Visit: Payer: Managed Care, Other (non HMO) | Attending: Family Medicine

## 2022-04-19 DIAGNOSIS — M62838 Other muscle spasm: Secondary | ICD-10-CM | POA: Insufficient documentation

## 2022-04-19 DIAGNOSIS — M5459 Other low back pain: Secondary | ICD-10-CM | POA: Diagnosis present

## 2022-04-19 DIAGNOSIS — N3946 Mixed incontinence: Secondary | ICD-10-CM | POA: Insufficient documentation

## 2022-04-19 DIAGNOSIS — R279 Unspecified lack of coordination: Secondary | ICD-10-CM | POA: Insufficient documentation

## 2022-04-19 DIAGNOSIS — R293 Abnormal posture: Secondary | ICD-10-CM | POA: Insufficient documentation

## 2022-04-19 DIAGNOSIS — M6281 Muscle weakness (generalized): Secondary | ICD-10-CM | POA: Insufficient documentation
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
## 2022-04-26 ENCOUNTER — Ambulatory Visit: Payer: Managed Care, Other (non HMO)

## 2022-04-26 DIAGNOSIS — M6281 Muscle weakness (generalized): Secondary | ICD-10-CM

## 2022-04-26 DIAGNOSIS — M5459 Other low back pain: Secondary | ICD-10-CM | POA: Diagnosis not present

## 2022-04-26 DIAGNOSIS — R279 Unspecified lack of coordination: Secondary | ICD-10-CM

## 2022-04-26 DIAGNOSIS — M62838 Other muscle spasm: Secondary | ICD-10-CM

## 2022-04-26 DIAGNOSIS — R293 Abnormal posture: Secondary | ICD-10-CM
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
## 2022-04-30 ENCOUNTER — Encounter: Payer: Self-pay | Admitting: Gastroenterology

## 2022-04-30 ENCOUNTER — Ambulatory Visit (AMBULATORY_SURGERY_CENTER): Payer: Managed Care, Other (non HMO) | Admitting: Gastroenterology

## 2022-04-30 VITALS — BP 102/55 | HR 79 | Temp 97.1°F | Resp 20 | Ht 63.0 in | Wt 173.0 lb

## 2022-04-30 DIAGNOSIS — K2100 Gastro-esophageal reflux disease with esophagitis, without bleeding: Secondary | ICD-10-CM | POA: Diagnosis present

## 2022-04-30 DIAGNOSIS — K5730 Diverticulosis of large intestine without perforation or abscess without bleeding: Secondary | ICD-10-CM | POA: Diagnosis not present

## 2022-04-30 DIAGNOSIS — K625 Hemorrhage of anus and rectum: Secondary | ICD-10-CM

## 2022-04-30 DIAGNOSIS — K2950 Unspecified chronic gastritis without bleeding: Secondary | ICD-10-CM | POA: Diagnosis not present

## 2022-04-30 DIAGNOSIS — K2289 Other specified disease of esophagus: Secondary | ICD-10-CM | POA: Diagnosis not present

## 2022-04-30 DIAGNOSIS — R1310 Dysphagia, unspecified: Secondary | ICD-10-CM

## 2022-04-30 DIAGNOSIS — R1084 Generalized abdominal pain: Secondary | ICD-10-CM

## 2022-04-30 DIAGNOSIS — R1319 Other dysphagia: Secondary | ICD-10-CM

## 2022-04-30 DIAGNOSIS — R194 Change in bowel habit: Secondary | ICD-10-CM

## 2022-04-30 DIAGNOSIS — R12 Heartburn: Secondary | ICD-10-CM

## 2022-04-30 MED ORDER — SODIUM CHLORIDE 0.9 % IV SOLN: 500.0000 mL | Freq: Once | INTRAVENOUS | Status: DC

## 2022-04-30 NOTE — Progress Notes (Signed)
Called to room to assist during endoscopic procedure.  Patient ID and intended procedure confirmed with present staff. Received instructions for my participation in the procedure from the performing physician.  

## 2022-04-30 NOTE — Progress Notes (Signed)
No changes to clinical history since GI office visit on 04/05/22.  The patient is appropriate for an endoscopic procedure in the ambulatory setting.  - Wilfrid Lund, MD

## 2022-04-30 NOTE — Op Note (Signed)
[facility] Patient Name: Stephanie [name] Procedure Date: 04/30/2022 3:10 PM MRN: [id number] Endoscopist: Mallie Mussel L. Loletha Carrow , MD, [id number] Age: 42 Referring MD:  Date of Birth: 07/07/1980 Gender: Female Account #: [id number] Procedure:                Colonoscopy Indications:              Generalized abdominal pain, Rectal bleeding,                            Constipation, Chronic diarrhea Medicines:                Monitored Anesthesia Care Procedure:                Pre-Anesthesia Assessment:                           - Prior to the procedure, a History and Physical                            was performed, and patient medications and                            allergies were reviewed. The patient's tolerance of                            previous anesthesia was also reviewed. The risks                            and benefits of the procedure and the sedation                            options and risks were discussed with the patient.                            All questions were answered, and informed consent                            was obtained. Prior Anticoagulants: The patient has                            taken no anticoagulant or antiplatelet agents. ASA                            Grade Assessment: II - A patient with mild systemic                            disease. After reviewing the risks and benefits,                            the patient was deemed in satisfactory condition to                            undergo the procedure.  After obtaining informed consent, the colonoscope                            was passed under direct vision. Throughout the                            procedure, the patient's blood pressure, pulse, and                            oxygen saturations were monitored continuously. The                            Olympus SN [id number] was introduced through the anus                            and advanced to the the  terminal ileum, with                            identification of the appendiceal orifice and IC                            valve. The colonoscopy was performed without                            difficulty. The patient tolerated the procedure                            well. The quality of the bowel preparation was                            excellent. The terminal ileum, ileocecal valve,                            appendiceal orifice, and rectum were photographed.                            The bowel preparation used was Plenvu via split                            dose instruction. Scope In: 3:12:14 PM Scope Out: 3:22:49 PM Scope Withdrawal Time: 0 hours 7 minutes 9 seconds  Total Procedure Duration: 0 hours 10 minutes 35 seconds  Findings:                 The perianal and digital rectal examinations were                            normal.                           The terminal ileum appeared normal.                           Normal mucosa was found in the entire colon.  Biopsies for histology were taken with a cold                            forceps from the ascending colon, transverse colon                            and descending colon for evaluation of microscopic                            colitis.                           Repeat examination of right colon under NBI                            performed.                           A few small-mouthed diverticula were found in the                            left colon.                           The exam was otherwise without abnormality on                            direct and retroflexion views. Complications:            No immediate complications. Estimated Blood Loss:     Estimated blood loss was minimal. Impression:               - The examined portion of the ileum was normal.                           - Normal mucosa in the entire examined colon.                            Biopsied.                            - Diverticulosis in the left colon.                           - The examination was otherwise normal on direct                            and retroflexion views. Recommendation:           - Patient has a contact number available for                            emergencies. The signs and symptoms of potential                            delayed complications were discussed with the  patient. Return to normal activities tomorrow.                            Written discharge instructions were provided to the                            patient.                           - Resume previous diet.                           - Continue present medications.                           - Await pathology results.                           - Repeat colonoscopy in 10 years for screening                            purposes.                           - See the other procedure note for documentation of                            additional recommendations. Ramsie Ostrander L. Loletha Carrow, MD 04/30/2022 3:27:33 PM This report has been signed electronically.

## 2022-04-30 NOTE — Patient Instructions (Signed)
-  Resume previous diet. - Continue present medications. - Await pathology results. - Repeat colonoscopy in 10 years for screening purposes.  YOU HAD AN ENDOSCOPIC PROCEDURE TODAY AT [facility]:   Refer to the procedure report that was given to you for any specific questions about what was found during the examination.  If the procedure report does not answer your questions, please call your gastroenterologist to clarify.  If you requested that your care partner not be given the details of your procedure findings, then the procedure report has been included in a sealed envelope for you to review at your convenience later.  YOU SHOULD EXPECT: Some feelings of bloating in the abdomen. Passage of more gas than usual.  Walking can help get rid of the air that was put into your GI tract during the procedure and reduce the bloating. If you had a lower endoscopy (such as a colonoscopy or flexible sigmoidoscopy) you may notice spotting of blood in your stool or on the toilet paper. If you underwent a bowel prep for your procedure, you may not have a normal bowel movement for a few days.  Please Note:  You might notice some irritation and congestion in your nose or some drainage.  This is from the oxygen used during your procedure.  There is no need for concern and it should clear up in a day or so.  SYMPTOMS TO REPORT IMMEDIATELY:  Following lower endoscopy (colonoscopy or flexible sigmoidoscopy):  Excessive amounts of blood in the stool  Significant tenderness or worsening of abdominal pains  Swelling of the abdomen that is new, acute  Fever of 100F or higher  Following upper endoscopy (EGD)  Vomiting of blood or coffee ground material  New chest pain or pain under the shoulder blades  Painful or persistently difficult swallowing  New shortness of breath  Fever of 100F or higher  Black, tarry-looking stools  For urgent or emergent issues, a gastroenterologist can be reached at  any hour by calling [phone]. Do not use MyChart messaging for urgent concerns.    DIET:  We do recommend a small meal at first, but then you may proceed to your regular diet.  Drink plenty of fluids but you should avoid alcoholic beverages for 24 hours.  ACTIVITY:  You should plan to take it easy for the rest of today and you should NOT DRIVE or use heavy machinery until tomorrow (because of the sedation medicines used during the test).    FOLLOW UP: Our staff will call the number listed on your records the next business day following your procedure.  We will call around 7:15- 8:00 am to check on you and address any questions or concerns that you may have regarding the information given to you following your procedure. If we do not reach you, we will leave a message.     If any biopsies were taken you will be contacted by phone or by letter within the next 1-3 weeks.  Please call us at [phone] if you have not heard about the biopsies in 3 weeks.    SIGNATURES/CONFIDENTIALITY: You and/or your care partner have signed paperwork which will be entered into your electronic medical record.  These signatures attest to the fact that that the information above on your After Visit Summary has been reviewed and is understood.  Full responsibility of the confidentiality of this discharge information lies with you and/or your care-partner.

## 2022-04-30 NOTE — Progress Notes (Signed)
VS by DT  Pt's states no medical or surgical changes since previsit or office visit.  

## 2022-04-30 NOTE — Op Note (Signed)
[facility] Patient Name: Mckayle Brei Procedure Date: 04/30/2022 3:09 PM MRN: [id number] Endoscopist: Mallie Mussel L. Loletha Carrow , MD, [id number] Age: 42 Referring MD:  Date of Birth: 12/09/1980 Gender: Female Account #: [id number] Procedure:                Upper GI endoscopy Indications:              Generalized abdominal pain, Esophageal dysphagia,                            Heartburn Medicines:                Monitored Anesthesia Care Procedure:                Pre-Anesthesia Assessment:                           - Prior to the procedure, a History and Physical                            was performed, and patient medications and                            allergies were reviewed. The patient's tolerance of                            previous anesthesia was also reviewed. The risks                            and benefits of the procedure and the sedation                            options and risks were discussed with the patient.                            All questions were answered, and informed consent                            was obtained. Prior Anticoagulants: The patient has                            taken no anticoagulant or antiplatelet agents. ASA                            Grade Assessment: II - A patient with mild systemic                            disease. After reviewing the risks and benefits,                            the patient was deemed in satisfactory condition to                            undergo the procedure.  After obtaining informed consent, the endoscope was                            passed under direct vision. Throughout the                            procedure, the patient's blood pressure, pulse, and                            oxygen saturations were monitored continuously. The                            GIF [id number] was introduced through the                            mouth, and advanced to the second part of  duodenum.                            The upper GI endoscopy was accomplished without                            difficulty. The patient tolerated the procedure                            well. Scope In: Scope Out: Findings:                 Normal mucosa was found in the entire esophagus.                            Biopsies were taken with a cold forceps for                            histology (distal esophagus-rule out microscopic                            changes of GERD or EOE).                           A widely patent Schatzki ring was found at the                            gastroesophageal junction.                           A small hiatal hernia was present.                           Normal mucosa was found in the entire examined                            stomach. Several biopsies were obtained on the                            greater curvature of the gastric body, on the  lesser curvature of the gastric body, on the                            greater curvature of the gastric antrum and on the                            lesser curvature of the gastric antrum with cold                            forceps for histology.                           The cardia and gastric fundus were normal on                            retroflexion.                           Normal mucosa was found in the entire duodenum.                            Biopsies for histology were taken with a cold                            forceps for evaluation of celiac disease. Complications:            No immediate complications. Estimated Blood Loss:     Estimated blood loss was minimal. Impression:               - Normal mucosa was found in the entire esophagus.                            Biopsied.                           - Widely patent Schatzki ring.                           - Small hiatal hernia.                           - Normal mucosa was found in the entire stomach.                            - Normal mucosa was found in the entire examined                            duodenum. Biopsied.                           - Several biopsies were obtained on the greater                            curvature of the gastric body, on the lesser  curvature of the gastric body, on the greater                            curvature of the gastric antrum and on the lesser                            curvature of the gastric antrum.                           If all biopsies normal, overall clinical picture                            most consistent with a functional bowel                            disorder/IBS, though an empiric trial of rifaximin                            for possible SIBO warranted. Recommendation:           - Patient has a contact number available for                            emergencies. The signs and symptoms of potential                            delayed complications were discussed with the                            patient. Return to normal activities tomorrow.                            Written discharge instructions were provided to the                            patient.                           - Resume previous diet.                           - Continue present medications.                           - Await pathology results.                           - See the other procedure note for documentation of                            additional recommendations. Cara Thaxton L. Loletha Carrow, MD 04/30/2022 3:45:10 PM This report has been signed electronically.

## 2022-04-30 NOTE — Progress Notes (Signed)
A and O x3. Report to RN. Tolerated MAC anesthesia well.Teeth unchanged after procedure. 

## 2022-05-01 ENCOUNTER — Ambulatory Visit (INDEPENDENT_AMBULATORY_CARE_PROVIDER_SITE_OTHER): Payer: Managed Care, Other (non HMO) | Admitting: Neurology

## 2022-05-01 ENCOUNTER — Encounter: Payer: Self-pay | Admitting: Neurology

## 2022-05-01 ENCOUNTER — Telehealth: Payer: Self-pay

## 2022-05-01 VITALS — BP 122/83 | HR 77 | Ht 63.0 in | Wt 182.0 lb

## 2022-05-01 DIAGNOSIS — R202 Paresthesia of skin: Secondary | ICD-10-CM | POA: Diagnosis not present

## 2022-05-01 DIAGNOSIS — G8929 Other chronic pain: Secondary | ICD-10-CM | POA: Diagnosis not present

## 2022-05-01 DIAGNOSIS — M25551 Pain in right hip: Secondary | ICD-10-CM | POA: Diagnosis not present

## 2022-05-01 DIAGNOSIS — M533 Sacrococcygeal disorders, not elsewhere classified: Secondary | ICD-10-CM

## 2022-05-01 NOTE — Telephone Encounter (Signed)
No answer, left message to call if having any issues or concerns, B.Earmon Sherrow RN 

## 2022-05-01 NOTE — Progress Notes (Signed)
Chief Complaint  Patient presents with   New Patient (Initial Visit)    Rm 12. Patient with friend. Patient is complaining of RIGHT leg NOT left. Patient reports she has had nerve study at [facility], patient has been on humera and right leg comes and goes with numbness and tingling. Having trouble with driving and daily activities due to the leg weakness. Also reports bout of same troubles over three years ago but then nothing until now.       ASSESSMENT AND PLAN  Stephanie [name] is a 42 y.o. female   Worsening low back pain, right hip pain,  Essentially normal neurological examinations,  Most suggestive of musculoskeletal etiology  X-ray of right hip,  Laboratory evaluation for inflammatory markers  EMG nerve conduction study to rule out right lumbar radiculopathy/right lower extremity neuropathy   DIAGNOSTIC DATA (LABS, IMAGING, TESTING) - I reviewed patient records, labs, notes, testing and imaging myself where available.   MEDICAL HISTORY:  Stephanie [name], is a 42 year old female seen in request by neurosurgeon Dr.   Ashok Pall for evaluation of right hip pain, abnormal EMG nerve conduction study, difficulty using her right leg, she is accompanied by her friend at today's visit on May 01, 2022, her primary care physician is Dr.Dewey, Mechele Claude, MD   I reviewed and summarized the referring note.PMHx. 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, moving her mother, already develop low back pain, abdominal discomfort since July 2023, but pushed through, symptoms getting worse by September 2023, low back pain,  especially right hip area pain was so severe, to the point it is hard for her to go through her day  She initially see [facility] for her complaints, was given steroid Dosepak, complains whole body turned red, bronchospasm, also was getting steroid inhaler for chronic coughing, make her symptoms worse  She has long history of urinary urgency, getting worse with her worsening low back pain, stress incontinence with cough,  She had a EMG nerve conduction study of bilateral upper extremity at [facility], there was reported multiple abnormalities, was given the diagnosis of severe right carpal tunnel syndrome,  She also had MRI of lumbar spine at [facility] in October 2023, described multiple level lumbar spondylosis, worst at L5-S1, mild to moderate bilateral foraminal stenosis, no evidence of cord compression     PHYSICAL EXAM:   Vitals:   05/01/22 1546  BP: 122/83  Pulse: 77  Weight: 182 lb (82.6 kg)  Height: 5' 3"$  (1.6 m)   Not recorded     Body mass index is 32.24 kg/m.  PHYSICAL EXAMNIATION:  Gen: NAD, conversant, well nourised, well groomed                     Cardiovascular: Regular rate rhythm, no peripheral edema, warm, nontender. Eyes: Conjunctivae clear without exudates or hemorrhage Neck: Supple, no carotid bruits. Pulmonary: Clear to auscultation bilaterally   NEUROLOGICAL EXAM:  MENTAL STATUS: Speech/cognition: Awake, alert, oriented to history taking and casual conversation CRANIAL NERVES: CN II: Visual fields are full to confrontation. Pupils are round equal and briskly reactive to light. CN III, IV, VI: extraocular movement are normal. No ptosis. CN  V: Facial sensation is intact to light touch CN VII: Face is symmetric with normal eye closure  CN VIII: Hearing is normal to causal conversation. CN IX, X: Phonation is normal. CN XI: Head turning and shoulder shrug are intact  MOTOR: There is no pronator drift of out-stretched arms. Muscle bulk and  tone are normal. Muscle strength is normal.  Crossed leg exam induce right hip pain,  REFLEXES: Reflexes are 2+ and symmetric at the biceps, triceps, knees, and ankles. Plantar responses are flexor.  SENSORY: Intact to light touch, pinprick and vibratory sensation are intact in fingers and toes.  COORDINATION: There is no trunk or limb dysmetria noted.  GAIT/STANCE: Need push-up to get up from seated position, mildly antalgic,  REVIEW OF SYSTEMS:  Full 14 system review of systems performed and notable only for as above All other review of systems were negative.   ALLERGIES: Allergies  Allergen Reactions   Prednisone Shortness Of Breath, Rash, Other (See Comments) and Cough    Any steroids   Lactose Intolerance (Gi) Diarrhea and Nausea And Vomiting   Morphine And Related Other (See Comments)    Reaction unknown  Bipolar reaction   Amoxicillin Diarrhea and Other (See Comments)   Codeine Other (See Comments)    Reaction: causes bipolar   Dust Mite Extract    Methotrexate Nausea Only   Milk (Cow)    Naproxen Diarrhea   Pollen Extract    Lamotrigine Hives, Other (See Comments) and Rash    HOME MEDICATIONS: Current Outpatient Medications  Medication Sig Dispense Refill   Aflibercept (EYLEA) 2 MG/0.05ML SOSY by Intravitreal route.     AMBULATORY NON FORMULARY MEDICATION Medication Name: gabapentin 6%/Lido5%/Dicl3% Sig: apply to the affected area topically three times a day as needed for pain     busPIRone (BUSPAR) 15 MG tablet Take 7.5 mg by mouth 2 (two) times daily.     cholecalciferol (VITAMIN D3) 25 MCG (1000 UNIT) tablet Take 5 Units by mouth daily.     dicyclomine (BENTYL) 20 MG tablet Take 1 tablet (20 mg total) by mouth every 12 (twelve) hours as needed (for abdominal pain/cramping). 14 tablet 0   fexofenadine (ALLEGRA ALLERGY) 180 MG tablet Take 180 mg by mouth daily.     FLUoxetine (PROZAC) 20 MG capsule Take 20 mg by mouth daily.     levonorgestrel (MIRENA, 52  MG,) 20 MCG/DAY IUD Take 1 device by intrauterine route.     levothyroxine (SYNTHROID) 100 MCG tablet Take 100 mcg by mouth once a week.     levothyroxine (SYNTHROID) 112 MCG tablet Take 112 mcg by mouth daily before breakfast.     ofloxacin (OCUFLOX) 0.3 % ophthalmic solution Apply to eye.     OLANZapine (ZYPREXA) 10 MG tablet Take 10 mg by mouth at bedtime.     pantoprazole (PROTONIX) 20 MG tablet Take 1 tablet (20 mg total) by mouth daily before breakfast. 30 tablet 3   OLANZapine-Samidorphan (LYBALVI) 5-10 MG TABS Take 1 tablet by mouth daily. (Patient not taking: Reported on 04/30/2022)     Plecanatide (TRULANCE) 3 MG TABS Take 3 mg by mouth daily. (Patient not taking: Reported on 04/05/2022)     RINVOQ 15 MG TB24 Take 1 tablet by mouth daily. (Patient not taking: Reported on 04/30/2022)     No current facility-administered medications for this visit.    PAST MEDICAL HISTORY: Past Medical History:  Diagnosis Date   Acid reflux    Anxiety    Arthritis  Asthma    Bipolar depression ([facility])    Carpal tunnel syndrome    Depression    Hypothyroid    IBS (irritable bowel syndrome)    Normal pregnancy, first 07/27/2011   Sinusitis    SVD (spontaneous vaginal delivery) 07/28/2011   SVD (spontaneous vaginal delivery) 07/28/2011    PAST SURGICAL HISTORY: Past Surgical History:  Procedure Laterality Date   extraction of wisdom teeth      FAMILY HISTORY: Family History  Problem Relation Age of Onset   Diabetes Mother    Arthritis Mother    Kidney disease Mother    Cancer Mother    Mental illness Mother    Drug abuse Mother    Depression Mother    Asthma Mother    Alcohol abuse Mother    Esophageal cancer Father    Diabetes Father    Arthritis Father    Heart disease Father    Mental illness Father    Hypertension Father    Drug abuse Father    Depression Father    Asthma Father    Alcohol abuse Father    Arthritis Sister    Drug abuse Sister    Depression Sister     Alcohol abuse Sister    Anesthesia problems Neg Hx    Colon cancer Neg Hx    Rectal cancer Neg Hx    Stomach cancer Neg Hx     SOCIAL HISTORY: Social History   Socioeconomic History   Marital status: Married    Spouse name: Not on file   Number of children: 1   Years of education: Not on file   Highest education level: Not on file  Occupational History   Occupation: [employer]  Tobacco Use   Smoking status: Former    Types: Cigarettes    Quit date: 04/19/1996    Years since quitting: 26.0   Smokeless tobacco: Never  Vaping Use   Vaping Use: Never used  Substance and Sexual Activity   Alcohol use: No   Drug use: No   Sexual activity: Yes    Birth control/protection: I.U.D.  Other Topics Concern   Not on file  Social History Narrative   Not on file   Social Determinants of Health   Financial Resource Strain: Not on file  Food Insecurity: Not on file  Transportation Needs: Not on file  Physical Activity: Not on file  Stress: Not on file  Social Connections: Not on file  Intimate Partner Violence: Not on file      Marcial Pacas, M.D. Ph.D.  [facility] [address], [place], [place] [address] Ph: [phone] Fax: [phone]  CC:  Ashok Pall, MD [address] [place],  [place] [address]  Fanny Bien, MD

## 2022-05-03 ENCOUNTER — Ambulatory Visit: Payer: Managed Care, Other (non HMO)

## 2022-05-03 DIAGNOSIS — N3946 Mixed incontinence: Secondary | ICD-10-CM

## 2022-05-03 DIAGNOSIS — M6281 Muscle weakness (generalized): Secondary | ICD-10-CM

## 2022-05-03 DIAGNOSIS — M5459 Other low back pain: Secondary | ICD-10-CM

## 2022-05-03 DIAGNOSIS — R293 Abnormal posture: Secondary | ICD-10-CM

## 2022-05-03 DIAGNOSIS — M62838 Other muscle spasm: Secondary | ICD-10-CM

## 2022-05-03 DIAGNOSIS — R279 Unspecified lack of coordination: Secondary | ICD-10-CM

## 2022-05-03 LAB — ANA W/REFLEX IF POSITIVE: Anti Nuclear Antibody (ANA): NEGATIVE

## 2022-05-03 LAB — C-REACTIVE PROTEIN: CRP: 6 mg/L (ref 0–10)

## 2022-05-03 LAB — SEDIMENTATION RATE: Sed Rate: 10 mm/hr (ref 0–32)

## 2022-05-03 LAB — CK: Total CK: 97 U/L (ref 32–182)
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
## 2022-05-07 ENCOUNTER — Encounter: Payer: Self-pay | Admitting: Family Medicine

## 2022-05-07 ENCOUNTER — Other Ambulatory Visit: Payer: Self-pay | Admitting: Neurology

## 2022-05-07 ENCOUNTER — Ambulatory Visit
Admission: RE | Admit: 2022-05-07 | Discharge: 2022-05-07 | Disposition: A | Discharge status: Home / Self Care | Payer: Managed Care, Other (non HMO) | Source: Ambulatory Visit | Attending: Neurology | Admitting: Neurology

## 2022-05-07 ENCOUNTER — Ambulatory Visit (INDEPENDENT_AMBULATORY_CARE_PROVIDER_SITE_OTHER): Payer: Managed Care, Other (non HMO) | Admitting: Family Medicine

## 2022-05-07 ENCOUNTER — Encounter: Payer: Self-pay | Admitting: Rehabilitative and Restorative Service Providers"

## 2022-05-07 VITALS — Ht 63.0 in | Wt 282.2 lb

## 2022-05-07 DIAGNOSIS — G8929 Other chronic pain: Secondary | ICD-10-CM

## 2022-05-07 DIAGNOSIS — Z713 Dietary counseling and surveillance: Secondary | ICD-10-CM

## 2022-05-07 DIAGNOSIS — R202 Paresthesia of skin: Secondary | ICD-10-CM

## 2022-05-07 DIAGNOSIS — M25551 Pain in right hip: Secondary | ICD-10-CM

## 2022-05-07 NOTE — Patient Instructions (Addendum)
Breathing exercises:  Pay attention to doing these faithfully with focus on good technique.  Learning to breathe well can have a tremendous impact on anxiety.  (See the book Breath by Cain Saupe).  Consider what other activities you can pair breathing exercises with.    1. Get out the food decisions algorithm, and put this where you will see it.  USE IT when you are looking for a snack, especially a sweet snack.    2. In addition to using the  food decisions algorithm, when you are struggling, ask yourself these "decoding" questions:    1. What am I feeling right now?    2. What do I want to feel?    3. What do I truly need right now? Use the Feelings and Needs lists, and WRITE your answers.   You are looking for feelings, not thoughts.  Take a picture of the lists, so you can use it any time.   Bring your responses to your follow-up appt.   3. Food goal: Include vegetables in at least 10 meals per week.       NOTE: More vegetables means more potassium in your diet, which may help to raise low blood  potassium levels.    Follow-up appt on April 1 at 11 AM.

## 2022-05-07 NOTE — Progress Notes (Signed)
Medical Nutrition Therapy PCP Rachell Cipro, MD Therapists [facility] Psych[facility]rist Chucky May, MD Appt start time: 1500 end time: 1600 (1 hour) Primary concerns today: Referred by Rachell Cipro, MD for MNT related to preDM (A1c 5.7 on 09/05/20), obesity, HLD, and eating D/O.  Diagnoses also include bipolar, hypothyroid, and ex-induced asthma.   Relevant history/background: At age 42, in response to a chaotic home and her parents' divorce, Baneen started binge-eating sugar.  By 3rd grade, she started restricting, which worsened at puberty.  At age 69, anorexia was extreme, and she lost 25 lb in a month.  She started medication for bipolar D/O before age 49, and then started binge-eating sugary foods again and gained a lot of weight.  She lost to 150s in early 57s, before getting pregnant with her daughter.  Lost pregnancy weight using [employer] again after the birth of her daughter, and went through a manic phase where weight loss was maintained.  She started seeing [facility] for body dysmorphia in Sept 2021, after which she reintroduced sugar to her diet.  With a family hx of diabetes, she had avoided sugar for the previous 20 years, but last fall's sugar "allowance" led to frequent intake of sugary foods and weight gain.  Anvika wants to be able to maintain moderation with respect to diet, while also reducing her DM risk and stabilizing weight and appetite.  Prior to getting her own therapy re. body image, Tangie's 9-YO daughter started seeing a therapist for concerns about disordered eating, but is doing pretty well right now.  Saraih's mom has her own disordered eating, and poor appetite control.  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  Currently tracking food intake with [employer] app.  She lives with her 9-YO daughter and husband.   Assessment:   Shahadah is walking walking independently now, has made good improvements in swallowing.   She saw  neurologist Margrett Rud, MD on 05/01/22, who did not diagnose any neurological condition.  She has been seeing PT specialist Heather Roberts, who has prescribed exercises to help with back pain , muscle weakness and spasms, and postural dysfunction.  A1c on 05/02/22 was ~6.0, up from previous.   Update on recent health problems: Chronic diarrhea: Resolved; back to alternating with occasional constipation, although not taking constipation med now.   Difficulty swallowing: Pantoprazole has helped. Has also increased ginger intake.   Walking difficulty, mainly R leg: Walking much better.  Started Rinvoq for psoriatic arthritis 05/02/22, which she believes is helping joint pain and skin rashes.   Poor grip strength and hands locking: dropping items slightly less often in past week.   Back pain; Still severe (still going to PT for back pain 1 X wk); doing home ex's only ~1 X wk. Medical evaluations:  Abdominal CT scan: Mild hiatal hernia observed.   MRI of lumbar spine: multiple level lumbar spondylosis, mild/moderate bilateral foraminal stenosis, no evidence of cord compression. Nerve conduction tests: evidence of nerve pathology (?) per patient report. Colonoscopy, 04/30/22:  some diverticuli observed. Upper endoscopy, 04/30/22: Enlarged Schatzki ring (gastroesophageal junction) and small hiatal hernia observed; recommended continue pantoprazole.    Weight: 182.2 lb. (174.6 lb on 04/02/22). (Ht is 63".)  (209 lb in April 2023.)  Usual eating pattern:  Most days getting 3 meals and 2 snacks.  Vegetable intake:  Colonoscopy 1 wk ago, now getting back to 7+ meals/week (usually 2 c salad for dinner).   Usual physical activity:  PT exercises ~2 X wk.  Weekly meal planning: Doing regularly most Saturdays.   Weekday lunch prep:  daily; packing the night before.   24-hr recall:  (Up at 8 AM) B (8 AM)-  1 1/4 c Cheerios, 1 c fat free Lactaid milk, water Snk (11 AM)-  1 [employer] cc cookie (370 kcal), water L  (11:30 PM)-  1 1/2 c [employer] meatballs, noodles, alfredo sauce, water Snk (2 PM)-  3 small cookies, water Snak (4 PM)-  2 slc raisin bread, 2 tsp margarine D (6 PM)-  2 slc "chx apple pie," water Snk ( PM)-  --- Typical day? No.  Cookies and raisin toast were atypical; felt she was eating from anxiety related to making sure she performs well at work.    Nutritional Diagnosis: No improvement on NB-2.1 Physical inactivity as related to energy intake as evidenced by continued limited physical activity d/t disability.     Handouts given during visit include: After-Visit Summary (AVS)  Monitoring/Evaluation:  Dietary intake, exercise, and body weight in 5 week(s).

## 2022-05-10 ENCOUNTER — Ambulatory Visit: Payer: Managed Care, Other (non HMO)

## 2022-05-10 DIAGNOSIS — M6281 Muscle weakness (generalized): Secondary | ICD-10-CM

## 2022-05-10 DIAGNOSIS — R279 Unspecified lack of coordination: Secondary | ICD-10-CM

## 2022-05-10 DIAGNOSIS — M62838 Other muscle spasm: Secondary | ICD-10-CM

## 2022-05-10 DIAGNOSIS — R293 Abnormal posture: Secondary | ICD-10-CM

## 2022-05-10 DIAGNOSIS — M5459 Other low back pain: Secondary | ICD-10-CM | POA: Diagnosis not present

## 2022-05-10 DIAGNOSIS — N3946 Mixed incontinence: Secondary | ICD-10-CM
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
## 2022-05-17 ENCOUNTER — Ambulatory Visit: Payer: Managed Care, Other (non HMO)

## 2022-05-17 DIAGNOSIS — M62838 Other muscle spasm: Secondary | ICD-10-CM

## 2022-05-17 DIAGNOSIS — R279 Unspecified lack of coordination: Secondary | ICD-10-CM

## 2022-05-17 DIAGNOSIS — R293 Abnormal posture: Secondary | ICD-10-CM

## 2022-05-17 DIAGNOSIS — M6281 Muscle weakness (generalized): Secondary | ICD-10-CM

## 2022-05-17 DIAGNOSIS — M5459 Other low back pain: Secondary | ICD-10-CM | POA: Diagnosis not present

## 2022-05-17 DIAGNOSIS — N3946 Mixed incontinence: Secondary | ICD-10-CM
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
## 2022-05-21 ENCOUNTER — Ambulatory Visit: Payer: Managed Care, Other (non HMO)

## 2022-05-31 ENCOUNTER — Ambulatory Visit: Payer: Managed Care, Other (non HMO) | Attending: Family Medicine

## 2022-05-31 DIAGNOSIS — R293 Abnormal posture: Secondary | ICD-10-CM | POA: Insufficient documentation

## 2022-05-31 DIAGNOSIS — R279 Unspecified lack of coordination: Secondary | ICD-10-CM | POA: Insufficient documentation

## 2022-05-31 DIAGNOSIS — M6281 Muscle weakness (generalized): Secondary | ICD-10-CM | POA: Insufficient documentation

## 2022-05-31 DIAGNOSIS — M62838 Other muscle spasm: Secondary | ICD-10-CM | POA: Insufficient documentation

## 2022-05-31 DIAGNOSIS — N3946 Mixed incontinence: Secondary | ICD-10-CM | POA: Insufficient documentation
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
## 2022-06-04 ENCOUNTER — Ambulatory Visit: Payer: Managed Care, Other (non HMO)

## 2022-06-14 NOTE — Progress Notes (Signed)
06/14/2022 SHANETTE CZARNIAK [id number] 1980-09-01  Referring provider: Fanny Bien, MD Primary GI doctor: Dr. Loletha Carrow  ASSESSMENT AND PLAN:   Gastroesophageal reflux disease with Hiatal hernia EGD 04/2022 widely patent Schatzki ring, small hiatal hernia otherwise unremarkable. Patient tolerating pantoprazole Continue pantoprazole daily, can try every other day or switching out with Pepcid or famotidine. Diet discussed, avoid NSAIDs.  Irritable bowel syndrome with both constipation and diarrhea 04/2022 colonoscopy diverticulosis otherwise completely unremarkable, normal biopsies. Not tolerant of Linzess or Trulance. Has had some improvement off of Humira, question medication effect. Following up with nutritionist, went over [place]. If patient has worsening gas or further symptoms, has SIBO testing kit at home to perform but otherwise patient states her symptoms have improved significantly to does not feel the need to complete at this time.  Diverticulosis of colon without hemorrhage Will call if any symptoms. Add on fiber supplement, avoid NSAIDS, information given  6 month follow up per patient request, will call sooner if any issues.   Patient Care Team: Fanny Bien, MD as PCP - General (Family Medicine) Kennith Center, RD as Dietitian (Family Medicine)  HISTORY OF PRESENT ILLNESS: 42 y.o. female with a past medical history of GERD, psoriatic arthritis follows with Dr. Trudie Reed at [facility] rheumatology, switching from humira to rinvoq, BMI 32, cough, neuropathy and others listed below presents for evaluation of GERD and AB pain.   Labs reviewed from 04/03/22 show no anemia Hgb 13, platelets 333, WBC 11.8.  K3.4, BUN 11, creatinine 0.63, normal liver function CT abdomen pelvis with contrast for urinary incontinence right abdominal pain shows no acute abnormality abdomen or pelvis.  Small hiatal hernia with mild symmetric distal esophageal wall thickening reflux  esophagitis. 04/30/2022 EGD and colonoscopy for multitude of symptoms including GERD, dysphagia, abdominal pain, nocturnal symptoms EGD normal mucosa esophagus, widely patent Schatzki ring, small hiatal hernia, normal mucosa stomach and duodenum Colonoscopy showed excellent prep normal TI, normal mucosa entire colon, diverticulosis in left side All biopsies were unremarkable. Patient presents for follow-up  She states the protonix has been very helpful.  She had diarrhea with trulance so stopped it. In the past intolerant to linzess.   States her Bm's have been 2-3 daily, more formed, no AB pain, bloating.  Some increasing AB bloating.  States she is not having any swallowing issues and her GERD is better controlled.  She states she has stopped the humira and started rinvoq Feb 14th, states a lot of her symptoms are doing better including her neuropathy.  No melena.  No NSAIDS, no ETOH, no smoking, no drug use.   Wt Readings from Last 10 Encounters:  05/07/22 282 lb 3.2 oz (128 kg)  05/01/22 182 lb (82.6 kg)  04/30/22 173 lb (78.5 kg)  04/05/22 173 lb (78.5 kg)  04/03/22 182 lb (82.6 kg)  02/13/22 182 lb 3.2 oz (82.6 kg)  09/27/21 201 lb 9.6 oz (91.4 kg)  07/06/21 209 lb (94.8 kg)  04/17/21 207 lb 3.2 oz (94 kg)  11/17/20 200 lb 6.4 oz (90.9 kg)      She  reports that she quit smoking about 26 years ago. Her smoking use included cigarettes. She has never used smokeless tobacco. She reports that she does not drink alcohol and does not use drugs.  Current Medications:   Current Outpatient Medications (Endocrine & Metabolic):    levonorgestrel (MIRENA, 52 MG,) 20 MCG/DAY IUD, Take 1 device by intrauterine route.   levothyroxine (SYNTHROID) 100  MCG tablet, Take 100 mcg by mouth once a week.   levothyroxine (SYNTHROID) 112 MCG tablet, Take 112 mcg by mouth daily before breakfast.   Current Outpatient Medications (Respiratory):    fexofenadine (ALLEGRA ALLERGY) 180 MG tablet, Take  180 mg by mouth daily.  Current Outpatient Medications (Analgesics):    RINVOQ 15 MG TB24, Take 1 tablet by mouth daily. (Patient not taking: Reported on 04/30/2022)   Current Outpatient Medications (Other):    Aflibercept (EYLEA) 2 MG/0.05ML SOSY, by Intravitreal route.   AMBULATORY NON FORMULARY MEDICATION, Medication Name: gabapentin 6%/Lido5%/Dicl3% Sig: apply to the affected area topically three times a day as needed for pain   cholecalciferol (VITAMIN D3) 25 MCG (1000 UNIT) tablet, Take 5 Units by mouth daily.   FLUoxetine (PROZAC) 20 MG capsule, Take 20 mg by mouth daily.   ofloxacin (OCUFLOX) 0.3 % ophthalmic solution, Apply to eye.   OLANZapine (ZYPREXA) 10 MG tablet, Take 10 mg by mouth at bedtime.   OLANZapine-Samidorphan (LYBALVI) 5-10 MG TABS, Take 1 tablet by mouth daily. (Patient not taking: Reported on 04/30/2022)   pantoprazole (PROTONIX) 20 MG tablet, Take 1 tablet (20 mg total) by mouth daily before breakfast.   Plecanatide (TRULANCE) 3 MG TABS, Take 3 mg by mouth daily. (Patient not taking: Reported on 04/05/2022)  Medical History:  Past Medical History:  Diagnosis Date   Acid reflux    Anxiety    Arthritis    Asthma    Bipolar depression ([place])    Carpal tunnel syndrome    Depression    Hypothyroid    IBS (irritable bowel syndrome)    Normal pregnancy, first 07/27/2011   Sinusitis    SVD (spontaneous vaginal delivery) 07/28/2011   SVD (spontaneous vaginal delivery) 07/28/2011   Allergies:  Allergies  Allergen Reactions   Prednisone Shortness Of Breath, Rash, Other (See Comments) and Cough    Any steroids   Lactose Intolerance (Gi) Diarrhea and Nausea And Vomiting   Morphine And Related Other (See Comments)    Reaction unknown  Bipolar reaction   Amoxicillin Diarrhea and Other (See Comments)   Codeine Other (See Comments)    Reaction: causes bipolar   Dust Mite Extract    Methotrexate Nausea Only   Milk ([place])    Naproxen Diarrhea   Pollen Extract     Lamotrigine Hives, Other (See Comments) and Rash     Surgical History:  She  has a past surgical history that includes extraction of wisdom teeth. Family History:  Her family history includes Alcohol abuse in her father, mother, and sister; Arthritis in her father, mother, and sister; Asthma in her father and mother; Cancer in her mother; Depression in her father, mother, and sister; Diabetes in her father and mother; Drug abuse in her father, mother, and sister; Esophageal cancer in her father; Heart disease in her father; Hypertension in her father; Kidney disease in her mother; Mental illness in her father and mother.  REVIEW OF SYSTEMS  : All other systems reviewed and negative except where noted in the History of Present Illness.  PHYSICAL EXAM: There were no vitals taken for this visit. General:   Pleasant, well developed female in no acute distress Head:   Normocephalic and atraumatic. Eyes:  sclerae anicteric,conjunctive pink  Heart:   regular rate and rhythm Pulm:  Clear anteriorly; no wheezing Abdomen:   Soft, Obese AB, Sluggish bowel sounds. moderate tenderness in the epigastrium and in the RLQ. Without guarding and Without rebound, No organomegaly appreciated.  Rectal: Not evaluated Extremities:  Without edema. Msk: Symmetrical without gross deformities. Peripheral pulses intact.  Neurologic:  Alert and  oriented x4;  No focal deficits.  Skin:   Dry and intact without significant lesions or rashes. Psychiatric:  Cooperative.anxious mood and affect.  RELEVANT LABS AND IMAGING: CBC    Component Value Date/Time   WBC 10.0 04/05/2022 1456   RBC 4.77 04/05/2022 1456   HGB 13.5 04/05/2022 1456   HCT 40.7 04/05/2022 1456   PLT 335.0 04/05/2022 1456   MCV 85.4 04/05/2022 1456   MCH 28.1 04/03/2022 2056   MCHC 33.0 04/05/2022 1456   RDW 14.3 04/05/2022 1456   LYMPHSABS 3.6 04/05/2022 1456   MONOABS 0.8 04/05/2022 1456   EOSABS 0.1 04/05/2022 1456   BASOSABS 0.1 04/05/2022  1456    CMP     Component Value Date/Time   NA 140 04/05/2022 1456   K 3.4 (L) 04/05/2022 1456   CL 103 04/05/2022 1456   CO2 29 04/05/2022 1456   GLUCOSE 103 (H) 04/05/2022 1456   BUN 11 04/05/2022 1456   CREATININE 0.68 04/05/2022 1456   CALCIUM 9.3 04/05/2022 1456   PROT 7.6 04/05/2022 1456   ALBUMIN 4.6 04/05/2022 1456   AST 16 04/05/2022 1456   ALT 14 04/05/2022 1456   ALKPHOS 57 04/05/2022 1456   BILITOT 0.3 04/05/2022 1456   GFRNONAA >60 04/03/2022 2056     Vladimir Crofts, PA-C 8:13 AM

## 2022-06-18 ENCOUNTER — Encounter: Payer: Self-pay | Admitting: Physician Assistant

## 2022-06-18 ENCOUNTER — Ambulatory Visit (INDEPENDENT_AMBULATORY_CARE_PROVIDER_SITE_OTHER): Payer: Managed Care, Other (non HMO) | Admitting: Family Medicine

## 2022-06-18 ENCOUNTER — Ambulatory Visit: Payer: Managed Care, Other (non HMO) | Admitting: Physician Assistant

## 2022-06-18 VITALS — BP 110/60 | HR 76 | Ht 63.0 in | Wt 190.4 lb

## 2022-06-18 DIAGNOSIS — K5730 Diverticulosis of large intestine without perforation or abscess without bleeding: Secondary | ICD-10-CM

## 2022-06-18 DIAGNOSIS — K582 Mixed irritable bowel syndrome: Secondary | ICD-10-CM | POA: Diagnosis not present

## 2022-06-18 DIAGNOSIS — K2100 Gastro-esophageal reflux disease with esophagitis, without bleeding: Secondary | ICD-10-CM | POA: Diagnosis not present

## 2022-06-18 DIAGNOSIS — Z713 Dietary counseling and surveillance: Secondary | ICD-10-CM

## 2022-06-18 NOTE — Patient Instructions (Signed)
Recommended book on FODMAP: The Complete Low-FODMAP Diet (A Revolutionary Plan for Managing IBS and Other Digestive Disorders) Paperback by  Odis Hollingshead & Turner Daniels.    Breathing exercises:  Focus on good technique.  (See the book Breath by Cain Saupe).  Consider what other activities you can pair breathing exercises with.     1. When you are struggling, ask yourself these "decoding" questions:     - What am I feeling right now?     - What do I want to feel?     - What do I truly need right now? Use the Feelings and Needs lists, and WRITE your answers.   You are looking for feelings, not thoughts.  Use the picture of the lists on your phone.   Bring your responses to your follow-up appt.  NOTE: If you get to the point of that downward spiral, you are much less likely to be able to accurately analyze what led to the choices you've made or the thoughts you have.   2. Food goal: Include vegetables in at least 10 meals per week.   3. Physical activity: Get at least 60 minutes per week of any type of physical activity (spread over at least 2 X wk).    Review your [employer] app, and document # of meals that included veg's during the week.  Document physical activity in your [facility] app and in schedule book.    Next family vacation:  Make your preferences (and needs) known in advance to family members.  It is important to recognize that our habits contribute to our self-image and the image we present to the world.  Once you establish healthy behaviors as HABITUAL, people start to see you in that way.  This translates into an awareness of your preferences and needs when you are together.    Follow-up appt on Monday, May 13 at 4 PM.

## 2022-06-18 NOTE — Progress Notes (Signed)
Medical Nutrition Therapy PCP Stephanie Cipro, MD Therapists Stephanie [name] Psychiatrist Stephanie May, MD Appt start time: 1500 end time: 1600 (1 hour) Primary concerns today: Referred by Stephanie Cipro, MD for MNT related to preDM (A1c 5.7 on 09/05/20), obesity, HLD, and eating D/O.  Diagnoses also include bipolar, hypothyroid, and ex-induced asthma.   Relevant history/background: At age 42, in response to a chaotic home and her parents' divorce, Stephanie [name] started binge-eating sugar.  By 3rd grade, she started restricting, which worsened at puberty.  At age 13, anorexia was extreme, and she lost 25 lb in a month.  She started medication for bipolar D/O before age 59, and then started binge-eating sugary foods again and gained a lot of weight.  She lost to 150s in early 65s, before getting pregnant with her daughter.  Lost pregnancy weight using [employer] again after the birth of her daughter, and went through a manic phase where weight loss was maintained.  She started seeing [facility] ([facility]) for body dysmorphia in Sept 2021, after which she reintroduced sugar to her diet.  With a family hx of diabetes, she had avoided sugar for the previous 20 years, but last fall's sugar "allowance" led to frequent intake of sugary foods and weight gain.  Stephanie [name] wants to be able to maintain moderation with respect to diet, while also reducing her DM risk and stabilizing weight and appetite.  Prior to getting her own therapy re. body image, Stephanie [name]'s 9-YO daughter started seeing a therapist for concerns about disordered eating, but is doing pretty well right now.  Stephanie [name]'s mom has her own disordered eating, and poor appetite control.  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  Currently tracking food intake with [employer] app.  She lives with her 9-YO daughter and husband.   Assessment:   Stephanie [name] has just returned from a family trip to [place], [place], where her extended family provided many  foods she would not normally eat.  She sent the leftover foods to work with her husband today.  In an appt this AM, her gastroenterologist gave her a handout about a FODMAP diet, although the implication was that it is a therapeutic diet.  FODMAP is not a therapeutic diet, but is merely diagnostic, and should be given with comprehensive guidance.  (I provided additional patient ed materials and book recommendation.)  She has experimented with incorporating vegetables to her lunches.  On vacation, Stephanie [name] started having several snacks between breakfast and lunch, which she has tried to reign in since getting home 3 days ago.   A1c on 05/02/22 was ~6.0, up from previous.   Update on recent health problems:  Chronic diarrhea: Resolved. Difficulty swallowing: Resolved with Pantoprazole.    Walking difficulty: Dramatically improved; assumed to be related to [place], which she discontinued in December.  Rinvoq for psoriatic arthritis started 05/02/22 is helping joint pain and skin rashes.   Poor grip strength and hands locking: Improved, but still occasionally dropping items.   Back pain: Much improved (plans one more PT session for back pain); doing home ex's only sporadically.  Weight: 190.4 lb. (182.2 lb on 05/07/22). (Ht is 63".)  (209 lb in April 2023.)  Usual eating pattern:  Most days getting 3 meals and 2 snacks.  Usual physical activity:  None currently.  Wants to start some consistent walking and tai chi.  Still has [employer].  Weekly meal planning: Doing regularly most Saturdays.   Weekday lunch prep:  daily; packing the  night before.   Progress on Goals:   - Use of Fd Dec's Algorithm: Has used sometimes to good effect.  Has also bought healthy options for snacks.    - Use of Decoding Qs: Has used several times, and has found this helpful, especially in managing anxiety.    - Vegetable intake: Estimates she's is getting veg's in ~10 meals.      Nutritional Diagnosis: No progress on  NB-2.1 Physical inactivity as related to energy intake as evidenced by no consistent physical activity (although goal set today).     Handouts given during visit include: After-Visit Summary (AVS)  Monitoring/Evaluation:  Dietary intake, exercise, and body weight in 6 week(s).

## 2022-06-18 NOTE — Patient Instructions (Addendum)
Please call to make a follow up in 6 months with Stephanie [name]   We may want to evaluate you for small intestinal bacterial overgrowth, this can cause increase gas, bloating, loose stools or constipation.  There is a test for this we can do or sometimes we will treat a patient with an antibiotic to see if it helps.     FODMAP stands for fermentable oligo-, di-, mono-saccharides and polyols (1). These are the scientific terms used to classify groups of carbs that are notorious for triggering digestive symptoms like bloating, gas and stomach pain.        _______________________________________________________  If your blood pressure at your visit was 140/90 or greater, please contact your primary care physician to follow up on this.  _______________________________________________________  If you are age 80 or older, your body mass index should be between 23-30. Your Body mass index is 33.73 kg/m. If this is out of the aforementioned range listed, please consider follow up with your Primary Care Provider.  If you are age 69 or younger, your body mass index should be between 19-25. Your Body mass index is 33.73 kg/m. If this is out of the aformentioned range listed, please consider follow up with your Primary Care Provider.   ________________________________________________________  The [facility] providers would like to encourage you to use [facility] to communicate with providers for non-urgent requests or questions.  Due to long hold times on the telephone, sending your provider a message by [facility] may be a faster and more efficient way to get a response.  Please allow 48 business hours for a response.  Please remember that this is for non-urgent requests.  _______________________________________________________ It was a pleasure to see you today!  Thank you for trusting me with your gastrointestinal care!

## 2022-06-19 NOTE — Progress Notes (Signed)
____________________________________________________________  Attending physician addendum:  Thank you for sending this case to me. I have reviewed the entire note and agree with the plan.  Glad to hear she is doing better since the recent procedures.  Wilfrid Lund, MD  ____________________________________________________________

## 2022-06-21 ENCOUNTER — Ambulatory Visit: Payer: Managed Care, Other (non HMO) | Attending: Physical Medicine and Rehabilitation

## 2022-06-21 DIAGNOSIS — M6281 Muscle weakness (generalized): Secondary | ICD-10-CM | POA: Insufficient documentation

## 2022-06-21 DIAGNOSIS — M5459 Other low back pain: Secondary | ICD-10-CM | POA: Insufficient documentation

## 2022-06-21 DIAGNOSIS — M62838 Other muscle spasm: Secondary | ICD-10-CM | POA: Diagnosis present

## 2022-06-21 DIAGNOSIS — N3946 Mixed incontinence: Secondary | ICD-10-CM | POA: Insufficient documentation

## 2022-06-21 DIAGNOSIS — R293 Abnormal posture: Secondary | ICD-10-CM | POA: Diagnosis present

## 2022-06-21 DIAGNOSIS — R279 Unspecified lack of coordination: Secondary | ICD-10-CM | POA: Diagnosis present
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
## 2022-06-26 ENCOUNTER — Ambulatory Visit: Payer: Managed Care, Other (non HMO)

## 2022-06-28 ENCOUNTER — Other Ambulatory Visit: Payer: Self-pay | Admitting: Family Medicine

## 2022-06-28 DIAGNOSIS — Z1231 Encounter for screening mammogram for malignant neoplasm of breast: Secondary | ICD-10-CM

## 2022-07-27 ENCOUNTER — Other Ambulatory Visit: Payer: Self-pay | Admitting: Physician Assistant

## 2022-07-30 ENCOUNTER — Ambulatory Visit (INDEPENDENT_AMBULATORY_CARE_PROVIDER_SITE_OTHER): Payer: Managed Care, Other (non HMO) | Admitting: Family Medicine

## 2022-07-30 DIAGNOSIS — Z713 Dietary counseling and surveillance: Secondary | ICD-10-CM | POA: Diagnosis not present

## 2022-07-30 NOTE — Progress Notes (Signed)
Medical Nutrition Therapy PCP [name], MD Therapists Abel Presto Psychiatrist Milagros Evener, MD Appt start time: 1500 end time: 1600 (1 hour) Primary concerns today: Referred by [name], MD for MNT related to preDM (A1c 5.7 on 09/05/20), obesity, HLD, and eating D/O.  Diagnoses also include bipolar, hypothyroid, and ex-induced asthma.   Relevant history/background: At age 42, in response to a chaotic home and her parents' divorce, Caia started binge-eating sugar.  By 3rd grade, she started restricting, which worsened at puberty.  At age 83, anorexia was extreme, and she lost 25 lb in a month.  She started medication for bipolar D/O before age 21, and then started binge-eating sugary foods again and gained a lot of weight.  She lost to 150s in early 30s, before getting pregnant with her daughter.  Lost pregnancy weight using [employer] again after the birth of her daughter, and went through a manic phase where weight loss was maintained.  She started seeing [facility] ([facility]) for body dysmorphia in Sept 2021, after which she reintroduced sugar to her diet.  With a family hx of diabetes, she had avoided sugar for the previous 20 years, but last fall's sugar "allowance" led to frequent intake of sugary foods and weight gain.  Nicolet wants to be able to maintain moderation with respect to diet, while also reducing her DM risk and stabilizing weight and appetite.  Prior to getting her own therapy re. body image, Yalena's 9-YO daughter started seeing a therapist for concerns about disordered eating, but is doing pretty well right now.  Evella's mom has her own disordered eating, and poor appetite control.  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  Currently tracking food intake with [employer] app.  She lives with her 9-YO daughter and husband.   Assessment:   Hamna continues to make progress on the medical problems of the past several months.  One of the  biggest challenges currently is the fatigue related to Zyprexa she is taking.  She and Dr. Evelene Croon are reluctant to reduce dosage b/c she is otherwise doing well on this medication, but it does make exercise much more difficult, as she is sleeping ~10 hrs per night, plus a 2-hr nap on Sundays, and still feels extremely tired each day.  Encouraged use of Decoding Qs, as Shaleen has been eating more sweets recently, although has also had some experiences of making moderate choices, such as at Mother's Day brunch yesterday.    Update on recent health problems:     Poor grip strength and hands locking: Still improving.   Back pain: Walking well. much improved  doing home ex's only ~4 X wk; bending still painful.  Weight: No wt check per patient preference. (190.4 lb on 06/18/22). (Ht is 63".)  (209 lb in April 2023.)  Usual eating pattern:  Most days getting 3 meals and 2 snacks.   Sleep: Getting ~10 hrs/night.  Usually naps 2 hrs (1-3 PM) on Sundays.   Weekly meal planning: Doing regularly most Saturdays.   Weekday lunch prep:  daily; packing the night before.   Progress on Goals:   - Use of Decoding Qs: Has not used recently.    - Vegetable intake: Estimates she's is getting veg's in ~10 meals, including more often at lunch.    - Physical activity:  None consistently. Feels fatigued most days.  A few home PT exercises ~4 X wk.    Nutritional Diagnosis: No progress on NB-2.1 Physical  inactivity as related to energy intake as evidenced by no consistent physical activity.     Handouts given during visit include: After-Visit Summary (AVS)  Monitoring/Evaluation:  Dietary intake, exercise, and body weight in 4 week(s).

## 2022-07-30 NOTE — Patient Instructions (Addendum)
Napping:  Make a strong effort to complete naps by 3 PM.     Reiterated from last appt: Breathing exercises:  Focus on good technique.  (See the book Breath by Sharren Bridge).  Consider what other activities you can pair breathing exercises with.     Behavioral Goals:  1. When you are struggling, ask yourself these "decoding" questions:     - What am I feeling right now?     - What do I want to feel?     - What do I truly need right now? Use the Feelings and Needs lists, and WRITE your answers.   You are looking for feelings, not thoughts.  Use the picture of the lists on your phone.   Bring your responses to your follow-up appt.  NOTE: If you get to the point of that downward spiral, you are much less likely to be able to accurately analyze what led to the choices you've made or the thoughts you have.   2. Food goal: Include vegetables in at least 10 meals per week.   3. Physical activity: Get at least 5 minutes 4 days per week of any type of physical activity.    Document # of meals that include veg's during the week.  Document physical activity in schedule book.    Follow-up appt on Thursday, June 20 at 4 PM.

## 2022-08-08 ENCOUNTER — Encounter: Payer: Managed Care, Other (non HMO) | Admitting: Neurology

## 2022-09-06 ENCOUNTER — Ambulatory Visit: Payer: Managed Care, Other (non HMO) | Admitting: Family Medicine

## 2022-09-17 ENCOUNTER — Ambulatory Visit
Admission: RE | Admit: 2022-09-17 | Discharge: 2022-09-17 | Disposition: A | Discharge status: Home / Self Care | Payer: Managed Care, Other (non HMO) | Source: Ambulatory Visit | Attending: Family Medicine | Admitting: Family Medicine

## 2022-09-17 DIAGNOSIS — Z1231 Encounter for screening mammogram for malignant neoplasm of breast: Secondary | ICD-10-CM

## 2022-09-27 ENCOUNTER — Ambulatory Visit (INDEPENDENT_AMBULATORY_CARE_PROVIDER_SITE_OTHER): Payer: Managed Care, Other (non HMO) | Admitting: Family Medicine

## 2022-09-27 VITALS — Ht 63.0 in | Wt 200.6 lb

## 2022-09-27 DIAGNOSIS — Z713 Dietary counseling and surveillance: Secondary | ICD-10-CM

## 2022-09-27 NOTE — Progress Notes (Signed)
Medical Nutrition Therapy PCP [name], MD Therapists [facility] Psychiatrist Milagros Evener, MD Appt start time: 1500 end time: 1600 (1 hour) Primary concerns today: Referred by [name], MD for MNT related to preDM, obesity, HLD, and eating D/O.  Diagnoses also include bipolar, hypothyroid, and ex-induced asthma.   Relevant history/background: At age 42, in response to a chaotic home and her parents' divorce, Stephanie [name] started binge-eating sugar.  By 3rd grade, she started restricting, which worsened at puberty.  At age 20, anorexia was extreme, and she lost 25 lb in a month.  She started medication for bipolar D/O before age 31, and then started binge-eating sugary foods again and gained a lot of weight.  She lost to 150s in early 30s, before getting pregnant with her daughter.  Lost pregnancy weight using [employer] again after the birth of her daughter, and went through a manic phase where weight loss was maintained.  She started seeing [facility] ([facility]) for body dysmorphia in Sept 2021, after which she reintroduced sugar to her diet.  With a family hx of diabetes, she had avoided sugar for the previous 20 years, but last fall's sugar "allowance" led to frequent intake of sugary foods and weight gain.  Stephanie [name] wants to be able to maintain moderation with respect to diet, while also reducing her DM risk and stabilizing weight and appetite.  Prior to getting her own therapy re. body image, Stephanie [name]'s 9-YO daughter started seeing a therapist for concerns about disordered eating, but is doing pretty well right now.  Stephanie [name]'s mom has her own disordered eating, and poor appetite control.  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  Currently tracking food intake with [employer] app.  She lives with her 9-YO daughter and husband.   Assessment:   This fall, Stephanie [name] will be working the after-school program 2 or 3 PM to 6 PM Monday through Friday, so is hoping to do a lot  of pre-prep meals on weekends (although also hopes to find a job with more hours).  Aug 21 is the first day of school.  She has gotten back to recording [employer] points the past 2 weeks, which has helped her with more balanced food choices.  Her [employer] app suggests she get 25 or 26 points, and she has averaged ~36 the past 2 weeks.  Today we talked about how she can get more satiety from incorporating more veg's into meals, which will be especially helpful as she struggles with appetite management while on Zyprexa.    Weight: 200.6 lb; (190.4 lb on 06/18/22; ht is 63".)  (209 lb in April 2023.)  Usual eating pattern:  Most days getting 3 meals and 2 snacks.   Sleep: Getting 10 hrs/night.  Naps 1 hr 1-2 X wk.  (Still taking Zyprexa 10 mg, which makes her very tired as well as over-hungry.) Weekly meal planning: Planning dinners (and lunches from dinner leftovers) weekly.   Weekday lunch prep:  Doing regularly.   Progress on Goals:   - Use of Decoding Qs: Has not used (but has made an effort to meet some needs by reaching out to friends recently).      - Vegetable intake: Estimates she's is getting veg's in ~10 meals (although often a small portion).    - Physical activity:  She has joined her daughter in Just Dance ~2 X wk the last 2 weeks.  Also walked and swam during the week at the beach.  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  Still doing a few home PT exercises 3-4 X wk.    24-hr recall:  (Up at 6:30 AM) B (7 AM)-  1 1/2 c Cheerios, 1 c lactose-free fat-free milk, water Snk (9:30)-  5 oz Grk cherry yogurt, 1 c grapes, water L (12 PM)-  2 small flour tortillas, 1/4 oz cheese, 3-4 oz shrimp, romaine, 5 mini cookies (140 kcal), water Snk (4 PM)-  1 salted caramel cookie ([employer]), water D (6 PM)-  1 c chx pesto linguini, 2 c raw spinach, 3-4 croutons, 2 tbsp drsng, 1 c premium ice cream, water Snk ( PM)-  2/3 c premium ice cream Typical  day? Yes.     Nutritional Diagnosis: Slight progress on NB-2.1 Physical inactivity as related to energy intake as evidenced by getting ~30 min 2 X wk the past two weeks.     Handouts given during visit include: After-Visit Summary (AVS)  Monitoring/Evaluation:  Dietary intake, exercise, and body weight in 4 week(s).

## 2022-09-27 NOTE — Patient Instructions (Signed)
[name]: Adriana Simas them as you would apples for sauce, and try it with cinnamon and very little sugar.    Reminder: Breathing exercises.  Focus on good technique.  (See the book Breath by Sharren Bridge).  Consider what other activities you can pair breathing exercises with.    1. Use the Food Decisions Algorithm when you are feeling stressed and leaning toward food.  This is also a good time to ask yourself those decoding Qs:  "Decoding" questions:     - What am I feeling right now?     - What do I want to feel?     - What do I truly need right now? Use the picture of the Feelings and Needs lists on your phone.   NOTE: If you get to the point of that downward spiral, you are much less likely to be able to accurately analyze what led to the choices you've made or the thoughts you have.   NOTE: When you first start using these questions, it's likely to be retrospectively rather than in the moment.  For example, last night: You were feeling: Anxiety, anger, frustration, hurt, ostracized, different, sad,.  You wanted to feel:  Relaxed, happy, peaceful, sleepy, loved, accepted, understood.  What you needed:  A hug, an "escape" activity to distract, to stop self-judging, hot bath, a call to a friend.   2. Food goal: Include vegetables (at least 2 cups per meal) in at least 10 meals per week.       - Make a list of veg recipes you'd like to try.  Use this list as a basis for shopping.       - Keep in the house fruits you especially for snacks.    3. Physical activity: Get at least 15 minutes 3 days per week of any type of physical activity.     Document physical activity and veg's in your schedule book.     Follow-up appt on Tuesday, Aug 6 at 4 PM.

## 2022-10-22 NOTE — Progress Notes (Signed)
Medical Nutrition Therapy PCP [name], MD Therapists [facility] Psych[facility]rist Milagros Evener, MD Appt start time: 1600 end time: 1700 (1 hour) Primary concerns today: Referred by [name], MD for MNT related to preDM, obesity, HLD, and eating D/O.  Diagnoses also include bipolar, hypothyroid, and ex-induced asthma.   Relevant history/background: At age 42, in response to a chaotic home and her parents' divorce, Stephanie [name] started binge-eating sugar.  By 3rd grade, she started restricting, which worsened at puberty.  At age 33, anorexia was extreme, and she lost 25 lb in a month.  She started medication for bipolar D/O before age 15, and then started binge-eating sugary foods again and gained a lot of weight.  She lost to 150s in early 30s, before getting pregnant with her daughter.  Lost pregnancy weight using [employer] again after the birth of her daughter, and went through a manic phase where weight loss was maintained.  She started seeing [facility] ([facility]) for body dysmorphia in Sept 2021, after which she reintroduced sugar to her diet.  With a family hx of diabetes, she had avoided sugar for the previous 20 years, but last fall's sugar "allowance" led to frequent intake of sugary foods and weight gain.  Stephanie [name] wants to be able to maintain moderation with respect to diet, while also reducing her DM risk and stabilizing weight and appetite.  Prior to getting her own therapy re. body image, Stephanie [name]'s 9-YO daughter started seeing a therapist for concerns about disordered eating, but is doing pretty well right now.  Stephanie [name]'s mom has her own disordered eating, and poor appetite control.  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  Currently tracking food intake with [employer] app.  She lives with her 9-YO daughter and husband.   Assessment:   Stephanie [name] saw Dr. Duanne Guess yesterday: TC was 245; HDL was 80; LDL was 151, and TG was 84.  They will re-check in October, but this  is what Stephanie [name] wanted to focus most on in today's appt.    Weight: 202.2 lb; (200.6 lb on 09/27/22; ht is 63".)  (209 lb in April 2023.)  Usual eating pattern:  Most days getting 3 meals and 2 snacks.   Sleep: Estimates 10 hrs/night, and still napping an hour 1-2 X wk.  (Will meet with Dr. Evelene Croon next week, and plans to talk re. Zyprexa dose, which makes her very tired as well as over-hungry.) Weekly meal planning: Planning dinners (and lunches from dinner leftovers) weekly.   Weekday lunch prep:  Doing regularly.   Progress on Goals:   - Use of Decoding Qs: Used a couple of times; prompted her to connect with some friends.   - Vegetable intake:  Estimates she's is getting veg's in ~10 meals (usually ~2 c raw veg portions).    - Physical activity:   Trying to be more active at work, occasionally doing Just Dance with her daughter.  Still doing a few home PT exercises ~3 X wk.    24-hr recall:  (Up at 6:30 AM) B (7 AM)-  1 c Cheerios, 1 c lactaid milk, water Snk (10 AM)-  1 banana, water L (12:15 PM)-  1 [place] sandw, light mayo, 1 single-serve BoomChickaPop popcorn Snk (3:30)-  1/2 c B&J's ice cream D (5:30 PM)-  1 c mac&chs, salmon, olive oil, water Snk ( PM)-  --- Typical day? Yes.     Nutritional Diagnosis: No progress on NB-2.1 Physical inactivity as related to energy  intake as evidenced by little or no exercise, and no planned  exercise routine.     Handouts given during visit include: After-Visit Summary (AVS)  Monitoring/Evaluation:  Dietary intake, exercise, and body weight in 4 week(s).

## 2022-10-23 ENCOUNTER — Ambulatory Visit (INDEPENDENT_AMBULATORY_CARE_PROVIDER_SITE_OTHER): Payer: Managed Care, Other (non HMO) | Admitting: Family Medicine

## 2022-10-23 VITALS — Ht 63.0 in | Wt 202.2 lb

## 2022-10-23 DIAGNOSIS — Z713 Dietary counseling and surveillance: Secondary | ICD-10-CM | POA: Diagnosis not present

## 2022-10-23 NOTE — Patient Instructions (Signed)
To help lower LDL levels, you will want to limit the saturated fat in your diet.  Sources include mostly animal fats (butter, cheese, meat, processed meats, fried foods, baked goods and dessert foods).   - It will also be good to make sure you are getting plenty of soluble fiber, which is found in fruit, veg's, as well as beans and some grains/seeds like psyllium, flax seed meal, chia, or oat bran.  Where you'll find a lot of fiber in particular is in seeds of fruits and veg's.   - A greater proportion of your dietary fat coming from polyunsaturated fat will also lower LDL and all fractions of cholesterol.    Polyunsaturated fat: Most nut and seed oils (corn, sunflower, sesame, walnuts, soy) as well fish oil.   Monunsaturated fat tends to lower LDL without lowering HDL.  Sources include XV olive oil, avocado (and there is some MUFA also in many nut/seed oils such as peanut and macadamia).   NOTE: A healthy way to prepare veg's is to stir-fry in olive oil.  You can minimize the amt of oil you use with a non-stick pan, and by adding a small amount of water at the end of cooking.    Operationalizing these recommendations would include more meals with beans as a protein source, an abundance of veg's and fruits, especially those in which you are also eating the seeds.    BEANS:  To minimize digestive problems:  - Drain canned beans well to lessen the fiber and resistant starch.   - Get moderate portions and be deliberate in how often you have beans.  You  may find that your tolerance if you can progress portion size and/or frequency slowly over time.      [name]: Adriana Simas them as you would apples for sauce, and try it with cinnamon and very little sugar.     Reminder: Breathing exercises.  Focus on good technique.  (See the book Breath by Sharren Bridge).  Consider what other activities you can pair breathing exercises with.     1. Use the Food Decisions Algorithm when you are feeling stressed and leaning  toward food.  This is also a good time to ask yourself those decoding Qs:  "Decoding" questions:     - What am I feeling right now?     - What do I want to feel?     - What do I truly need right now? Use the picture of the Feelings and Needs lists on your phone.   NOTE: If you get to the point of that downward spiral, you are much less likely to be able to accurately analyze what led to the choices you've made or the thoughts you have.   NOTE: When you first start using these questions, it's likely to be retrospectively rather than in the moment.  For example, last night: You were feeling: Anxiety, anger, frustration, hurt, ostracized, different, sad,.  You wanted to feel:  Relaxed, happy, peaceful, sleepy, loved, accepted, understood.  What you needed:  A hug, an "escape" activity to distract, to stop self-judging, hot bath, a call to a friend.  2. Food goal: Include vegetables (at least 2 cups per meal) in at least 10 meals per week.       - Make a list of veg recipes you'd like to try.  Use this list as a basis for shopping.       - Keep in the house fruits you especially for snacks.  3. Physical activity: Get at least 15 minutes 3 days per week of any type of physical activity.     Document physical activity and veg's in your schedule book.    Follow-up appt on Tuesday, Sept 10 at 11 AM.

## 2022-11-27 ENCOUNTER — Ambulatory Visit (INDEPENDENT_AMBULATORY_CARE_PROVIDER_SITE_OTHER): Payer: Commercial Managed Care - HMO | Admitting: Family Medicine

## 2022-11-27 ENCOUNTER — Encounter: Payer: Self-pay | Admitting: Family Medicine

## 2022-11-27 VITALS — Ht 63.0 in | Wt 199.0 lb

## 2022-11-27 DIAGNOSIS — Z713 Dietary counseling and surveillance: Secondary | ICD-10-CM | POA: Diagnosis not present

## 2022-11-27 NOTE — Patient Instructions (Addendum)
PT exercises:  Link them with normal activities you do during the day.  Until this become routine for you, place reminders in sight, and make a rule for yourself, e.g., no lunch unless PT exercises are done.    Remember that nuts are a good source of polyunsaturated (and some, monounsaturated) fat, which are helpful to lower LDL cholesterol!  Where can you add some nuts/seeds to your diet?  (Remember that a serving is ~2 tbsp; a full cup on average provides ~800 calories.)  Food satisfaction: When you are planning your meal, try to include at least 3 separate elements, which may be more satisfying to you, and help avoid the problem of feeling less than satisfied psychologically.   - When planning your meal, think about maximizing those foods you know are especially nutrient-dense, and minimizing those that are more calories than nutrition.    Make a list of snack options for both at home and away, e.g., mini Kind bars, cheese sticks, BabyBel cheeses.    _____________________  1. Use the Food Decisions Algorithm when you are feeling stressed and leaning toward food.  This is also a good time to ask yourself those decoding Qs:  "Decoding" questions:     - What am I feeling right now?     - What do I want to feel?     - What do I truly need right now? Use the picture of the Feelings and Needs lists on your phone.   NOTE: If you get to the point of that downward spiral, you are much less likely to be able to accurately analyze what led to the choices you've made or the thoughts you have.   NOTE: When you first start using these questions, it's likely to be retrospectively rather than in the moment.  For example, last night: You were feeling: Anxiety, anger, frustration, hurt, ostracized, different, sad,.  You wanted to feel:  Relaxed, happy, peaceful, sleepy, loved, accepted, understood.  What you needed:  A hug, an "escape" activity to distract, to stop self-judging, hot bath, a call to a friend.    2. Include vegetables (at least 2 cups per meal) in at least 10 meals per week.       - Make a list of veg recipes you'd like to try.  Use this list as a basis for shopping.       - Keep in the house fruits you especially for snacks.    3. Physical activity: Get at least 15 minutes 3 days per week of any type of physical activity.     Document progress on your goals in your schedule book.  Highlight your check marks (and add stickers).    Follow-up appt on Tuesday, Oct 8 at 10 AM.  BRING YOUR DOCUMENTATION!

## 2022-11-27 NOTE — Progress Notes (Signed)
Medical Nutrition Therapy PCP Stephanie Rowan, MD Therapists [facility] Psychiatrist Milagros Evener, MD Appt start time: 1600 end time: 1700 (1 hour) Primary concerns today: Referred by Stephanie Rowan, MD for MNT related to preDM, obesity, HLD, and eating D/O.  Diagnoses also include bipolar, hypothyroid, and ex-induced asthma.   Relevant history/background: At age 42, in response to a chaotic home and her parents' divorce, Stephanie [name] started binge-eating sugar.  By 3rd grade, she started restricting, which worsened at puberty.  At age 35, anorexia was extreme, and she lost 25 lb in a month.  She started medication for bipolar D/O before age 20, and then started binge-eating sugary foods again and gained a lot of weight.  She lost to 150s in early 30s, before getting pregnant with her daughter.  Lost pregnancy weight using [employer] again after the birth of her daughter, and went through a manic phase where weight loss was maintained.  She started seeing [facility] ([facility] for body dysmorphia in Sept 2021, after which she reintroduced sugar to her diet.  With a family hx of diabetes, she had avoided sugar for the previous 20 years, but last fall's sugar "allowance" led to frequent intake of sugary foods and weight gain.  Stephanie [name] wants to be able to maintain moderation with respect to diet, while also reducing her DM risk and stabilizing weight and appetite.  Prior to getting her own therapy re. body image, Stephanie [name]'s 9-YO daughter started seeing a therapist for concerns about disordered eating, but is doing pretty well right now.  Stephanie [name]'s mom has her own disordered eating, and poor appetite control.  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  Currently tracking food intake with [employer] app.  She lives with her 9-YO daughter and husband.   Assessment:   Stephanie [name] is now working from 2:50 to 6 PM Mon thru Friday and 7:30-8 AM on Mon and Tues.  This requires advance preparation  for dinners or later meals, but has been manageable so far.  She has made an effort to reduce fat intake, especially saturated fats, and is eating more salmon.  She is also using higher fiber bread with seeds (Dave[employer] Killer bread).  She has not incorporated beans to her diet yet; recent antibiotics related to gingival surgery gave her an upset stomach.   Stephanie [name] has made an effort to reconnect with friends, having again realized this need when she has answered the Decoding Qs.    Labs on 10/23/22: TC 245; HDL 80; LDL 151; TG 84.  To be re-checked in October.    Weight: 199.0 lb; (202.2 lb on 10/23/22; ht is 63".)  (209 lb in April 2023.)  Usual eating pattern:  Most days getting 3 meals and 2 snacks.  Sleep: Estimates 10 hrs/night, and napping 2-3 hr 3 X wk (usually 9:30-noon).  (Dr. Evelene Croon decreased Zyprexa from 10 to 7.5, which has helped her feel less sleepy.) Weekly meal planning: Planning dinners (and lunches from dinner leftovers) weekly.   Weekday lunch prep:  Doing regularly.   Progress on Goals:   - Decoding Qs:  Used ~2 X wk, but not writing answers.  - Vegetable intake:  10 meals/wk up until last week; fresh veg's froze in refrig.  - Phys act 3 X wk:   Occasional "Just Dance" with daughter. Has realized she needs to walk right after dropping daughter off at school.  Not doing home PT exercises.    24-hr recall:  (Up at 6:15  AM) B (7 AM)-  1 c Cheerios, 1/2 c fat-free lactaid milk, water Snk ( AM)-  --- L (12:30 PM)-  2 eggs, 2 slc bread, 2 tsp soy-fee margarine, water Snk (3:45)-  1/4 c mini garlic club crackers, 2 apple slices, 1 cheese stick, water Snk (6 PM)-  1 [place] Double-dark cookie (70 kcal)  D (6:15 PM)-  1 1/2 c homemade (low-fat) chx mac&chs, 1 c raw spinach, water Snk (8 PM)-  1 spoonful ice cream Typical day? Yes.     Nutritional Diagnosis: No progress on NB-2.1 Physical inactivity as related to energy intake as evidenced by no planned exercise routine, and only sporadic  activity.    Handouts given during visit include: After-Visit Summary (AVS)  Monitoring/Evaluation:  Dietary intake, exercise, and body weight in 4 week(s).

## 2022-12-24 NOTE — Progress Notes (Unsigned)
Medical Nutrition Therapy PCP Stephanie Rowan, MD Therapists [name] Psychiatrist Stephanie Evener, MD Appt start time: 1000 end time: 1100 (1 hour) Primary concerns today: Referred by Stephanie Rowan, MD for MNT related to preDM, obesity, HLD, and eating D/O.  Diagnoses also include bipolar, hypothyroid, and ex-induced asthma.   Relevant history/background: At age 42, in response to a chaotic home and her parents' divorce, Stephanie [name] started binge-eating sugar.  By 3rd grade, she started restricting, which worsened at puberty.  At age 89, anorexia was extreme, and she lost 25 lb in a month.  She started medication for bipolar D/O before age 58, and then started binge-eating sugary foods again and gained a lot of weight.  She lost to 150s in early 30s, before getting pregnant with her daughter.  Lost pregnancy weight using [employer] again after the birth of her daughter, and went through a manic phase where weight loss was maintained.  She started seeing Stephanie [name] (Stephanie [name]) for body dysmorphia in Sept 2021, after which she reintroduced sugar to her diet.  With a family hx of diabetes, she had avoided sugar for the previous 20 years, but last fall's sugar "allowance" led to frequent intake of sugary foods and weight gain.  Stephanie [name] wants to be able to maintain moderation with respect to diet, while also reducing her DM risk and stabilizing weight and appetite.  Prior to getting her own therapy re. body image, Stephanie [name]'s 9-YO daughter started seeing a therapist for concerns about disordered eating, but is doing pretty well right now.  Stephanie [name]'s mom has her own disordered eating, and poor appetite control.  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  She lives with her 10-YO daughter and husband.   Assessment:   Stephanie [name] has not increased beans as yet.   Has included 3 meal components in some meals.  Fatigue has increased in past  month, due in part Stephanie [name] believes to feeling ashamed re. working only part-time now.   We explored obstacles to exercise today, and established a few ways in which Stephanie [name] can help herself follow through with intentions (see Pt Instrxns).    Labs on 10/23/22: TC 245; HDL 80; LDL 151; TG 84.  To be re-checked in this month.    Weight: 203.8 lb; (199.0 lb on 11/27/22; ht is 63".)  (209 lb in April 2023.)  Usual eating pattern:  Most days getting 3 meals and 2 snacks.  Sleep: Estimates 8-9 hrs/night, and napping 3-4 hr 3 X wk.  Weekly meal planning: Planning dinners (and lunches from dinner leftovers) weekly.   Weekday lunch prep:  Doing regularly.   Progress on Goals:   - Decoding Qs:  Still not writing answers, but has recognized and acted on need to reach out to friends more.    - Vegetable intake:  ~8 meals/wk; usually having carrots or broc at lunch, salad for for dinner.    - Phys act 3 X wk:   Met goal 1 week, including occasional "Just Dance."  Did PT ex's and walked right after dropping daughter at school the first week, which has fallen off since.  (Counted several cleaning activities as PA.)   24-hr recall:  (Up at 6 AM) B (7 AM)-  1 c Cheerios, 2 tbsp nuts & dried fruit, 6 oz fat-free lactose-free milk, water -- Back to bed 8:30-12:30 PM --   Snk ( AM)-  --- L (1 PM)-  8-10 tortilla chips, 1/4 c cheese, 1 1/2 c baby carrots, 1-2 tbsp ranch dip, 1/2 c ice cream, water Snk (4 PM)-  1 small slc cake, water D (7 PM)-  1 large slc Alfredo chx lasagne (incl's carrots&mushrooms), water Snk (8 PM)-  1/4 c ice cream, water Typical day? No.  Cake was unusual (served at after-school).    Nutritional Diagnosis: Minimal progress on NB-2.1 Physical inactivity as related to energy intake as evidenced again by no planned exercise routine, and only sporadic activity.    Handouts given during visit include: After-Visit Summary (AVS)  Monitoring/Evaluation:  Dietary intake, exercise, and body  weight in 4 week(s).

## 2022-12-25 ENCOUNTER — Ambulatory Visit (INDEPENDENT_AMBULATORY_CARE_PROVIDER_SITE_OTHER): Payer: Managed Care, Other (non HMO) | Admitting: Family Medicine

## 2022-12-25 VITALS — Ht 63.0 in | Wt 203.8 lb

## 2022-12-25 DIAGNOSIS — Z713 Dietary counseling and surveillance: Secondary | ICD-10-CM

## 2022-12-25 NOTE — Patient Instructions (Addendum)
Make two lists: 1. Snack options for both at home and away, e.g., mini Kind bars, cheese sticks, BabyBel cheeses.  2. Upload some uplifting podcasts you would like to try out (for walking/exercise).  (A couple of suggestions: Jeremy Johann' Wiser than Me, This American Life, and The Re-watchables.)  Emphasis: Remember the 3 meal components for optimal satisfaction.    Make a diagram with intersecting double-sided arrows that are vertical and horizontal, labeled Draining or Energizing and My Priorities and Others' Priorities.  Add your regular tasks where they fall in the grid.    1. Identify at least 1 activity that is high on your priorities and energizing area of the grid that you can do.   2. Bring to follow-up (and you may want to discuss with Abel Presto.)  Goals on which to document progress:  1. Include vegetables (at least 2 cups per meal) in at least 10 meals per week.       - Make a list of veg recipes you'd like to try.  Use this list as a basis for shopping.       - Keep in the house fruits you especially like for snacks.     2. Physical activity: Get at least 15 minutes 3 days per week of any type of physical activity.       - Bring water bottle in the car with you each morning.       - Keep a back-up water bottle in the car.       - Find head phones, charge them, and place those in the car.       - Look into [employer] classes.       - Use the 5-min rule of exercise: Promise yourself at least 5 min, & if you really don't want to continue, feel free to quit.     Document progress on your goals in your schedule book.  Highlight your check marks (and add stickers).     Follow-up appt on Thursday, Nov 7 at 11 AM.

## 2023-01-21 NOTE — Progress Notes (Unsigned)
Medical Nutrition Therapy PCP Stephanie Rowan, MD Therapists Stephanie [name] Psychiatrist Stephanie Evener, MD Appt start time: 1100 end time: 1200 (1 hour) Primary concerns today: Referred by Stephanie Rowan, MD for MNT related to preDM, obesity, HLD, and eating D/O.  Diagnoses also include bipolar, hypothyroid, and ex-induced asthma.   Relevant history/background: At age 42, in response to a chaotic home and her parents' divorce, Stephanie [name] started binge-eating sugar.  By 3rd grade, she started restricting, which worsened at puberty.  At age 89, anorexia was extreme, and she lost 25 lb in a month.  She started medication for bipolar D/O before age 72, and then started binge-eating sugary foods again and gained a lot of weight.  She lost to 150s in early 30s, before getting pregnant with her daughter.  Lost pregnancy weight using [employer] again after the birth of her daughter, and went through a manic phase where weight loss was maintained.  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  With a family hx of diabetes, she had avoided sugar for the previous 20 years, but last fall's sugar "allowance" led to frequent intake of sugary foods and weight gain.  Stephanie [name] wants to be able to maintain moderation with respect to diet, while also reducing her DM risk and stabilizing weight and appetite.  Prior to getting her own therapy re. body image, Bari's 11-YO daughter has seen a therapist for concerns about disordered eating, but is doing well now.  Stephanie [name]'s mom has her own disordered eating, and poor appetite control.  Stephanie [name] lives with her 11-YO daughter and husband.   Assessment:   Stephanie [name] ***.    Labs on 10/23/22: TC 245; HDL 80; LDL 151; TG 84.  Values from October: ***.     Weight: *** lb; (203.8 lb on 12/25/22; ht is 63".)  (209 lb in April 2023.)  Usual eating pattern:  Most days getting 3 meals and 2 snacks.  Sleep:  Estimates ***8-9 hrs/night, and napping 3-4 hr 3 X wk.  Weekly meal planning: Planning dinners (and lunches from dinner leftovers) weekly.   Weekday lunch prep:  Doing regularly.   Progress on Goals:   - Decoding Qs:  ***Still not writing answers, but has recognized and acted on need to reach out to friends more.    - Vegetable intake:  ***8 meals/wk; usually having carrots or broc at lunch, salad for for dinner.    - Phys act 3 X wk:   *** X wk.  "Just Dance"***.  PT ex's ***.  Walking ***.  24-hr recall suggests intake of *** kcal:  (Up at  AM) B ( AM)-   Snk ( AM)-   L ( PM)-   Snk ( PM)-   D ( PM)-   Snk ( PM)-   Typical day? {yes no:314532}   Nutritional Diagnosis: *** progress on NB-2.1 Physical inactivity as related to energy intake as evidenced again by *** exercise.  ***  Handouts given during visit include: After-Visit Summary (AVS)  Monitoring/Evaluation:  Dietary intake, exercise, and body weight in 4 week(s).    From 10/8*** Make two lists: 1. Snack options for both at home and away, e.g., mini Kind bars, cheese sticks, BabyBel cheeses.  2. Upload some uplifting podcasts you would like to try out (for walking/exercise).  (A couple of suggestions: Jeremy Johann' Wiser than Me, This American Life, and The Re-watchables.)   Emphasis: Remember the  3 meal components for optimal satisfaction.     Make a diagram with intersecting double-sided arrows that are vertical and horizontal, labeled Draining or Energizing and My Priorities and Others' Priorities.  Add your regular tasks where they fall in the grid.    1. Identify at least 1 activity that is high on your priorities and energizing area of the grid that you can do.   2. Bring to follow-up (and you may want to discuss with Stephanie [name].)   Goals on which to document progress:  1. Include vegetables (at least 2 cups per meal) in at least 10 meals per week.       - Make a list of veg recipes you'd like to try.  Use  this list as a basis for shopping.       - Keep in the house fruits you especially like for snacks.     2. Physical activity: Get at least 15 minutes 3 days per week of any type of physical activity.       - Bring water bottle in the car with you each morning.       - Keep a back-up water bottle in the car.       - Find head phones, charge them, and place those in the car.       - Look into [employer] classes.       - Use the 5-min rule of exercise: Promise yourself at least 5 min, & if you really don't want to continue, feel free to quit.     Document progress on your goals in your schedule book.  Highlight your check marks (and add stickers).     Follow-up appt on ***.

## 2023-01-22 ENCOUNTER — Ambulatory Visit (INDEPENDENT_AMBULATORY_CARE_PROVIDER_SITE_OTHER): Payer: 59 | Admitting: Family Medicine

## 2023-01-22 VITALS — Ht 63.0 in | Wt 208.4 lb

## 2023-01-22 DIAGNOSIS — E7800 Pure hypercholesterolemia, unspecified: Secondary | ICD-10-CM

## 2023-01-22 DIAGNOSIS — R7303 Prediabetes: Secondary | ICD-10-CM

## 2023-01-22 DIAGNOSIS — Z713 Dietary counseling and surveillance: Secondary | ICD-10-CM | POA: Diagnosis not present

## 2023-01-22 NOTE — Patient Instructions (Addendum)
Comments:  - Snacks list:  Look for a version of microwave popcorn that comes in an individual-portion bag.  - Although you can find some decent protein bars or other snack products, usually it's hard to beat (nutritionally) fresh fruit or other whole, real foods.  - Beware some carb foods: When you are drawn to something sweet, it's often not related to physical hunger.  Pay attention to how any sweet-tasting food affects your appetite.   - Your weekly breakfast-for-dinner:  Include a side of fruit.  A delicious side to this meal would be applesauce or a fruit compote.    Your fear (negative thought): "If I exert control over my food choices, it will spiral into excessive control/rigidity."   Ask yourself: Is this true?  Can you be sure (100% sure) that this statement is true.  How do you feel when you believe this statement?  Describe who you would be without this statement/thought.  How might the lack of this thought affect your food choices?      - Remember that negative thoughts prompt negative feelings, which then tend to drive certain behaviors.  If you have negative feelings, all too often your cognition is compromised.  For example, how well do you make decisions when you feel very angry or anxious/fearful or depressed?  Strong, negative feelings hijack our cognitive abilities.  Why this matters is that it then diminishes your ability to learn from mistakes or to make a plan to help you avoid future regretted behaviors.      - The goals in these moments of negative thinking are to (a) recognize what is going on in your head and in your body, and (b) press the PAUSE button, and take that step back to ask yourself, "what is going on right now?; what led to this thought?; and what's my plan going forward?"   Reminder: Remember the 3 separate meal components for optimal satisfaction.     Add to your calendar a date to re-do your diagram (December date?) for Draining or Energizing and My Priorities  and Others' Priorities.     Goals on which to document progress:  1. Include vegetables (at least 2 cups per meal) in at least 10 meals per week.       - Make a list of veg recipes you'd like to try.  Use this list as a basis for shopping.       - Keep in the house fruits you especially like for snacks.     2. Physical activity: Get at least 150 minutes per week of any type of physical activity.       - Bring water bottle in the car with you each morning.       - Keep a back-up water bottle in the car.       - Find head phones, charge them, and place those in the car.       - Look into [employer] classes.       - Use the 5-min rule of exercise: Promise yourself at least 5 min, & if you really don't want to continue, feel free to quit.     Document progress on your goals in your schedule book.     Follow-up appt: Monday, Dec 2 at 11 AM.   Further f/u: Call [facility] ([facility]): [phone].  Dietitian Mirian Capuchin is certified as a specialist in eating D/Os. They are in the [facility] (4th  floor), [address]

## 2023-01-24 ENCOUNTER — Ambulatory Visit: Payer: Managed Care, Other (non HMO) | Admitting: Family Medicine

## 2023-01-29 ENCOUNTER — Ambulatory Visit: Payer: Managed Care, Other (non HMO) | Admitting: Gastroenterology

## 2023-01-29 ENCOUNTER — Encounter: Payer: Self-pay | Admitting: Gastroenterology

## 2023-01-29 VITALS — BP 110/70 | HR 61 | Ht 63.0 in | Wt 209.0 lb

## 2023-01-29 DIAGNOSIS — K219 Gastro-esophageal reflux disease without esophagitis: Secondary | ICD-10-CM

## 2023-01-29 DIAGNOSIS — R1319 Other dysphagia: Secondary | ICD-10-CM

## 2023-01-29 DIAGNOSIS — R194 Change in bowel habit: Secondary | ICD-10-CM

## 2023-01-29 NOTE — Progress Notes (Signed)
[facility] Progress Note  Chief Complaint: GERD and dysphagia  Subjective  Prior history Seen previously for abdominal pain, heartburn, dysphagia and rectal bleeding with alternating constipation and diarrhea EGD and colonoscopy 04/30/2022 Widely patent esophageal Schatzki ring, small hiatal hernia, no esophagitis.  Gastric mucosa normal and biopsied.  Duodenal mucosa normal and biopsied (all biopsies normal and pathology reports on file) Afterward, a lactulose breath test was recommended to evaluate for possible SIBO.  Colonoscopy to the terminal ileum with normal ileal and colonic mucosa, biopsies negative for microscopic colitis.  No polyps.  Left-sided diverticulosis, normal anorectal exam  Followed up with APP on 06/18/2022, reported intolerance of Linzess and Trulance.  Patient had reported that her sick significant improvement in symptoms with some dietary changes and transitioning off Humira meant that she felt the breath testing was no longer needed.  Discussed the use of AI scribe software for clinical note transcription with the patient, who gave verbal consent to proceed.  History of Present Illness   The patient, previously diagnosed with gastroesophageal reflux disease (GERD), reports a recent change in medication from pantoprazole to famotidine. The change was initiated due to primary care concerns about long-term pantoprazole use. However, the patient experienced severe constipation and esophageal burning within two days of starting famotidine, prompting them to discontinue the medication. They resumed pantoprazole for a day to alleviate symptoms and have since been managing symptoms with Tums most days. The patient is seeking guidance on whether to continue pantoprazole intermittently or rely solely on Tums.  While on pantoprazole, the patient reported significant relief from heartburn and swallowing difficulties. These improvements persisted throughout the course of the  medication. The patient also reported normalization of bowel habits, except for the brief period on famotidine. This improvement is attributed to changes in other medications, including discontinuation of Humira and adjustments in bipolar medications. Specifically, the patient noted less constipation with reduced doses of Zyprexa, which was being tapered to increase the dose of Caplyta. The patient also reported weight gain and elevated cholesterol levels, suspected to be side effects of Zyprexa.  The patient experiences periodic heartburn, requiring Tums almost daily. The frequency of symptoms varies, with some weeks experiencing daily heartburn and others having symptom-free days. The patient denies any new medications and reports adherence to current prescriptions.       ROS: Cardiovascular:  no chest pain Respiratory: no dyspnea  The patient's Past Medical, Family and Social History were reviewed and are on file in the EMR. Past Medical History:  Diagnosis Date   Acid reflux    Anxiety    Arthritis    Asthma    Bipolar depression (HCC)    Carpal tunnel syndrome    Depression    Hypothyroid    IBS (irritable bowel syndrome)    Normal pregnancy, first 07/27/2011   Sinusitis    SVD (spontaneous vaginal delivery) 07/28/2011   SVD (spontaneous vaginal delivery) 07/28/2011    Past Surgical History:  Procedure Laterality Date   extraction of wisdom teeth       Objective:  Med list reviewed  Current Outpatient Medications:    Aflibercept (EYLEA) 2 MG/0.05ML SOSY, by Intravitreal route., Disp: , Rfl:    cholecalciferol (VITAMIN D3) 25 MCG (1000 UNIT) tablet, Take 5 Units by mouth daily., Disp: , Rfl:    fexofenadine (ALLEGRA ALLERGY) 180 MG tablet, Take 180 mg by mouth daily., Disp: , Rfl:    FLUoxetine (PROZAC) 20 MG capsule, Take 20 mg by mouth  daily., Disp: , Rfl:    fluticasone (VERAMYST) 27.5 MCG/SPRAY nasal spray, Place 2 sprays into the nose daily., Disp: , Rfl:     levonorgestrel (MIRENA, 52 MG,) 20 MCG/DAY IUD, Take 1 device by intrauterine route., Disp: , Rfl:    levothyroxine (SYNTHROID) 100 MCG tablet, Take 100 mcg by mouth once a week., Disp: , Rfl:    levothyroxine (SYNTHROID) 112 MCG tablet, Take 112 mcg by mouth daily before breakfast., Disp: , Rfl:    lumateperone tosylate (CAPLYTA) 10.5 MG capsule, Take 42 mg by mouth daily., Disp: , Rfl:    ofloxacin (OCUFLOX) 0.3 % ophthalmic solution, Apply to eye., Disp: , Rfl:    OLANZapine (ZYPREXA) 10 MG tablet, Take 7.5 mg by mouth at bedtime. Pt taking 5 mg, Disp: , Rfl:    RINVOQ 15 MG TB24, Take 1 tablet by mouth daily., Disp: , Rfl:    pantoprazole (PROTONIX) 20 MG tablet, TAKE 1 TABLET BY MOUTH DAILY BEFORE BREAKFAST (Patient not taking: Reported on 01/29/2023), Disp: 30 tablet, Rfl: 5   Vital signs in last 24 hrs: Vitals:   01/29/23 1003  BP: 110/70  Pulse: 61   Wt Readings from Last 3 Encounters:  01/29/23 209 lb (94.8 kg)  01/22/23 208 lb 6.4 oz (94.5 kg)  12/25/22 203 lb 12.8 oz (92.4 kg)    Physical Exam  Well-appearing  Cardiac: Regular without appreciable murmur,  no peripheral edema Pulm: clear to auscultation bilaterally, normal RR and effort noted Abdomen: soft, no tenderness, with active bowel sounds. No guarding or palpable hepatosplenomegaly.   Labs:   ___________________________________________ Radiologic studies:   ____________________________________________ Other:   _____________________________________________   Encounter Diagnoses  Name Primary?   Gastroesophageal reflux disease without esophagitis Yes   Altered bowel habits    Esophageal dysphagia    Nonerosive reflux while on PPI.  Significant worsening of symptoms off acid suppression, intolerant of H2 blocker.  Esophageal dysphagia was some reflux related dysmotility. Assessment and Plan    Gastroesophageal Reflux Disease (GERD) Improved symptoms with Pantoprazole 20mg , but discontinued due to  concerns about long-term use. Unsuccessful trial of Famotidine due to constipation and worsening of GERD symptoms. Currently managing symptoms with Tums.  I have reviewed the indications, risks, and benefits of PPI therapy with the patient today. I have discussed studies that raise ? of increased osteoporosis, and kidney failure and explained that these studies show very weak associations of unclear significance and not clear cause and effect. We did discuss the potential for vitamin malabsorption, to include magnesium (very rare), calcium (easily modifiable with Calcium Citrate supplement), vitamin B12 (again, correctable with oral B12 supplement), and iron (although rarely clinically significant outside patients who require iron supplementation previously), and can monitor each of these periodically with routine labs. We have agreed to continue PPI treatment in this case.  Overall, I think the periodic use of low-dose pantoprazole is relatively low risk and provided significant relief from her reflux symptoms.  I also discussed the need for diet and lifestyle measures as being just as important as medicines to control reflux. -Resume Pantoprazole 20mg  as needed for symptom control. -Consider daily calcium supplement for potential calcium malabsorption due to PPI use. -Continue lifestyle modifications for GERD management.  See me as needed  25 minutes were spent on this encounter (including chart review, history/exam, counseling/coordination of care, and documentation) > 50% of that time was spent on counseling and coordination of care.   Charlie Pitter III

## 2023-02-18 ENCOUNTER — Ambulatory Visit (INDEPENDENT_AMBULATORY_CARE_PROVIDER_SITE_OTHER): Payer: Commercial Managed Care - HMO | Admitting: Family Medicine

## 2023-02-18 VITALS — Ht 63.0 in | Wt 210.8 lb

## 2023-02-18 DIAGNOSIS — Z713 Dietary counseling and surveillance: Secondary | ICD-10-CM

## 2023-02-18 NOTE — Patient Instructions (Signed)
Managing negative thoughts using Paschal Dopp process, for example, "If I exert control over my food choices, it will spiral into excessive control/rigidity."  Ask yourself: Is this true?  Can you be sure (100% sure) that this statement is true.  How do you feel when you believe this statement?  Describe who you would be without this statement/thought.  How might the lack of this thought affect your food choices?      - Negative thoughts prompt negative feelings, which then can drive certain behaviors.  Strong, negative feelings hijack our cognitive abilities.  How well do you make decisions when you feel very angry or anxious/fearful or depressed?  Why this matters is that it diminishes your ability to learn from mistakes or to make a plan to help you avoid future regretted behaviors.      - The goals in these moments of negative thinking are to (a) recognize what is going on in your head and in your body, and (b) press the PAUSE button, and take that step back to ask yourself, "what is going on right now?; what led to this thought?; and what's my plan going forward?"  Consider branching out on the vegetables you eat, and ways of preparing them.  This will likely translate into your eating (and enjoying) more vegetables!  Review the vegetables list provided today, and determine some new veg's to try.    Holidays: Identify anticipated problem people, situations, or foods, and make a game plan for managing these "problems."   Remember to acknowledge and optimize even the anticipatory part of eating, which adds to our overall food satisfaction.  Consider not so much what is quick or convenient, but what is satisfying.   Pay attention to (and work on establishing) food norms that serve your best interest and that of your family.  For example, lunch on weekends is just as important as on other days of the week.    Factors that promotes regretted food choices:   - Having too much/many sweet foods on hand.   (Why bring it into the house; what foods do you want to be sure to have on hand, e.g., fruit?) - Delaying meal times.  (You want to avoid getting over-hungry, especially if food shopping!) - Not meeting emotional needs via talking (communicating) as needed.   - Enjoying bonding with Hope, e.g., make cinnamon rolls together.    Goals remain the same: 1. Include vegetables (at least 2 cups per meal) in at least 10 meals per week.       - Make a list of veg recipes you'd like to try.  Use this list as a basis for shopping.       - Keep in the house fruits you especially like for snacks.     2. Physical activity: Get at least 150 minutes per week of any type of physical activity.  (Write # of minutes in your book, and tally total minutes at end of week.)      - Bring water bottle in the car with you each morning.       - Keep a back-up water bottle in the car.       - Find head phones, charge them, and place those in the car.       - Look into [employer] classes.       - Use the 5-min rule of exercise: Promise yourself at least 5 min, & if you really don't want to continue, feel free  to quit.    Document progress in your schedule book.  Re-read these recommendations daily for the next two weeks, and assess:  What are ways I can respond more with respect each (or some) of these recommendations?  What will my focus be today?  Follow-up: Since your insurance won't cover MNT with an RD at [facility], continue with [facility] point system, including checking in with your [facility] partner, and continue to work on the above strategies.

## 2023-02-18 NOTE — Progress Notes (Signed)
Medical Nutrition Therapy PCP [name], MD Therapist [facility] Psychiatrist Milagros Evener, MD Appt start time: 1100 end time: 1200 (1 hour) Primary concerns today: Referred by [name], MD for MNT related to preDM, obesity, HLD, and eating D/O.  Diagnoses also include bipolar, hypothyroid, and ex-induced asthma.   Relevant history/background: At age 42, in response to a chaotic home and her parents' divorce, Stephanie [name] started binge-eating sugar.  By 3rd grade, she started restricting, which worsened at puberty.  At age 48, anorexia was extreme, and she lost 25 lb in a month.  She started medication for bipolar D/O before age 54, and then started binge-eating sugary foods again and gained a lot of weight.  She lost to 150s in early 30s, before getting pregnant with her daughter.  Lost pregnancy weight using [employer] again after the birth of her daughter, and went through a manic phase where weight loss was maintained.  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  With a family hx of diabetes, she had avoided sugar for the previous 20 years, but last fall's sugar "allowance" led to frequent intake of sugary foods and weight gain.  Stephanie [name] wants to be able to maintain moderation with respect to diet, while also reducing her DM risk and stabilizing weight and appetite.  Prior to getting her own therapy re. body image, Stephanie [name]'s 11-YO daughter had seen a therapist for concerns about disordered eating, but is doing well now.  Stephanie [name]'s mom has her own disordered eating, and poor appetite control.  Stephanie [name] lives with her 11-YO daughter and husband.   Assessment:   Stephanie [name] was disappointed in some of her food choices over the Thanksgiving holiday.  She feels her choice to eat pecan pie led to sweets cravings (e.g., ice cream and cinnamon rolls yesterday).  We reviewed yesterday's food intake and the factors that led  to regretted choices (see Patient Instrxns).  I also encouraged Stephanie [name] to give some thought to the remaining events of the holiday season, and to anticipate potential ways in which she might be influenced to make what will become regretted choices.   Dr. Evelene Croon has prescribed discontinuing Prozac, weaning off of Zyprexa (currently at 2.5 mg), and substituting Caplyta (currently at 10.5 mg).  She has not had to nap daily since making this recent change.    Weight: 210.8 lb; (208.4 lb on 01/22/23; ht is 63".)  (209 lb in April 2023.)   Usual eating pattern:  3 meals and 2 snacks most days.  Sleep: Estimates 8-9 hrs/night, and no napping.   Progress on Goals:   - Vegetable intake:   Meeting goal at 10 meals/wk.    - Phys act 150 min/wk:   Zumba/walked ~2 X wk; averaged ~100 minutes wk.    24-hr recall:  (Up at 8 AM; water) B (8:30 AM)-  1 1/2 c Corn Chex, 1 c fat-free lactose-free milk Snk ( AM)-  --- L (1 PM)-  1 pint ice cream, 1 c tortilla chips, 1 oz cheese, water Snk (3 PM)-  2 large cinnamon rolls, water D (5:30 PM)-  1 large svng chx alfredo lasagne, side salad, 1 tbsp Caesar dressing, water Snk ( PM)-  --- Typical day? No.  Earned 74 points (target is 23).    Nutritional Diagnosis: Stable progress noted on NB-2.1 Physical inactivity as related to energy intake as evidenced by about the same amount of exercise since  last appt (approaching goal).  Handouts given during visit include: After-Visit Summary (AVS)  Monitoring/Evaluation:    Patient will not f/u with [facility] after my retirement, as her insurance does not provide coverage for MNT in an out-pt setting .

## 2023-03-27 ENCOUNTER — Ambulatory Visit: Payer: 59 | Admitting: Registered"

## 2023-08-19 ENCOUNTER — Other Ambulatory Visit: Payer: Self-pay | Admitting: Family Medicine

## 2023-08-19 DIAGNOSIS — Z1231 Encounter for screening mammogram for malignant neoplasm of breast: Secondary | ICD-10-CM

## 2023-09-24 ENCOUNTER — Ambulatory Visit
Admission: RE | Admit: 2023-09-24 | Discharge: 2023-09-24 | Disposition: A | Discharge status: Home / Self Care | Source: Ambulatory Visit | Attending: Family Medicine | Admitting: Family Medicine

## 2023-09-24 DIAGNOSIS — Z1231 Encounter for screening mammogram for malignant neoplasm of breast: Secondary | ICD-10-CM
